# Patient Record
Sex: Female | Born: 1975
Health system: Southern US, Community
[De-identification: ages and names within clinical notes are randomized; demographics above are authoritative.]

## PROBLEM LIST (undated history)

## (undated) DIAGNOSIS — I10 Essential (primary) hypertension: Secondary | ICD-10-CM

## (undated) DIAGNOSIS — G43909 Migraine, unspecified, not intractable, without status migrainosus: Secondary | ICD-10-CM

---

## 1997-10-28 ENCOUNTER — Emergency Department (HOSPITAL_COMMUNITY): Admission: EM | Admit: 1997-10-28 | Discharge: 1997-10-28 | Payer: Self-pay | Admitting: Emergency Medicine

## 2000-12-25 ENCOUNTER — Inpatient Hospital Stay (HOSPITAL_COMMUNITY): Admission: AD | Admit: 2000-12-25 | Discharge: 2000-12-25 | Payer: Self-pay | Admitting: Obstetrics

## 2001-01-17 ENCOUNTER — Inpatient Hospital Stay (HOSPITAL_COMMUNITY): Admission: AD | Admit: 2001-01-17 | Discharge: 2001-01-17 | Payer: Self-pay | Admitting: Obstetrics

## 2001-01-18 ENCOUNTER — Encounter: Payer: Self-pay | Admitting: Obstetrics

## 2001-03-02 ENCOUNTER — Ambulatory Visit (HOSPITAL_COMMUNITY): Admission: RE | Admit: 2001-03-02 | Discharge: 2001-03-02 | Payer: Self-pay | Admitting: *Deleted

## 2001-04-01 ENCOUNTER — Inpatient Hospital Stay (HOSPITAL_COMMUNITY): Admission: AD | Admit: 2001-04-01 | Discharge: 2001-04-01 | Payer: Self-pay | Admitting: *Deleted

## 2001-07-19 ENCOUNTER — Inpatient Hospital Stay (HOSPITAL_COMMUNITY): Admission: AD | Admit: 2001-07-19 | Discharge: 2001-07-19 | Payer: Self-pay | Admitting: Obstetrics

## 2001-07-29 ENCOUNTER — Encounter (HOSPITAL_COMMUNITY): Admission: RE | Admit: 2001-07-29 | Discharge: 2001-08-08 | Payer: Self-pay | Admitting: *Deleted

## 2001-08-05 ENCOUNTER — Inpatient Hospital Stay (HOSPITAL_COMMUNITY): Admission: AD | Admit: 2001-08-05 | Discharge: 2001-08-05 | Payer: Self-pay | Admitting: *Deleted

## 2001-08-06 ENCOUNTER — Inpatient Hospital Stay (HOSPITAL_COMMUNITY): Admission: AD | Admit: 2001-08-06 | Discharge: 2001-08-08 | Payer: Self-pay | Admitting: *Deleted

## 2001-10-28 ENCOUNTER — Emergency Department (HOSPITAL_COMMUNITY): Admission: EM | Admit: 2001-10-28 | Discharge: 2001-10-28 | Payer: Self-pay | Admitting: Emergency Medicine

## 2004-01-10 ENCOUNTER — Inpatient Hospital Stay (HOSPITAL_COMMUNITY): Admission: AD | Admit: 2004-01-10 | Discharge: 2004-01-12 | Payer: Self-pay | Admitting: Obstetrics

## 2009-05-16 ENCOUNTER — Ambulatory Visit: Payer: Self-pay | Admitting: Obstetrics and Gynecology

## 2009-05-21 ENCOUNTER — Ambulatory Visit (HOSPITAL_COMMUNITY): Admission: RE | Admit: 2009-05-21 | Discharge: 2009-05-21 | Payer: Self-pay | Admitting: Obstetrics and Gynecology

## 2009-06-19 ENCOUNTER — Ambulatory Visit (HOSPITAL_COMMUNITY): Admission: RE | Admit: 2009-06-19 | Discharge: 2009-06-19 | Payer: Self-pay | Admitting: Obstetrics and Gynecology

## 2009-06-19 ENCOUNTER — Ambulatory Visit: Payer: Self-pay | Admitting: Obstetrics and Gynecology

## 2010-10-21 LAB — CBC
HCT: 40.6 % (ref 36.0–46.0)
Hemoglobin: 13.8 g/dL (ref 12.0–15.0)
MCHC: 33.9 g/dL (ref 30.0–36.0)
MCV: 88.8 fL (ref 78.0–100.0)
Platelets: 312 10*3/uL (ref 150–400)
RBC: 4.57 MIL/uL (ref 3.87–5.11)
RDW: 13.8 % (ref 11.5–15.5)
WBC: 6.5 10*3/uL (ref 4.0–10.5)

## 2010-10-21 LAB — PREGNANCY, URINE: Preg Test, Ur: NEGATIVE

## 2011-05-02 ENCOUNTER — Emergency Department (HOSPITAL_COMMUNITY)
Admission: EM | Admit: 2011-05-02 | Discharge: 2011-05-02 | Disposition: A | Discharge status: Home / Self Care | Payer: Self-pay | Attending: Emergency Medicine | Admitting: Emergency Medicine

## 2011-05-02 ENCOUNTER — Emergency Department (HOSPITAL_COMMUNITY): Payer: Self-pay

## 2011-05-02 DIAGNOSIS — G43909 Migraine, unspecified, not intractable, without status migrainosus: Secondary | ICD-10-CM | POA: Insufficient documentation

## 2012-04-08 ENCOUNTER — Ambulatory Visit: Payer: Self-pay | Admitting: Family Medicine

## 2012-04-08 VITALS — BP 118/74 | HR 83 | Temp 98.3°F | Resp 16 | Ht 62.0 in | Wt 177.0 lb

## 2012-04-08 DIAGNOSIS — J0110 Acute frontal sinusitis, unspecified: Secondary | ICD-10-CM

## 2012-04-08 DIAGNOSIS — R05 Cough: Secondary | ICD-10-CM

## 2012-04-08 DIAGNOSIS — Z23 Encounter for immunization: Secondary | ICD-10-CM

## 2012-04-08 MED ORDER — LORATADINE-PSEUDOEPHEDRINE ER 5-120 MG PO TB12: ORAL_TABLET | ORAL | Status: DC

## 2012-04-08 MED ORDER — BENZONATATE 100 MG PO CAPS: ORAL_CAPSULE | ORAL | Status: DC

## 2012-04-08 MED ORDER — AMOXICILLIN 500 MG PO CAPS: ORAL_CAPSULE | ORAL | Status: DC

## 2012-04-08 NOTE — Patient Instructions (Addendum)
1. Sinusitis, acute frontal  amoxicillin (AMOXIL) 500 MG capsule, loratadine-pseudoephedrine (CLARITIN-D 12 HOUR) 5-120 MG per tablet  2. Cough  benzonatate (TESSALON PERLES) 100 MG capsule  3. Need for prophylactic vaccination and inoculation against influenza  Flu vaccine greater than or equal to 36yo preservative free IM   Sinusitis Sinuses are air pockets within the bones of your face. The growth of bacteria within a sinus leads to infection. The infection prevents the sinuses from draining. This infection is called sinusitis. SYMPTOMS  There will be different areas of pain depending on which sinuses have become infected.  The maxillary sinuses often produce pain beneath the eyes.   Frontal sinusitis may cause pain in the middle of the forehead and above the eyes.  Other problems (symptoms) include:  Toothaches.   Colored, pus-like (purulent) drainage from the nose.   Swelling, warmth, and tenderness over the sinus areas may be signs of infection.  TREATMENT  Sinusitis is most often determined by an exam.X-rays may be taken. If x-rays have been taken, make sure you obtain your results or find out how you are to obtain them. Your caregiver may give you medications (antibiotics). These are medications that will help kill the bacteria causing the infection. You may also be given a medication (decongestant) that helps to reduce sinus swelling.  HOME CARE INSTRUCTIONS   Only take over-the-counter or prescription medicines for pain, discomfort, or fever as directed by your caregiver.   Drink extra fluids. Fluids help thin the mucus so your sinuses can drain more easily.   Applying either moist heat or ice packs to the sinus areas may help relieve discomfort.   Use saline nasal sprays to help moisten your sinuses. The sprays can be found at your local drugstore.  SEEK IMMEDIATE MEDICAL CARE IF:  You have a fever.   You have increasing pain, severe headaches, or toothache.   You have  nausea, vomiting, or drowsiness.   You develop unusual swelling around the face or trouble seeing.  MAKE SURE YOU:   Understand these instructions.   Will watch your condition.   Will get help right away if you are not doing well or get worse.  Document Released: 07/06/2005 Document Revised: 06/25/2011 Document Reviewed: 02/02/2007 [facility] Patient Information 2012 [place], [place].

## 2012-04-08 NOTE — Progress Notes (Signed)
   [address]   [place], [place]  [address]   [phone]  Subjective:    Patient ID: Sharon Johnson, female    DOB: June 22, 1976, 36 y.o.   MRN: [id number]  HPI This 36 y.o. female presents for evaluation of cough, rhinorrhea.  Onset ten days ago.  No fever +chills, +sweats.  +HA; L ear pain; +ST; +rhinorrhea; +nasal congestion; yellow drainage; +PND; +coughing; +nausea; no SOB; no vomiting but +dry heaves.  No diarrhea.  No rash.  +sneezing some.  Sudafed, Contact Cold and Flu and Mucinex Cold.   Diffuse sore throat; no pain with talking; mild pain with swallowing.    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    Review of Systems  Constitutional: Negative for fever, chills, diaphoresis and fatigue.  HENT: Positive for ear pain, congestion, sore throat, rhinorrhea, sneezing, voice change and postnasal drip. Negative for hearing loss and trouble swallowing.   Eyes: Negative for pain, discharge and itching.  Respiratory: Positive for cough. Negative for shortness of breath and wheezing.   Gastrointestinal: Positive for nausea. Negative for vomiting and diarrhea.  Neurological: Positive for dizziness and headaches. Negative for syncope, weakness, light-headedness and numbness.       Objective:   Physical Exam  Constitutional: She is oriented to person, place, and time. She appears well-developed and well-nourished.  HENT:  Head: Normocephalic and atraumatic.  Right Ear: External ear normal.  Left Ear: External ear normal.  Nose: Nose normal.  Mouth/Throat: Oropharynx is clear and moist. No oropharyngeal exudate.       B HYPERTROPHIED TONSILS WITHOUT DEVIATION OR EXUDATE.  Eyes: Conjunctivae normal and EOM are normal. Pupils are equal, round, and reactive to light.  Neck: Normal range of motion. Neck supple. No thyromegaly present.  Cardiovascular: Normal rate, regular rhythm,  normal heart sounds and intact distal pulses.   Pulmonary/Chest: Effort normal and breath sounds normal. No respiratory distress. She has no wheezes. She has no rales.  Lymphadenopathy:    She has cervical adenopathy.  Neurological: She is alert and oriented to person, place, and time. No cranial nerve deficit. She exhibits normal muscle tone.  Skin: Skin is warm and dry. No rash noted.  Psychiatric: She has a normal mood and affect. Her behavior is normal. Judgment and thought content normal.       Assessment & Plan:   1. Sinusitis, acute frontal  amoxicillin (AMOXIL) 500 MG capsule, loratadine-pseudoephedrine (CLARITIN-D 12 HOUR) 5-120 MG per tablet  2. Cough  benzonatate (TESSALON PERLES) 100 MG capsule  3. Need for prophylactic vaccination and inoculation against influenza  Flu vaccine greater than or equal to 3yo preservative free IM     1.  Acute frontal Sinusitis:  New.  Rx for Amoxicillin, Claritin D provided.  Recommend supportive care with rest, fluids, Tylenol or Motrin.   2.  Cough:  New.  Rx for [employer] provided.   3.  Flu vaccine administered.

## 2012-04-11 NOTE — Progress Notes (Signed)
Reviewed and agree.

## 2014-12-25 ENCOUNTER — Ambulatory Visit: Payer: Self-pay

## 2015-01-01 ENCOUNTER — Ambulatory Visit: Payer: Self-pay | Attending: Internal Medicine

## 2015-06-29 ENCOUNTER — Ambulatory Visit (INDEPENDENT_AMBULATORY_CARE_PROVIDER_SITE_OTHER): Payer: Self-pay | Admitting: Internal Medicine

## 2015-06-29 VITALS — BP 110/80 | HR 58 | Temp 98.8°F | Resp 16 | Ht 62.0 in | Wt 167.1 lb

## 2015-06-29 DIAGNOSIS — S46811A Strain of other muscles, fascia and tendons at shoulder and upper arm level, right arm, initial encounter: Secondary | ICD-10-CM

## 2015-06-29 DIAGNOSIS — J0100 Acute maxillary sinusitis, unspecified: Secondary | ICD-10-CM

## 2015-06-29 MED ORDER — AMOXICILLIN 875 MG PO TABS: 875.0000 mg | ORAL_TABLET | Freq: Two times a day (BID) | ORAL | Status: DC

## 2015-06-29 NOTE — Patient Instructions (Signed)
Ask pharmacy for 12hour sudafed to take twice a day for 5-7 daysParlisis del trapecio con rehabilitacin (Trapezius Palsy With Rehab) El trapecio es un msculo grande que se encuentra en la zona superior de la espalda y que ayuda a Chief Operating Officer el omplato y a Building services engineer columna vertebral. La parlisis del trapecio es una afeccin del sistema nervioso en el msculo trapecio. Esta afeccin determina dolor y debilidad en la parte posterior del hombro y la regin superior de la espalda. Tambin est comprometida la funcin del hombro, debido a que la escpula alberga la articulacin del hombro La parlisis del trapecio es causada por una lesin del nervio espinal accesorio, el cual se conecta con el msculo trapecio. SNTOMAS  Dolor continuo en el hombro o en la zona superior de la espalda.  Disminucin de la funcin del codo y de la fuerza.  El omplato protrude (omplato en forma de ala).  Dolor en la espalda al sentarse en una silla con respaldo duro, debido a la protrusin del omplato que presiona en la silla.  Atrofia del trapecio, en la que puede verse o no asimetra del cuello.  El hombro est cado. CAUSAS La causa es una lesin en el nervio accesorio, que se conecta con el msculo trapecio. Generalmente esta afeccin se asocia a Customer service manager (los huesos adyacentes salen de su alineacin, pero las superficies de las articulaciones estn en contacto). Los mecanismos ms frecuentes de una lesin son:  Engineer, technical sales (como al golpearse contra un objeto o caer sobre el hombro).  Complicaciones de [place] previa que causa una lesin en el nervio. LOS RIESGOS AUMENTAN CON:  En los deportes de contacto (ftbol americano, rugby).  Ciruga cerca del cuello.  Poca fuerza y flexibilidad. PREVENCIN  Precalentamiento adecuado y elongacin antes de la [place].  Mantener la forma fsica:  E[place], flexibilidad y resistencia  muscular.  Capacidad cardiovascular. PRONSTICO Esta afeccin generalmente se resuelve espontneamente dentro de los 3 a 6 meses. El tratamiento no quirrgico (conservador) puede ayudar a Conservator, museum/gallery intensidad de los sntomas.  COMPLICACIONES RELACIONADAS  Dao permanente que incluye dolor, adormecimiento, hormigueo o debilidad.  Imposibilidad de participar en competencias de alto nivel.  La recurrencia frecuente de los sntomas puede dar como resultado un problema crnico.  Rigidez del hombro. TRATAMIENTO El tratamiento inicial consiste en descansar de actividades que lo agravan y el uso de medicamentos y la aplicacin de hielo para Engineer, materials y reducir la hinchazn. Los ejercicios de elongacin y fortalecimiento pueden ayudar a reducir Chief Technology Officer que aparece con la [place]. Los ejercicios pueden Management consultant o con un terapeuta. Un terapeuta podr indicarle un tratamiento ms intensivo:  El uso de corriente elctrica para Risk manager los nervios (estimulacin nerviosa elctrica transcutnea - TENS)  Ultrasonido Si los sntomas son graves, el mdico podr indicarle que utilice un soporte ortopdico para Paramedic las [place]. Si los sntomas continan durante ms de 6 meses a pesar del tratamiento no quirrgico, se Public relations account executive. MEDICAMENTOS  Si necesita analgsicos, se recomiendan los antiinflamatorios no esteroides, como aspirina e ibuprofeno y otros calmantes menores, como acetaminofeno  No tome medicamentos para Chief Technology Officer dentro de los [address] previos a la [place].  Los analgsicos prescriptos se indicarn si el mdico lo considera necesario. Utilcelos como se le indique y slo cuando lo necesite. CALOR Y FRO   El tratamiento con fro [employer] y reduce la inflamacin. El fro debe aplicarse durante 10 a  15 minutos cada 2  3 horas para reducir la inflamacin y Chief Technology Officer e inmediatamente despus de cualquier actividad que agrava los sntomas. Utilice  bolsas de hielo o masajee la zona con un trozo de hielo (masaje de hielo).  El calor puede usarse antes de Therapist, music y de las actividades de fortalecimiento indicadas por el profesional, le fisioterapeuta o Orthoptist. Utilice una bolsa trmica o sumerja la lesin en agua caliente. SOLICITE ATENCIN MDICA SI:  El tratamiento parece no ofrecer benficios, o el trastorno [employer].  Algn medicamento le produce [employer]. EJERCICIOS EJERCICIOS DE AMPLITUD DE MOVIMIENTOS Y ELONGACIN - Parlisis del trapecio (parlisis del nervio espinal accesorio). Estos ejercicios lo ayudarn al comienzo de la rehabilitacin. Los sntomas podrn aliviarse con o sin asistencia adicional de su mdico, fisioterapeuta o Herbalist. Al completar estos ejercicios, recuerde:   Restaurar la flexibilidad del tejido ayuda a que las articulaciones recuperen el movimiento normal. Esto permite que el movimiento y la actividad sea ms saludables y menos dolorosos.  Para que sea efectiva, cada elongacin debe realizarse durante al menos 30 segundos.  La elongacin nunca debe ser dolorosa. Deber sentir slo un alargamiento o distensin suave del tejido que estira. ELONGACIN - Flexin de pie  Pngase de pie con una buena postura. Tome un palo de escoba o caa con una palma derecha / izquierdo hacia abajo y la otra palma hacia arriba de modo que sus manos tengan una separacin de un poco ms que el ancho de sus hombros.  Mantenga su codo derecha / izquierdo recto y los msculos de los hombros [place], y [place] palo con su manos opuesta para elevar su brazo derecha / izquierdo frente a su cuerpo y [employer] cabeza. Eleve el brazo [employer] sentir un estiramiento en su hombro derecha / izquierdo, pero detngase antes de sentir que Teacher, music.  Evite encoger su hombro derecha / izquierdo cuando eleve el brazo manteniendo el omplato hacia abajo y hacia la columna vertebral en la zona media de la espalda.  Mantenga esta posicin durante __________ segundos.  Vuelva lentamente a la posicin inicial. Reptalo __________ veces. Realice este ejercicio __________ veces por da.  ELONGACIN - Abduccin posicin supina  Pngase de pie con una buena postura. Tome un palo de escoba o caa con una palma derecha / izquierdo hacia abajo y la otra palma hacia arriba de modo que sus manos tengan una separacin de un poco ms que el ancho de sus hombros.  Mantenga su codo derecha / izquierdo recto y los msculos de los hombros [place], y [place] con su mano opuesta para elevar su brazo derecha / izquierdo hacia un lado de su cuerpo y [employer] la cabeza. Eleve el brazo [employer] sentir un estiramiento en su hombro derecha / izquierdo, pero detngase antes de sentir que Teacher, music.  Evite encoger su hombro derecha / izquierdo cuando eleve el brazo manteniendo el omplato hacia abajo y hacia la columna vertebral en la zona media de la espalda. Mantenga esta posicin durante __________ segundos.  Vuelva lentamente a la posicin inicial. Reptalo __________ veces. Realice este ejercicio __________ veces por da.  AMPLITUD DE MOVIMIENTOS - Flexin activa - asistida  Acustese sobre la espada. Puede doblar las rodillas para estar ms cmodo.  Tome un palo de escoba o un bastn de modo que sus manos queden a la misma distancia que sus hombros. Su mano derecha / izquierdo debe asir un extremo del palo de modo que quede colocada  con el pulgar hacia arriba, como si fuera a [employer].  Usando su brazo sano como gua, eleve su brazo Sales executive / izquierdo sobre la cabeza Teacher, adult education sentir un suave estiramiento en el hombro. Mantenga esta posicin durante __________ segundos.  Use el palo para ayudar a su brazo derecha / izquierdo a volver a Architect. Reptalo __________ veces. Realice este ejercicio __________ veces por da.  ELONGACIN - Rotacin externa y abduccin  Afrmese en el marco de  [place]. No importa cul pie coloca adelante.  Elija una de las siguientes posiciones segn le haya indicado el mdico, el fisioterapeuta o Orthoptist. coloque la manos:  Y antebrazos por arriba de la cabeza y [place] marco de la puerta.  Y antebrazos a la altura de la cabeza y [place] marco de la puerta.  A la altura del codo y [place] marco de la puerta.  Manteniendo la cabeza y el pecho erguidos y los msculos del estmago tensos para Automotive engineer la sobreextensin de la cintura, lleve lentamente el peso al pie que est por delante hasta sentir un estiramiento en el trax y en la zona anterior de los hombros.  Mantenga esta posicin durante __________ segundos. Lleve el peso hacia el pie que est por detrs para Higher education careers adviser. Reptalo __________ veces. Realice este estiramiento __________ [place] por da.  EJERCICIOS DE FORTALECIMIENTO Parlisis del trapecio (parlisis del nervio espinal accesorio) Estos ejercicios lo ayudarn al comienzo de la rehabilitacin. Los sntomas podrn aliviarse con o sin asistencia adicional de su mdico, fisioterapeuta o Herbalist. Al completar estos ejercicios, recuerde:   Los msculos pueden ganar tanto la resistencia como la fuerza que necesita para sus actividades diarias a travs de ejercicios controlados.  Realice los ejercicios como se lo indic el mdico, el fisioterapeuta o Orthoptist. [place] slo con los ejercicios de resistencia y haga las repeticiones que su mdico le indique.  Podr experimentar dolor o cansancio muscular, pero el dolor o la molestia que trata de mejorar a travs de los ejercicios nunca debe empeorar. Si el dolor empeora, detngase y asegrese de que est siguiendo las directivas correctamente. Si an siente dolor luego de Education officer, environmental lo ajustes necesarios, deber discontinuar el ejercicio hasta que pueda conversar con el profesional sobre el problema. FUERZA - Depresin y aduccin escapular  Sintese con una buena  postura en una silla firme. Apoye los brazos delante de usted, con [place], reposabrazos, o sobre una mesa. Mantenga los codos en lnea con los lados de su cuerpo.  Lleve suavemente los omplatos hacia abajo y [place] su zona media de la columna vertebral. Aumente gradualmente la tensin, sin tensar los msculos de la parte superior de los hombros y la parte posterior de su cuello.  Mantenga esta posicin durante __________ segundos. Libere lentamente la tensin y relaje los msculos antes de iniciar la siguiente repeticin.  Despus de haber practicado este ejercicio, quite el soporte del brazo y complete el ejercicio de pie, y tambin en la posicin de sentado. Reptalo __________ veces. Realice este ejercicio __________ veces por da.  FUERZA - Abductores del hombro - isomtrica  Prese o sintese en buena postura a 4 a 6 pulgadas de la pared con su lado misc216enfrentando a la misma.  Doble el codo derecha / izquierdo. Presione suavemente su codo derecha / izquierdo contra la pared. Aumente gradualmente la presin [employer] pueda sin llegar a encojer el hombro ni Tax inspector.  Mantenga esta posicin durante __________ segundos.  Disminuya  la tensin lentamente. Relaje los msculos de los hombros completamente antes de repetir. Reptalo __________ veces. Realice este ejercicio __________ veces por da.  FUERZA - Flexin del hombro - isomtrica  Manteniendo una buena postura y enfrentando una pared, prese o sintese a 4 a 6 pulgadas de la misma.  Manteniendo su codo derecha / izquierdo derecho, presione suavemente la parte superior del puo contra la pared. Aumente gradualmente la presin [employer] pueda sin llegar a encojer el hombro ni Tax inspector.  Mantenga esta posicin durante __________ segundos.  Disminuya la tensin lentamente. Relaje los msculos de los hombros completamente antes de repetir. Reptalo __________ veces. Realice este ejercicio __________ veces por  da.  FUERZA - Rotadores internos  Asegure una banda o tubo de goma a un objeto fijo de modo que quede a la misma altura que su codo Sales executive / izquierdo Engineer, manufacturing systems se encuentre de pie o sentado sobre una superficie firme.  Prese o sintese de modo que la banda de goma se encuentre a su lado derecha / izquierdo.  Doble el codo a 90 grados. Coloque una toalla doblada o una pequea almohada debajo de su brazo derecha / izquierdo de modo que su codo quede algunas pulgadas separado de su cuerpo.  Manteniendo la tensin en la banda, tire de la misma cruzando sobre su cuerpo, [place]. Asegrese de [employer] su cuerpo derecho, de modo que el movimiento slo provenga de la rotacin del hombro.  Mantenga esta posicin durante __________ segundos. Libere la tensin de modo controlado mientras vuelve a la posicin inicial. Reptalo __________ veces. Realice este ejercicio __________ veces por da.  FUERZA - Rotadores externos  Asegure una banda o tubo de goma a un objeto fijo de modo que quede a la misma altura que su codo Sales executive / izquierdo Engineer, manufacturing systems se encuentre de pie o sentado sobre una superficie firme.  Prese o sintese de [employer]	[place] banda de goma se encuentre de su lado no lesionado.  Doble el codo a 90 grados. Coloque una toalla doblada o una pequea almohada debajo de su brazo derecha / izquierdo de modo que su codo quede algunas pulgadas separado de su cuerpo.  Manteniendo la tensin en la banda de goma, tire de la misma hacia afuera de su cuerpo, como pivoteando en el codo. Asegrese de [employer] su cuerpo derecho, de modo que el movimiento slo provenga de la rotacin del hombro.  Mantenga esta posicin durante __________ segundos. Libere la tensin de modo controlado mientras vuelve a la posicin inicial. Reptalo __________ veces. Realice este ejercicio __________ veces por da.  FUERZA - Extensores del hombro  Asegure una banda o tubo de goma a un objeto fijo (mesa, columna) de  modo que quede a la misma altura que sus hombros mientras se encuentre de pie o sentado en una silla firme sin apoyabrazos.  Con los pulgares [place], tome un extremo de la banda con cada mano. Enderece los codos y levante las manos directamente frente usted, a la altura de los hombros. Camine hacia atrs, alejndose del extremo fijo de la banda, [place] que se tense.  Presionando los hombros para juntarlos, [employer] a los lados de los muslos. No deje que las manos vayan ms all.  Mantenga esta posicin durante __________ segundos. Lentamente libere la tensin de la banda, volviendo a la posicin inicial. Reptalo __________ veces. Realice este ejercicio __________ veces por da.  FUERZA - Extensores del hombro posicin prona  Recustese en el estmago sobre [place]  superficie firme de modo que el brazo derecha / izquierdo cuelgue por el borde. Apoye la frente sobre el brazo opuesto. Con el pulgar apuntando hacia afuera del cuerpo y el hombro extendido, sostenga un peso de __________ [employer]  Presione el omplato derecha / izquierdo Restaurant manager, fast food de la espalda y luego eleve lentamente el brazo por detrs hasta la altura de la cama.  Mantenga esta posicin durante __________ segundos. Invierta lentamente las direcciones y vuelva a la posicin inicial, controlando el peso mientras baja el brazo. Reptalo __________ veces. Realice este ejercicio __________ veces por da.  FUERZA - Aductores horizontales Elija una de las [place] posiciones para Primary school teacher ejercicio. Boca abajo: acostado sobre el estmago:  Recustese en el estmago sobre una superficie firme de modo que el brazo derecha / izquierdo cuelgue por el borde. Apoye la frente sobre el brazo opuesto. Con la palma apuntando hacia el suelo y el codo extendido, sostenga un peso de __________ [employer]  Presione el omplato derecha / izquierdo Restaurant manager, fast food de la espalda y luego eleve lentamente el brazo hasta la  altura de la cama.  Mantenga esta posicin durante __________ segundos. Invierta lentamente las direcciones y vuelva a la posicin inicial, controlando el peso mientras baja el brazo. Reptalo __________ veces. Realice este ejercicio __________ veces por da. De pie:   Asegure una banda o tubo de goma a un objeto fijo (mesa, columna) de modo que quede a la misma altura que sus hombros mientras se encuentre de pie o sentado en una silla firme sin apoyabrazos.  Sostenga un extremo de Scientist, product/process development en cada mano y haga que las palmas se enfrenten entre s. Enderece los codos y levante las manos directamente frente usted, a la altura de los hombros. Camine hacia atrs, alejndose del extremo fijo de la banda, [place] que se tense.  Apriete ambos omplatos como para que se junten. Mantenga los codos trabados y las manos a la altura del Chief Strategy Officer, y [employer].  Mantenga esta posicin durante __________ segundos. Lentamente libere la tensin de la banda, volviendo a la posicin inicial. Reptalo __________ veces. Realice este ejercicio __________ veces por da. FUERZA - Retractores y elevadores escapulares  Asegure una banda o tubo de goma a un objeto fijo (mesa, columna) de modo que quede a la misma altura que sus hombros mientras se encuentre de pie o sentado en una silla firme sin apoyabrazos.  Con los pulgares [place], tome un extremo de la banda con cada mano. Camine hacia atrs, alejndose del extremo fijo de la banda, [place] que se tense.  Mantenga los omplatos juntos, estire los codos y [employer] manos por sobre la cabeza.  Mantenga esta posicin durante __________ segundos. Lentamente libere la tensin de la banda, volviendo a la posicin inicial. Reptalo __________ veces. Realice este ejercicio __________ veces por da.    Esta informacin no tiene Theme park manager el consejo del mdico. Asegrese de hacerle al mdico cualquier pregunta que tenga.   Document  Released: 12/23/2007 Document Revised: 11/20/2014 Elsevier Interactive Patient Education 2016 Elsevier Inc. Trapezius Palsy With Rehab The trapezius is a large muscle of the upper back that helps to control the shoulder blade (scapula) and stabilize the spine. Trapezius palsy is a condition affecting the nervous system in the trapezius muscle. The condition results in pain and weakness in the back of the shoulder and upper back. Shoulder function is also decreased, because the scapula contains  the socket for "ball and-socket" joint of the shoulder. Trapezius palsy is caused by injury to the spinal accessory nerve, which connects to the trapezius muscle. SYMPTOMS   Pain that is achy and in the shoulder and/or upper back.  Decreased shoulder function and/or strength.  Scapula protrudes (winging of the scapula).  Back pain when sitting in a chair with a hard back due to the scapula winging and placing pressure on the back of the chair.  Shrinkage (atrophy) of the trapezius muscle, this may or may not make the neckline look asymmetric.  Shoulder drooping. CAUSES  Trapezius palsy is caused by damage to the spinal accessory nerve, which is connected to the trapezius muscle. This condition is often associated with acromioclavicular [facility]) or sternoclavicular subluxation (adjacent bones becoming out of proper alignment, but the joint surfaces are still touching). Common mechanisms of injury include:  Direct trauma to the shoulder (like being hit with an object or falling on the shoulder).  Complication of previous surgery that causes nerve damage. RISK INCREASES WITH:  Contact sports (i.e., football, rugby, or lacrosse).  Surgery around the neck.  Poor strength and flexibility. PREVENTION   Warm up and stretch properly before activity.  Maintain physical fitness:  Strength, flexibility, and endurance.  Cardiovascular fitness. PROGNOSIS  Trapezius palsy usually resolves spontaneously  within 3 to 6 months. Nonsurgical (conservative) treatments may help decrease the severity of symptoms.  RELATED COMPLICATIONS   Permanent nerve damage, including pain, numbness, tingling, or weakness.  Inability to compete at a high level.  Recurrent symptoms that result in a chronic problem.  Shoulder stiffness. TREATMENT Treatment initially involves resting from any activities that aggravate the symptoms, and the use of ice and medications to help reduce pain and inflammation. The use of strengthening and stretching exercises may help reduce pain with activity. These exercises may be performed at home or with referral to a therapist. A therapist may recommend further treatment, such as:  The use of electric current to simulate the nerves (transcutaneous electronic nerve stimulation [TENS]).  Ultrasound. If symptoms are severe, your caregiver may recommend you wear a brace to decrease discomfort. If symptoms persist for more than 6 months despite nonsurgical treatment, surgery may be recommended (uncommon). MEDICATION   If pain medication is necessary, then nonsteroidal anti-inflammatory medications, such as aspirin and ibuprofen, or other minor pain relievers, such as acetaminophen, are often recommended.  Do not take pain medication for 7 days before surgery.  Prescription pain relievers may be given if deemed necessary by your caregiver. Use only as directed and only as much as you need. HEAT AND COLD  Cold treatment (icing) relieves pain and reduces inflammation. Cold treatment should be applied for 10 to 15 minutes every 2 to 3 hours for inflammation and pain and immediately after any activity that aggravates your symptoms. Use ice packs or massage the area with a piece of ice (ice massage).  Heat treatment may be used prior to performing the stretching and strengthening activities prescribed by your caregiver, physical therapist, or athletic trainer. Use a heat pack or soak your  injury in warm water. SEEK MEDICAL CARE IF:  Treatment seems to offer no benefit, or the condition worsens.  Any medications produce adverse side effects. EXERCISES RANGE OF MOTION (ROM) AND STRETCHING EXERCISES - Trapezius Palsy (Spinal Accessory Nerve Palsy) These exercises may help you when beginning to rehabilitate your injury. Your symptoms may resolve with or without further involvement from your physician, physical therapist or athletic trainer. While  completing these exercises, remember:   Restoring tissue flexibility helps normal motion to return to the joints. This allows healthier, less painful movement and activity.  An effective stretch should be held for at least 30 seconds.  A stretch should never be painful. You should only feel a gentle lengthening or release in the stretched tissue. STRETCH - Flexion, Standing  Stand with good posture. With an underhand grip on your right / left and an overhand grip on the opposite hand, grasp a broomstick or cane so that your hands are a little more than shoulder-width apart.  Keeping your right / left elbow straight and shoulder muscles relaxed, push the stick with your opposite hand to raise your right / left arm in front of your body and then overhead. Raise your arm until you feel a stretch in your right / left shoulder, but before you have increased shoulder pain.  Try to avoid shrugging your right / left shoulder as your arm rises by keeping your shoulder blade tucked down and toward your mid-back spine. Hold __________ seconds.  Slowly return to the starting position. Repeat __________ times. Complete this exercise __________ times per day.  STRETCH - Abduction, Supine  Stand with good posture. With an underhand grip on your right / left and an overhand grip on the opposite hand, grasp a broomstick or cane so that your hands are a little more than shoulder-width apart.  Keeping your right / left elbow straight and shoulder  muscles relaxed, push the stick with your opposite hand to raise your right / left arm out to the side of your body and then overhead. Raise your arm until you feel a stretch in your right / left shoulder, but before you have increased shoulder pain.  Try to avoid shrugging your right / left shoulder as your arm rises by keeping your shoulder blade tucked down and toward your mid-back spine. Hold __________ seconds.  Slowly return to the starting position. Repeat __________ times. Complete this exercise __________ times per day.  ROM - Flexion, Active-Assisted  Lie on your back. You may bend your knees for comfort.  Grasp a broomstick or cane so your hands are about shoulder-width apart. Your right / left hand should grip the end of the stick/cane so that your hand is positioned "thumbs-up," as if you were about to shake hands.  Using your healthy arm to lead, raise your right / left arm overhead until you feel a gentle stretch in your shoulder. Hold __________ seconds.  Use the stick/cane to assist in returning your right / left arm to its starting position. Repeat __________ times. Complete this exercise __________ times per day.  STRETCH - External Rotation and Abduction  Stagger your stance through a doorframe. It does not matter which foot is forward.  Choose one of the following positions as instructed by your physician, physical therapist or athletic trainer: place your hands:  and forearms above your head and on the door frame.  and forearms at head-height and on the door frame.  at elbow-height and on the door frame.  Keeping your head and chest upright and your stomach muscles tight to prevent over-extending your low-back, slowly shift your weight onto your front foot until you feel a stretch across your chest and/or in the front of your shoulders.  Hold __________ seconds. Shift your weight to your back foot to release the stretch. Repeat __________ times. Complete this  stretch __________ times per day.  STRENGTHENING EXERCISES - Trapezius Palsy (  Spinal Accessory Nerve Palsy) These exercises may help you when beginning to rehabilitate your injury. They may resolve your symptoms with or without further involvement from your physician, physical therapist or athletic trainer. While completing these exercises, remember:   Muscles can gain both the endurance and the strength needed for everyday activities through controlled exercises.  Complete these exercises as instructed by your physician, physical therapist or athletic trainer. Progress with the resistance and repetition exercises only as your caregiver advises.  You may experience muscle soreness or fatigue, but the pain or discomfort you are trying to eliminate should never worsen during these exercises. If this pain does worsen, stop and make certain you are following the directions exactly. If the pain is still present after adjustments, discontinue the exercise until you can discuss the trouble with your clinician. STRENGTH - Scapular Depression and Adduction  With good posture, sit on a firm chair. Support your arms in front of you with pillows, arm rests or a table top. Have your elbows in line with the sides of your body.  Gently draw your shoulder blades down and toward your mid-back spine. Gradually increase the tension without tensing the muscles along the top of your shoulders and the back of your neck.  Hold for __________ seconds. Slowly release the tension and relax your muscles completely before completing the next repetition.  After you have practiced this exercise, remove the arm support and complete it while standing as well as sitting. Repeat __________ times. Complete this exercise __________ times per day.  STRENGTH - Shoulder Abductors, Isometric   With good posture, stand or sit about 4-6 inches from a wall with your right / left side facing the wall.  Bend your right / left elbow.  Gently press your right / left elbow into the wall. Increase the pressure gradually until you are pressing as hard as you can without shrugging your shoulder or increasing any shoulder discomfort.  Hold __________ seconds.  Release the tension slowly. Relax your shoulder muscles completely before you do the next repetition. Repeat __________ times. Complete this exercise __________ times per day.  STRENGTH - Shoulder Flexion, Isometric  With good posture and facing a wall, stand or sit about 4-6 inches away.  Keeping your right / left elbow straight, gently press the top of your fist into the wall. Increase the pressure gradually until you are pressing as hard as you can without shrugging your shoulder or increasing any shoulder discomfort.  Hold __________ seconds.  Release the tension slowly. Relax your shoulder muscles completely before you do the next repetition. Repeat __________ times. Complete this exercise __________ times per day.  STRENGTH - Internal Rotators  Secure a rubber exercise band/tubing to a fixed object so that it is at the same height as your right / left elbow when you are standing or sitting on a firm surface.  Stand or sit so that the secured exercise band/tubing is at your right / left side.  Bend your elbow 90 degrees. Place a folded towel or small pillow under your right / left arm so that your elbow is a few inches away from your side.  Keeping the tension on the exercise band/tubing, pull it across your body toward your abdomen. Be sure to keep your body steady so that the movement is only coming from your shoulder rotating.  Hold __________ seconds. Release the tension in a controlled manner as you return to the starting position. Repeat __________ times. Complete this exercise __________ times  per day.  STRENGTH - External Rotators  Secure a rubber exercise band/tubing to a fixed object so that it is at the same height as your right / left elbow when you  are standing or sitting on a firm surface.  Stand or sit so that the secured exercise band/tubing is at your side that is not injured.  Bend your elbow 90 degrees. Place a folded towel or small pillow under your right / left arm so that your elbow is a few inches away from your side.  Keeping the tension on the exercise band/tubing, pull it away from your body, as if pivoting on your elbow. Be sure to keep your body steady so that the movement is only coming from your shoulder rotating.  Hold __________ seconds. Release the tension in a controlled manner as you return to the starting position. Repeat __________ times. Complete this exercise __________ times per day.  STRENGTH - Shoulder Extensors  Secure a rubber exercise band/tubing so that it is at the height of your shoulders when you are either standing or sitting on a firm arm-less chair.  With a thumbs-up grip, grasp an end of the band/tubing in each hand. Straighten your elbows and lift your hands straight in front of you at shoulder height. Step back away from the secured end of band/tubing until it becomes tense.  Squeezing your shoulder blades together, pull your hands down to the sides of your thighs. Do not allow your hands to go behind you.  Hold for __________ seconds. Slowly ease the tension on the band/tubing as you reverse the directions and return to the starting position. Repeat __________ times. Complete this exercise __________ times per day.  STRENGTH - Shoulder Extensors, Prone  Lie on your stomach on a firm surface so that your right / left arm overhangs the edge. Rest your forehead on your opposite forearm. With your thumb facing away from your body and your elbow straight, hold a __________ weight in your hand.  Squeeze your right / left shoulder blade to your mid-back spine and then slowly raise your arm behind you to the height of the bed.  Hold for __________ seconds. Slowly reverse the directions and return to  the starting position, controlling the weight as you lower your arm. Repeat __________ times. Complete this exercise __________ times per day.  STRENGTH - Horizontal Abductors Choose one of the two oppositions to complete this exercise. Prone (lying on stomach):  Lie on your stomach on a firm surface so that your right / left arm overhangs the edge. Rest your forehead on your opposite forearm. With your palm facing the floor and your elbow straight, hold a __________ weight in your hand.  Squeeze your right / left shoulder blade to your mid-back spine and then slowly raise your arm to the height of the bed.  Hold for __________ seconds. Slowly reverse the directions and return to the starting position, controlling the weight as you lower your arm. Repeat __________ times. Complete this exercise __________ times per day. Standing:   Secure a rubber exercise band/tubing so that it is at the height of your shoulders when you are either standing or sitting on a firm arm-less chair.  Grasp an end of the band/tubing in each hand and have your palms face each other. Straighten your elbows and lift your hands straight in front of you at shoulder height. Step back away from the secured end of band/tubing until it becomes tense.  Squeeze your shoulder blades  together. Keeping your elbows locked and your hands at shoulder-height, bring your hands out to your side.  Hold __________ seconds. Slowly ease the tension on the band/tubing as you reverse the directions and return to the starting position. Repeat __________ times. Complete this exercise __________ times per day. STRENGTH - Scapular Retractors and Elevators  Secure a rubber exercise band/tubing so that it is at the height of your shoulders when you are either standing or sitting on a firm arm-less chair.  With a thumbs-up grip, grasp an end of the band/tubing in each hand. Step back away from the secured end of band/tubing until it becomes  tense.  Squeezing your shoulder blades together, straighten your elbows and lift your hands straight over your head.  Hold for __________ seconds. Slowly ease the tension on the band/tubing as you reverse the directions and return to the starting position. Repeat __________ times. Complete this exercise __________ times per day.    This information is not intended to replace advice given to you by your health care provider. Make sure you discuss any questions you have with your health care provider.   Document Released: 07/06/2005 Document Revised: 11/20/2014 Document Reviewed: 10/18/2008 Elsevier Interactive Patient Education [employer] Elsevier Inc.

## 2015-06-29 NOTE — Progress Notes (Signed)
Subjective:  This chart was scribed for [name], MD by Stann Oresung-Kai Tsai, Medical Scribe. This patient was seen in Room 10 and the patient's care was started at 12:09 PM.    Patient ID: Sharon Johnson, female    DOB: November 08, 1975, 39 y.o.   MRN: [id number] Chief Complaint  Patient presents with  . Sore Throat  . Ear Pain  . Neck Pain    HPI Sharon Johnson is a 39 y.o. female [facility] complaining of gradual onset sore throat with ear pain and neck pain that started last week. She states that it hurts to swallow with what feels like bumps in her tonsils. She's been having a dry coughs for about a month now. She's also noticed blisters on her lips 4 days ago. She denies any known allergies to drugs.   She also informs having some neck pain that started before the illness.  She does house cleaning. Her husband notes that she carries vacuum cleaner on her back.   There are no active problems to display for this patient.   Current outpatient prescriptions:  .  levonorgestrel (MIRENA) 20 MCG/24HR IUD, 1 each by Intrauterine route once., Disp: , Rfl:  .  Pseudoeph-Doxylamine-DM-APAP (NYQUIL PO), Take by mouth., Disp: , Rfl:   Review of Systems  Constitutional: Negative for fever, chills and fatigue.  HENT: Positive for ear pain, sore throat and trouble swallowing. Negative for voice change.   Respiratory: Positive for cough.   Gastrointestinal: Negative for nausea, vomiting, diarrhea and constipation.  Musculoskeletal: Positive for neck pain and neck stiffness.  Skin:       Blisters on lips      Objective:   Physical Exam  Constitutional: She is oriented to person, place, and time. She appears well-developed and well-nourished. No distress.  HENT:  Head: Normocephalic and atraumatic.  Right Ear: Tympanic membrane normal.  Left Ear: Tympanic membrane normal.  Nares purulent discharge, Throat post nasal drip  Eyes: EOM are normal. Pupils are  equal, round, and reactive to light.  Neck: Neck supple.  Cardiovascular: Normal rate.   Pulmonary/Chest: Effort normal and breath sounds normal. No respiratory distress. She has no wheezes.  Musculoskeletal: Normal range of motion.  Tender in her right trapezius   Lymphadenopathy:    She has cervical adenopathy (Tender 1+ cerv nodes).  Neurological: She is alert and oriented to person, place, and time.  Skin: Skin is warm and dry.  Psychiatric: She has a normal mood and affect. Her behavior is normal.  Nursing note and vitals reviewed.   BP 110/80 mmHg  Pulse 58  Temp(Src) 98.8 F (37.1 C) (Oral)  Resp 16  Ht 5\' 2"  (1.575 m)  Wt 167 lb 2 oz (75.807 kg)  BMI 30.56 kg/m2  SpO2 99%     Assessment & Plan:  Acute maxillary sinusitis, recurrence not specified  Trapezius strain, right, initial encounter  Meds ordered this encounter  Medications  . amoxicillin (AMOXIL) 875 MG tablet    Sig: Take 1 tablet (875 mg total) by mouth 2 (two) times daily.    Dispense:  20 tablet    Refill:  0  sudafed exercises   By signing my name below, I, Stann Oresung-Kai Tsai, attest that this documentation has been prepared under the direction and in the presence of Ellamae Siaobert Jeffrie Lofstrom, MD. Electronically Signed: Stann Oresung-Kai Tsai, Scribe. 06/29/2015 , 12:09 PM .  I have completed the patient encounter in its entirety as documented by the scribe, with  editing by me where necessary. Amonte Brookover P. Laney Pastor, M.D.

## 2015-11-16 ENCOUNTER — Emergency Department (HOSPITAL_COMMUNITY)
Admission: EM | Admit: 2015-11-16 | Discharge: 2015-11-17 | Disposition: A | Discharge status: Home / Self Care | Payer: Self-pay | Attending: Emergency Medicine | Admitting: Emergency Medicine

## 2015-11-16 ENCOUNTER — Encounter (HOSPITAL_COMMUNITY): Payer: Self-pay | Admitting: Emergency Medicine

## 2015-11-16 DIAGNOSIS — I10 Essential (primary) hypertension: Secondary | ICD-10-CM | POA: Insufficient documentation

## 2015-11-16 DIAGNOSIS — R51 Headache: Secondary | ICD-10-CM | POA: Insufficient documentation

## 2015-11-16 DIAGNOSIS — M5412 Radiculopathy, cervical region: Secondary | ICD-10-CM | POA: Insufficient documentation

## 2015-11-16 DIAGNOSIS — R110 Nausea: Secondary | ICD-10-CM | POA: Insufficient documentation

## 2015-11-16 HISTORY — DX: Essential (primary) hypertension: I10

## 2015-11-16 MED ORDER — PREDNISONE 20 MG PO TABS: ORAL_TABLET | ORAL | Status: DC

## 2015-11-16 MED ORDER — KETOROLAC TROMETHAMINE 60 MG/2ML IM SOLN
60.0000 mg | Freq: Once | INTRAMUSCULAR | Status: AC
  Administered 2015-11-17: 60 mg via INTRAMUSCULAR
  Filled 2015-11-16: qty 2

## 2015-11-16 MED ORDER — HYDROCODONE-ACETAMINOPHEN 5-325 MG PO TABS: 1.0000 | ORAL_TABLET | ORAL | Status: DC | PRN

## 2015-11-16 MED ORDER — DEXAMETHASONE SODIUM PHOSPHATE 10 MG/ML IJ SOLN
10.0000 mg | Freq: Once | INTRAMUSCULAR | Status: AC
  Administered 2015-11-17: 10 mg via INTRAMUSCULAR
  Filled 2015-11-16: qty 1

## 2015-11-16 MED ORDER — OXYCODONE-ACETAMINOPHEN 5-325 MG PO TABS
2.0000 | ORAL_TABLET | Freq: Once | ORAL | Status: AC
  Administered 2015-11-17: 2 via ORAL
  Filled 2015-11-16: qty 2

## 2015-11-16 MED ORDER — CYCLOBENZAPRINE HCL 10 MG PO TABS: 10.0000 mg | ORAL_TABLET | Freq: Three times a day (TID) | ORAL | Status: DC | PRN

## 2015-11-16 NOTE — ED Provider Notes (Signed)
CSN: [id number]     Arrival date & time 11/16/15  2212 History  By signing my name below, I, Marisue Humble, attest that this documentation has been prepared under the direction and in the presence of Gilda Crease, MD . Electronically Signed: Marisue Humble, Scribe. 11/16/2015. 11:45 PM.    Chief Complaint  Patient presents with  . Neck Pain  . Headache  . Nausea   The history is provided by the patient. A language interpreter was used.   HPI Comments:  Sharon Johnson is a 40 y.o. female with PMHx of HTN who presents to the Emergency Department complaining of chronic, moderate headache radiating down her neck to right shoulder and arm, worsening yesterday. Pain is worsened when she turns her head side-to-side. Pt reports associated nausea, left-side facial pain and left eye pain. She has taken Ibuprofen without relief. Pt reports she saw a doctor for her headache in 2014 and was told she was having symptoms of a stroke.   Past Medical History  Diagnosis Date  . Hypertension    History reviewed. No pertinent past surgical history. No family history on file. Social History  Substance Use Topics  . Smoking status: Never Smoker   . Smokeless tobacco: Never Used  . Alcohol Use: No   OB History    No data available     Review of Systems  Eyes: Positive for pain.  Gastrointestinal: Positive for nausea.  Musculoskeletal: Positive for myalgias, arthralgias and neck pain.  Neurological: Positive for headaches.  All other systems reviewed and are negative.  Allergies  Review of patient's allergies indicates no known allergies.  Home Medications   Prior to Admission medications   Medication Sig Start Date End Date Taking? Authorizing Provider  ibuprofen (ADVIL,MOTRIN) 800 MG tablet Take 800 mg by mouth every 8 (eight) hours as needed (for pain.).   Yes Historical Provider, MD  levonorgestrel (MIRENA) 20 MCG/24HR IUD 1 each by Intrauterine route once.   Yes  Historical Provider, MD  PRESCRIPTION MEDICATION Take 1 tablet by mouth daily. Patient takes an unknown medication for high blood pressure.   Yes Historical Provider, MD  amoxicillin (AMOXIL) 875 MG tablet Take 1 tablet (875 mg total) by mouth 2 (two) times daily. Patient not taking: Reported on 11/16/2015 06/29/15   Tonye Pearson, MD  cyclobenzaprine (FLEXERIL) 10 MG tablet Take 1 tablet (10 mg total) by mouth 3 (three) times daily as needed for muscle spasms. 11/16/15   Gilda Crease, MD  HYDROcodone-acetaminophen (NORCO/VICODIN) 5-325 MG tablet Take 1 tablet by mouth every 4 (four) hours as needed for moderate pain. 11/16/15   Gilda Crease, MD  predniSONE (DELTASONE) 20 MG tablet 3 tabs po daily x 3 days, then 2 tabs x 3 days, then 1.5 tabs x 3 days, then 1 tab x 3 days, then 0.5 tabs x 3 days 11/16/15   Gilda Crease, MD   BP 149/81 mmHg  Pulse 73  Temp(Src) 98.9 F (37.2 C) (Oral)  Resp 20  Ht  (1.575 m)  Wt 169 lb 9.6 oz (76.93 kg)  BMI 31.01 kg/m2  SpO2 97% Physical Exam  Constitutional: She is oriented to person, place, and time. She appears well-developed and well-nourished. No distress.  HENT:  Head: Normocephalic and atraumatic.  Right Ear: Hearing normal.  Left Ear: Hearing normal.  Nose: Nose normal.  Mouth/Throat: Oropharynx is clear and moist and mucous membranes are normal.  Eyes: Conjunctivae and EOM are normal. Pupils are  equal, round, and reactive to light.  Neck: Normal range of motion. Neck supple.  BL paraspinal neck muscle tenderness; pain with ROM  Cardiovascular: Regular rhythm, S1 normal and S2 normal.  Exam reveals no gallop and no friction rub.   No murmur heard. Pulmonary/Chest: Effort normal and breath sounds normal. No respiratory distress. She exhibits no tenderness.  Abdominal: Soft. Normal appearance and bowel sounds are normal. There is no hepatosplenomegaly. There is no tenderness. There is no rebound, no guarding, no  tenderness at McBurney's point and negative Murphy's sign. No hernia.  Musculoskeletal: Normal range of motion.  Neurological: She is alert and oriented to person, place, and time. She has normal strength. No cranial nerve deficit or sensory deficit. Coordination normal. GCS eye subscore is 4. GCS verbal subscore is 5. GCS motor subscore is 6.   normal strength and sensation in upper extremities  Extraocular muscle movement: normal No visual field cut Pupils: equal and reactive both direct and consensual response is normal No nystagmus present    Sensory function is intact to light touch, pinprick Proprioception intact  Grip strength 5/5 symmetric in upper extremities No pronator drift Normal finger to nose bilaterally  Lower extremity strength 5/5 against gravity   Gait: normal   Skin: Skin is warm, dry and intact. No rash noted. No cyanosis.  Psychiatric: She has a normal mood and affect. Her speech is normal and behavior is normal. Thought content normal.  Nursing note and vitals reviewed.  ED Course  Procedures  DIAGNOSTIC STUDIES:  Oxygen Saturation is 97% on RA, normal by my interpretation.    COORDINATION OF CARE:  11:28 PM Will administer medication and discharge with prescription. Discussed treatment plan with pt at bedside and pt agreed to plan.  Labs Review Labs Reviewed - No data to display  Imaging Review No results found. I have personally reviewed and evaluated these images and lab results as part of my medical decision-making.   EKG Interpretation None      MDM   Final diagnoses:  Cervical radiculopathy   Presents to the emergency department for evaluation of head and neck pain. Patient reports that she has had chronic neck pain for approximately 2 years, but recently symptoms have worsened. Pain is now shooting up to the back of her head and down her right shoulder and arm. She has not had any weakness or neurologic symptoms. Examination reveals  normal strength and sensation. As her neurologic examination is normal, she does not require any imaging today. Will treat empirically.  I personally performed the services described in this documentation, which was scribed in my presence. The recorded information has been reviewed and is accurate.    Gilda Creasehristopher J Marleen Moret, MD 11/16/15 2352

## 2015-11-16 NOTE — Discharge Instructions (Signed)
Radiculopatía cervical °(Cervical Radiculopathy) °La radiculopatía cervical se presenta cuando un nervio del cuello (nervio cervical) está comprimido o dañado. Esta afección puede producirse debido a una lesión o como parte del proceso de envejecimiento normal. La compresión de los nervios cervicales puede causar dolor o adormecimiento que se extiende desde el cuello hasta el brazo y los dedos de la mano. Generalmente, esta afección mejora con reposo. Si no mejora, tal vez sea necesario administrar un tratamiento.  °CAUSAS °Esta afección puede ser causada por lo siguiente: °· Lesiones. °· Desplazamiento de un disco (hernia). °· Rigidez en el cuello debido al uso excesivo. °· Artritis. °· Fractura o degeneración de los huesos y las articulaciones de la columna (espondiloartrosis) debido al envejecimiento. °· Espolones óseos que pueden formarse cerca de los nervios cervicales. °SÍNTOMAS °Los síntomas de esta afección incluyen lo siguiente: °· Dolor que se extiende desde el cuello hasta el brazo y la mano. El dolor puede ser intenso o molesto y empeorar al mover el cuello. °· Adormecimiento o debilidad del brazo y la mano afectados. °DIAGNÓSTICO °Esta afección se puede diagnosticar en función de los síntomas, la historia clínica y un examen físico. También pueden hacerle exámenes, que incluyen los siguientes: °· Radiografías. °· Tomografía computarizada. °· Resonancia magnética. °· Electromiograma (EMG). °· Pruebas de conducción nerviosa. °TRATAMIENTO °En muchos casos, no se requiere tratamiento para esta afección. Con reposo, esta suele mejorar con el tiempo. Si es necesario administrar tratamiento, las opciones pueden incluir lo siguiente: °· Usar un collarín blando durante períodos breves. °· Fisioterapia para fortalecer los músculos del cuello. °· Medicamentos, como los antiinflamatorios no esteroides (AINE), los corticoides por vía oral o las inyecciones en la columna vertebral. °· Cirugía. Esta puede ser  necesaria si otros tratamientos no son eficaces. Se pueden realizar varios tipos de cirugía en función de la causa de los problemas. °INSTRUCCIONES PARA EL CUIDADO EN EL HOGAR °Control del dolor °· Tome los medicamentos de venta libre y los recetados solamente como se lo haya indicado el médico. °· Si se lo indican, aplique hielo en la zona afectada. °¨ Ponga el hielo en una bolsa plástica. °¨ Coloque una toalla entre la piel y la bolsa de hielo. °¨ Coloque el hielo durante 20 minutos, 2 a 3 veces por día. °· Si el hielo no le alivia el dolor, intente aplicarse calor. Tome una ducha o un baño tibios, o colóquese una compresa de calor como se lo haya indicado el médico. °· Intente darse un masaje suave en el cuello y el hombro para ayudar a aliviar los síntomas. °Actividad °· Descanse todo lo que sea necesario. Siga las indicaciones del médico respecto de las restricciones en las actividades. °· Realice ejercicios de elongación y fortalecimiento como se lo hayan indicado el médico o el fisioterapeuta. °Instrucciones generales °· Si le indicaron que use un collarín, úselo como se lo haya indicado el médico. °· Use una almohada plana para dormir. °· Concurra a todas las visitas de control como se lo haya indicado el médico. Esto es importante. °SOLICITE ATENCIÓN MÉDICA SI: °· La afección no mejora con tratamiento. °SOLICITE ATENCIÓN MÉDICA DE INMEDIATO SI: °· El dolor se intensifica y no se alivia con los medicamentos. °· Siente debilidad o adormecimiento en la mano, el brazo, el rostro o la pierna. °· Tiene fiebre alta. °· Tiene rigidez de cuello. °· Pierde el control de la vejiga o los intestinos (tiene incontinencia). °· Tiene dificultad para caminar, mantener el equilibrio o hablar. °  °Esta información no tiene como fin   reemplazar el consejo del médico. Asegúrese de hacerle al médico cualquier pregunta que tenga. °  °Document Released: 04/15/2005 Document Revised: 03/27/2015 °Elsevier Interactive Patient Education  ©2016 Else[employer] Inc. ° °

## 2015-11-16 NOTE — ED Notes (Addendum)
Patient c/o neck pain, headache, and nausea. Patient states the pain in the back of her head has always been there and she takes 800mg  ibuprofen and it goes away, last dose was last night. Patient also c/o nausea. Patient does not speak english.

## 2015-11-17 NOTE — ED Notes (Signed)
Pt reports understanding of discharge information. No questions at time of discharge after conversing using translator.

## 2016-10-31 ENCOUNTER — Ambulatory Visit (INDEPENDENT_AMBULATORY_CARE_PROVIDER_SITE_OTHER): Payer: Self-pay | Admitting: Family Medicine

## 2016-10-31 VITALS — BP 129/86 | HR 77 | Temp 98.9°F | Resp 18 | Ht 62.0 in | Wt 173.4 lb

## 2016-10-31 DIAGNOSIS — K602 Anal fissure, unspecified: Secondary | ICD-10-CM

## 2016-10-31 DIAGNOSIS — K5909 Other constipation: Secondary | ICD-10-CM

## 2016-10-31 MED ORDER — PSYLLIUM 58.6 % PO POWD: 1.0000 | Freq: Three times a day (TID) | ORAL | 12 refills | Status: DC

## 2016-10-31 MED ORDER — SENNOSIDES-DOCUSATE SODIUM 8.6-50 MG PO TABS: 1.0000 | ORAL_TABLET | Freq: Every evening | ORAL | 0 refills | Status: DC | PRN

## 2016-10-31 NOTE — Addendum Note (Signed)
Addended by: Baldwin Crown D on: 10/31/2016 12:20 PM   Modules accepted: Orders

## 2016-10-31 NOTE — Patient Instructions (Addendum)
IF you received an x-ray today, you will receive an invoice from [facility]. Please contact [facility] at [phone] with questions or concerns regarding your invoice.   IF you received labwork today, you will receive an invoice from [facility]. Please contact [facility] at [phone] with questions or concerns regarding your invoice.   Our billing staff will not be able to assist you with questions regarding bills from these companies.  You will be contacted with the lab results as soon as they are available. The fastest way to get your results is to activate your My Chart account. Instructions are located on the last page of this paperwork. If you have not heard from [place] regarding the results in 2 weeks, please contact this office.      Fisura anal en los adultos (Anal Fissure, Adult) Una fisura anal es un pequeo desgarro o un corte en la piel que rodea el ano. El sangrado proveniente de una fisura suele detenerse solo despus de algunos minutos. Pero generalmente, se repetir cada vez que defeque hasta que el corte cicatrice. CAUSAS Esta afeccin puede ser causada por lo siguiente:  Defecacin de material fecal (heces) dura y voluminosa.  Diarrea frecuente.  Estreimiento.  Enfermedad intestinal inflamatoria (enfermedad de Crohn o colitis ulcerosa).  Infecciones.  Sexo anal. SNTOMAS Los sntomas de esta afeccin incluyen lo siguiente:  Sangrado que proviene del recto.  Pequeas cantidades de sangre que se observan en las heces, en el papel higinico o en el inodoro despus de defecar.  Dolor al defecar.  Picazn o irritacin alrededor del ano. DIAGNSTICO  El mdico puede diagnosticar esta afeccin con un examen exhaustivo de la zona anal. Baxter Flattery revisin cuidadosa permite observar la fisura anal. En algunos casos, se puede Artist, o bien utilizar un tubo corto (anoscopio) para examinar el conducto anal. TRATAMIENTO   El tratamiento de esta afeccin puede incluir lo siguiente:  Tomar medidas para evitar el estreimiento, que pueden incluir cambios en la dieta, por ejemplo, aumentar la ingesta de fibras o de lquidos.  Tomar suplementos de [place], los cuales pueden ablandar las heces y ayudar a Scientist, research (physical sciences). El mdico tambin puede recetarle un ablandador de heces si estas suelen ser duras.  Tomar baos de asiento, que pueden ayudar a que el Higher education careers adviser.  Usar cremas o ungentos medicinales, que se pueden recetar para [employer]. INSTRUCCIONES PARA EL CUIDADO EN EL HOGAR Comida y bebida  No consuma los alimentos que pueden causar estreimiento, como las bananas y los productos lcteos.  Beba suficiente lquido para Consulting civil engineer orina clara o de color amarillo plido.  Siga una dieta que incluya gran cantidad de frutas, cereales integrales y verduras. Instrucciones generales  Mantenga la zona anal tan limpia y seca como sea posible.  Tome baos de asiento como se lo haya indicado el mdico. No use jabn en los baos de [place].  Tome los medicamentos de venta libre y los recetados solamente como se lo haya indicado el mdico.  Aplquese cremas o ungentos como se lo haya indicado el mdico.  Concurra a todas las visitas de control como se lo haya indicado el mdico. Esto es importante. SOLICITE ATENCIN MDICA SI:  Aumenta el sangrado.  Tiene fiebre.  Tiene diarrea mezclada con [place].  Sigue teniendo [employer].  El problema empeora en vez de mejorar. Esta informacin no tiene Marine scientist el consejo del mdico. Asegrese de hacerle al mdico cualquier pregunta que tenga. Document Released:  07/06/2005 Document Revised: 03/27/2015 Document Reviewed: 10/01/2014 Elsevier Interactive Patient Education  2017 [place] rica en fibra (High-Fiber Diet) Joya Martyr, tambin llamada fibra dietaria, es un tipo de carbohidrato que se encuentra en las frutas,  las verduras, los cereales integrales y los frijoles. Una dieta rica en fibra puede tener muchos beneficios para la salud. El mdico puede recomendar una dieta rica en fibra para ayudar a:  Contractor. La fibra puede hacer que defeque con ms frecuencia.  Disminuir el nivel de colesterol.  [place] hemorroides, la diverticulosis no complicada o el sndrome del intestino irritable.  Evitar comer en exceso como parte de un plan para bajar de peso.  Evitar cardiopatas, la diabetes tipo 2 y ciertos cnceres. EN QU CONSISTE EL PLAN? El consumo diario recomendado de fibra incluye lo siguiente:  38gramos para hombres menores de 20 aos.  30gramos para hombres mayores de 50 aos.  25gramos para mujeres menores de 50 aos.  21gramos para mujeres mayores de 50 aos. Puede lograr el consumo diario recomendado de fibra si come una variedad de frutas, verduras, cereales y frijoles. El mdico tambin puede recomendar un suplemento de fibra si no es posible obtener suficiente fibra a travs de la dieta. QU DEBO SABER ACERCA DE [place]?  La eficacia de los suplementos de [place] no ha sido estudiada [place], de modo que es mejor obtener fibra a travs de los alimentos.  Verifique siempre el contenido de fibra en la etiqueta de informacin nutricional de los alimentos preenvasados. Busque alimentos que contengan al menos 5gramos de fibra por porcin.  Consulte al nutricionista si tiene preguntas sobre algunos alimentos especficos relacionados con su enfermedad, especialmente si estos alimentos no se mencionan a continuacin.  Aumente el consumo diario de fibra en forma gradual. Aumentar demasiado rpido el consumo de fibra dietaria puede provocar meteorismo, clicos o gases.  Beber abundante agua. El [place] a Economist. QU ALIMENTOS PUEDO COMER? Cereales Panes integrales. Multicereales. Avena. Arroz integral. Dwyane Luo. Trigo burgol. Mijo.  Muffins de salvado. Palomitas de maz. Galletas de centeno. Verduras Batatas. [place]. Col rizada. [place]as. Repollo. Brcoli. Guisantes. Zanahorias. [place]. Frutas Frutos rojos. Peras. Manzanas. Naranjas Agu[place]ates. Ciruelas y pasas. Higos secos. Carnes y otras fuentes de protenas Frijoles blancos, colorados, pintos y porotos de soja. Guisantes secos. Lentejas. Frutos secos y semillas. Lcteos Yogur fortificado con Pharmacist, hospital. Bebidas Leche de soja fortificada con Fredderick [place]. Jugo de naranja fortificado con Fredderick [place]ix. Otros Barras de [place]. Los artculos mencionados arriba pueden no ser [employer] de las bebidas o los alimentos recomendados. Comunquese con el nutricionista para conocer ms opciones. QU ALIMENTOS NO SE RECOMIENDAN? Cereales Pan blanco. Pastas hechas con Letitia Neri. Arroz blanco. Verduras Papas fritas. Verduras enlatadas. Verduras bien cocidas. Frutas Jugo de frutas. Frutas cocidas coladas. Carnes y otras fuentes de protenas Cortes de carne con Lobbyist. Aves o pescados fritos. Lcteos Leche. Yogur. Queso crema. R[employer] Aid. Bebidas Gaseosas. Otros Tortas y pasteles. [place] y aceites. Los artculos mencionados arriba pueden no ser [employer] de las bebidas y los alimentos que se Higher education careers adviser. Comunquese con el nutricionista para obtener ms informacin. ALGUNOS CONSEJOS PARA INCLUIR ALIMENTOS RICOS EN FIBRA EN LA DIETA  Consuma una gran variedad de alimentos ricos en fibra.  Asegrese de que la mitad de todos los cereales consumidos cada da sean cereales integrales.  Reemplace los panes y cereales hechos de harina refinada o harina blanca por panes y cereales integrales.  Reemplace el Lorre Nick  blanco por arroz integral, trigo burgol o mijo.  Comience Games developer con un desayuno rico en [place], como un cereal que contenga al menos 5gramos de fibra por porcin.  Use guisantes en lugar de carne en las sopas, ensaladas o pastas.  Coma bocadillos  ricos en fibra, como frutos rojos, verduras crudas, frutos secos o palomitas de maz. Esta informacin no tiene Marine scientist el consejo del mdico. Asegrese de hacerle al mdico cualquier pregunta que tenga. Document Released: 07/06/2005 Document Revised: 10/28/2015 Document Reviewed: 12/19/2013 Elsevier Interactive Patient Education  2017 [place] de un desgarro perineal (Care of a Perineal Tear) Un desgarro perineal es un corte (laceracin) del tejido que se encuentra entre la abertura de la vagina y el ano (perineo). En algunas mujeres, el desgarro perineal se produce naturalmente durante un parto vaginal. Esto puede ocurrir cuando el beb sale por el canal de [place], y el perineo se estira. Los desgarros perineales se clasifican en funcin de su profundidad y de la longitud de Passenger transport manager. La clasificacin de los desgarros perineales es la siguiente:  Neurosurgeon. Un desgarro superficial en el borde de la abertura de la vagina que se extiende levemente a la piel del perineo.  [employer]rex Corporation. Un desgarro perineal descrito como de primer grado y, adems, uno ms profundo de la abertura vaginal y los tejidos del perineo. Tambin puede Public relations account executive de un msculo justo por debajo de la piel del perineo.  Tercer Alan Ripper. Un desgarro perineal descrito como de primero y Pepsi[employer] que se extiende hasta el msculo del ano (esfnter anal).  [employer]. [employer] de desgarro perineal descritos como de primero, segundo y tercer grado que se extiende Social worker. Los desgarros perineales de primer grado pueden suturarse o no, dependiendo de su ubicacin y aspecto. Los de [place], tercero y cuarto grado se suturan inmediatamente despus del parto. RIESGOS Y COMPLICACIONES En funcin del tipo de desgarro perineal que tenga, puede correr riesgo de tener lo siguiente:  Hemorragia.  Una acumulacin de sangre en la zona del desgarro perineal (hematoma).  Dolor.  Puede sentir dolor al [place] o al defecar.  Infeccin en el lugar del desgarro.  Cristy Hilts.  Dificultad para controlar los intestinos (incontinencia fecal).  Relaciones sexuales dolorosas. CMO CUIDAR UN [facility] PERINEAL  Engineer, manufacturing systems, ponga hielo en la zona del desgarro.  Ponga el hielo en una bolsa plstica.  Coloque una toalla entre la piel y la bolsa de hielo.  Coloque el hielo durante 20 minutos, 2 a 3 veces por da.  Tome baos de asiento con agua tibia segn las indicaciones del mdico. Esto puede acelerar la curacin. Puede tomar los baos de asiento en la baera o usar un kit para baos de asiento que se coloca sobre el inodoro.  Llene la baera con 3 o 4pulgadas (7,6 a 10cm) de agua tibia o llene el recipiente para baos de asiento sobre el inodoro con agua tibia. Para comprobar que el agua no est muy caliente, pngase una gota en la [place].  Sintese en el agua tibia durante 20 a 70mnutos.  Despus del bao, use una toalla limpia para secar el perineo con golpecitos suaves. No se frote el perineo ya que esto podra provocarle dolor, irritacin o abrir las suturas.  Para mantener el recipiente para los baos de asiento limpio enjuguelo bien despus de cada uso. Para mantener limpia la baera pida ayuda y lvela con lavandina diluida y agua (2cucharadas [346m de lavandina  cada medio galn [1,9l] de agua).  Repita el bao de asiento con la frecuencia que desee para Best boy, la [place] molestias perineales.  Aplique un anestsico en aerosol en TEFL teacher del desgarro perineal de acuerdo con las indicaciones del mdico. Esto ayuda a Actor las [place].  Lvese las manos antes y despus de aplicar medicamentos en la zona.  Sobre el apsito sanitario, coloque unas 3 compresas para tratamiento de las hemorroides que contengan agua de hamamelis. El agua de hamamelis ayuda a Runner, broadcasting/film/video y la hinchazn.  Consiga una botella de plstico flexible  para enjuagar el perineo con agua tibia cuando orine y roce la zona de Systems developer atrs. Seque la zona dando golpecitos.  Sentarse sobre un aro o una almohada inflable puede ser cmodo.  Tome los medicamentos solamente como se lo haya indicado el mdico.  No tenga relaciones sexuales ni use tampones hasta que el mdico lo autorice. Generalmente, deber esperar al menos 6semanas.  Concurra a todos los controles del posparto como se lo haya indicado el mdico. SOLICITE ATENCIN MDICA SI:  El dolor no se alivia con los medicamentos.  Siente dolor al [employer].  Tiene fiebre. [place] DE INMEDIATO SI:  Tiene enrojecimiento o hinchazn, o aumenta el dolor en la zona del Tree surgeon.  Tiene secrecin de pus de la zona del Tree surgeon.  Advierte un olor ftido que proviene de la zona del Tree surgeon.  El desgarro se abre.  Advierte mayor hinchazn en la zona del desgarro en comparacin a cuando sali del hospital.  No puede orinar. Esta informacin no tiene Marine scientist el consejo del mdico. Asegrese de hacerle al mdico cualquier pregunta que tenga. Document Released: 11/20/2013 Document Revised: 07/27/2014 Document Reviewed: 04/11/2013 Elsevier Interactive Patient Education  2017 [employer].

## 2016-10-31 NOTE — Addendum Note (Signed)
Addended by: Baldwin Crown D on: 10/31/2016 01:00 PM   Modules accepted: Orders

## 2016-10-31 NOTE — Progress Notes (Signed)
Chief Complaint  Patient presents with  . Constipation    Pain with bowel movement    HPI  Translate: Sharon Johnson (daughter) Patient has a bowel movement yesterday She reports that her BM was normal She has been having pain with her BM She reports that she has pain with straining and pain with passing the stool She reports that her stool caliber and color are normal but mostly thin She sees blood in the toilet in the first few weeks but now she only sees it on the toilet paper She feels itchiness as well  In a week she has about 2-3  She drinks plenty of water She does not take a fiber supplement  She does not use laxatives  She reports that when she has a BM she feels pressure in the groin She also gets goose bump in her feet   Past Medical History:  Diagnosis Date  . Hypertension     Current Outpatient Prescriptions  Medication Sig Dispense Refill  . ibuprofen (ADVIL,MOTRIN) 800 MG tablet Take 800 mg by mouth every 8 (eight) hours as needed (for pain.).    Marland Kitchen levonorgestrel (MIRENA) 20 MCG/24HR IUD 1 each by Intrauterine route once.    Marland Kitchen lisinopril-hydrochlorothiazide (PRINZIDE,ZESTORETIC) 10-12.5 MG tablet Take 1 tablet by mouth daily.    . traZODone (DESYREL) 50 MG tablet Take 50 mg by mouth 3 (three) times daily.    Marland Kitchen amoxicillin (AMOXIL) 875 MG tablet Take 1 tablet (875 mg total) by mouth 2 (two) times daily. (Patient not taking: Reported on 11/16/2015) 20 tablet 0  . cyclobenzaprine (FLEXERIL) 10 MG tablet Take 1 tablet (10 mg total) by mouth 3 (three) times daily as needed for muscle spasms. (Patient not taking: Reported on 10/31/2016) 20 tablet 0  . HYDROcodone-acetaminophen (NORCO/VICODIN) 5-325 MG tablet Take 1 tablet by mouth every 4 (four) hours as needed for moderate pain. (Patient not taking: Reported on 10/31/2016) 10 tablet 0  . predniSONE (DELTASONE) 20 MG tablet 3 tabs po daily x 3 days, then 2 tabs x 3 days, then 1.5 tabs x 3 days, then 1 tab x 3 days, then 0.5  tabs x 3 days (Patient not taking: Reported on 10/31/2016) 27 tablet 0  . psyllium (METAMUCIL SMOOTH TEXTURE) 58.6 % powder Take 1 packet by mouth 3 (three) times daily. 283 g 12  . senna-docusate (SENOKOT-S) 8.6-50 MG tablet Take 1 tablet by mouth at bedtime as needed for moderate constipation. For one week then as needed. This is not for regular daily use 30 tablet 0   No current facility-administered medications for this visit.     Allergies: No Known Allergies  No past surgical history on file.  Social History   Social History  . Marital status: Married    Spouse name: N/A  . Number of children: N/A  . Years of education: N/A   Social History Main Topics  . Smoking status: Never Smoker  . Smokeless tobacco: Never Used  . Alcohol use No  . Drug use: No  . Sexual activity: Not Asked   Other Topics Concern  . None   Social History Narrative  . None    Review of Systems  Constitutional: Negative for chills and fever.  Gastrointestinal: Positive for abdominal pain, blood in stool and constipation. Negative for melena, nausea and vomiting.  Skin: Negative for itching and rash.  Neurological: Negative for dizziness, tingling and headaches.   See hpi  Objective: Vitals:   10/31/16 1038  BP: 129/86  Pulse: 77  Resp: 18  Temp: 98.9 F (37.2 C)  TempSrc: Oral  SpO2: 97%  Weight: 173 lb 6 oz (78.6 kg)  Height:  (1.575 m)    Physical Exam  Constitutional: She is oriented to person, place, and time. She appears well-developed and well-nourished.  HENT:  Head: Normocephalic and atraumatic.  Eyes: Conjunctivae and EOM are normal.  Pulmonary/Chest: Effort normal.  Neurological: She is alert and oriented to person, place, and time.     Rectal Exam Normal sphincter tone No visible fissure No bloody discharge External hemorrhoid  Stool in the vault   Assessment and Plan Sharon Johnson was seen today for constipation.  Diagnoses and all orders for this  visit:  Chronic constipation -     IFOBT POC (occult bld, rslt in office); Future -     senna-docusate (SENOKOT-S) 8.6-50 MG tablet; Take 1 tablet by mouth at bedtime as needed for moderate constipation. For one week then as needed. This is not for regular daily use -     psyllium (METAMUCIL SMOOTH TEXTURE) 58.6 % powder; Take 1 packet by mouth 3 (three) times daily.  Fissure, anal Discussed that she likely has an anal fissure Recommended sitz baths Discussed ways to prevent recurrence     Sharon Johnson A Creta Levin

## 2016-12-25 ENCOUNTER — Ambulatory Visit (INDEPENDENT_AMBULATORY_CARE_PROVIDER_SITE_OTHER): Payer: Self-pay | Admitting: Physician Assistant

## 2016-12-25 ENCOUNTER — Encounter: Payer: Self-pay | Admitting: Physician Assistant

## 2016-12-25 VITALS — BP 115/76 | HR 88 | Temp 99.2°F | Resp 17 | Ht 62.5 in | Wt 178.0 lb

## 2016-12-25 DIAGNOSIS — Z1231 Encounter for screening mammogram for malignant neoplasm of breast: Secondary | ICD-10-CM

## 2016-12-25 DIAGNOSIS — N644 Mastodynia: Secondary | ICD-10-CM

## 2016-12-25 DIAGNOSIS — N63 Unspecified lump in unspecified breast: Secondary | ICD-10-CM

## 2016-12-25 DIAGNOSIS — S46811A Strain of other muscles, fascia and tendons at shoulder and upper arm level, right arm, initial encounter: Secondary | ICD-10-CM

## 2016-12-25 DIAGNOSIS — M62838 Other muscle spasm: Secondary | ICD-10-CM

## 2016-12-25 MED ORDER — IBUPROFEN 800 MG PO TABS: 800.0000 mg | ORAL_TABLET | Freq: Three times a day (TID) | ORAL | 0 refills | Status: DC | PRN

## 2016-12-25 MED ORDER — CYCLOBENZAPRINE HCL 5 MG PO TABS: 5.0000 mg | ORAL_TABLET | Freq: Three times a day (TID) | ORAL | 0 refills | Status: DC | PRN

## 2016-12-25 NOTE — Progress Notes (Signed)
Sharon MomentHilda V Johnson  MRN: [id number] DOB: Jun 04, 1976  PCP: Patient, No Pcp Per  Chief Complaint  Patient presents with  . Arm Pain    right     Subjective:  Pt presents to clinic for right arm pain and breast pain and she is out of her medications because the clinic that she was going to closed.   Right arm pain for the last 2 weeks.  Pain starts in the neck and shoulder area and then when she moves the arm the hand feels numb.  She feels some weakness but thinks that it is from the pain - the pain is in her neck and muscles but not in her shoulder joint.  She has no known injury to the area. She has been on motrin 800mg  and flexeril from another clinic that she would like a refill of.  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  Right breast pain for a week - daughter is more concerned about this - she has never had a mammogram and the patient has 2 older sisters both with breast cancer in [place] (there ages of diagnosis is not known) LMP - 5/20 - she does get breast pain with her menses - this feels similar but more intense - movement of the arm makes the pain worse palpation of the breast causes more pain - no discharge from nipple - no change in how the breast feels or looks - mirena placed 3 years ago - 2 older sisters both with breast cancer   Review of Systems  Musculoskeletal: Negative for neck pain.    There are no active problems to display for this patient.   Current Outpatient Prescriptions on File Prior to Visit  Medication Sig Dispense Refill  . levonorgestrel (MIRENA) 20 MCG/24HR IUD 1 each by Intrauterine route once.     No current facility-administered medications on file prior to visit.     No Known Allergies  Pt patients past, family and social history were reviewed and updated.   Objective:  BP 115/76   Pulse 88   Temp 99.2 F (37.3 C) (Oral)   Resp 17   Ht 5' 2.5" (1.588 m)   Wt 178 lb (80.7 kg)   SpO2 98%   BMI  32.04 kg/m   Physical Exam  Constitutional: She is oriented to person, place, and time and well-developed, well-nourished, and in no distress.  HENT:  Head: Normocephalic and atraumatic.  Right Ear: Hearing and external ear normal.  Left Ear: Hearing and external ear normal.  Eyes: Conjunctivae are normal.  Neck: Normal range of motion.  Pulmonary/Chest: Effort normal. Right breast exhibits no inverted nipple, no mass, no nipple discharge, no skin change and no tenderness. Left breast exhibits tenderness (fibrous breast with tenderness over most of bresat tissue worse at the 3 and 6 oclock position with palpable denser tissue in that area). Left breast exhibits no inverted nipple, no mass, no nipple discharge and no skin change.  Musculoskeletal:       Cervical back: She exhibits spasm (right trap). She exhibits normal range of motion and no tenderness.       Back:  Lymphadenopathy:    She has no axillary adenopathy.  Neurological: She is alert and oriented to person, place, and time. Gait normal.  Skin: Skin is warm and dry.  Psychiatric: Mood, memory, affect and judgment normal.  Vitals reviewed.   Assessment and Plan :  Muscle spasm -  Plan: cyclobenzaprine (FLEXERIL) 5 MG tablet, Care order/instruction:  Strain of right trapezius muscle, initial encounter - Plan: ibuprofen (ADVIL,MOTRIN) 800 MG tablet, cyclobenzaprine (FLEXERIL) 5 MG tablet - refilled medications - changed flexeril to more frequent dosing at lower dose for tolerability - heat - likely 2nd to her dominant handedness and job duties - PT might be very helpful to patient  Breast tenderness in female - likely hormonal but with history of breast cancer and palpable fibrous tissue mammogram ordered - screening on right breat and diagnostic on the left side  Breast mass - Plan: MM Digital Diagnostic Unilat L, US BREAST LTD UNI LEFT INC AXILLA  Visit for screening mammogram - Plan: MM Digital Screening Unilat R  Benny Lennert PA-C  [facility] at [facility] 12/28/2016 8:37 AM

## 2016-12-25 NOTE — Patient Instructions (Addendum)
Heat to the muscles that are sore - medications as directed   You will get a call about the mammogram    IF you received an x-ray today, you will receive an invoice [facility]. Please contact [facility] at [phone] with questions or concerns regarding your invoice.   IF you received labwork today, you will receive an invoice from [facility]. Please contact [facility] at [phone] with questions or concerns regarding your invoice.   Our billing staff will not be able to assist you with questions regarding bills from these companies.  You will be contacted with the lab results as soon as they are available. The fastest way to get your results is to activate your My Chart account. Instructions are located on the last page of this paperwork. If you have not heard from us regarding the results in 2 weeks, please contact this office.

## 2017-10-27 ENCOUNTER — Other Ambulatory Visit: Payer: Self-pay

## 2017-10-27 ENCOUNTER — Observation Stay (HOSPITAL_COMMUNITY)
Admission: EM | Admit: 2017-10-27 | Discharge: 2017-10-28 | Disposition: A | Discharge status: Home / Self Care | Payer: Medicaid Other | Attending: Family Medicine | Admitting: Family Medicine

## 2017-10-27 ENCOUNTER — Encounter (HOSPITAL_COMMUNITY): Payer: Self-pay

## 2017-10-27 DIAGNOSIS — R740 Nonspecific elevation of levels of transaminase and lactic acid dehydrogenase [LDH]: Secondary | ICD-10-CM | POA: Insufficient documentation

## 2017-10-27 DIAGNOSIS — R17 Unspecified jaundice: Secondary | ICD-10-CM

## 2017-10-27 DIAGNOSIS — R112 Nausea with vomiting, unspecified: Secondary | ICD-10-CM | POA: Diagnosis not present

## 2017-10-27 DIAGNOSIS — D72829 Elevated white blood cell count, unspecified: Secondary | ICD-10-CM | POA: Diagnosis not present

## 2017-10-27 DIAGNOSIS — I10 Essential (primary) hypertension: Secondary | ICD-10-CM | POA: Insufficient documentation

## 2017-10-27 DIAGNOSIS — R1030 Lower abdominal pain, unspecified: Secondary | ICD-10-CM | POA: Diagnosis not present

## 2017-10-27 DIAGNOSIS — K8020 Calculus of gallbladder without cholecystitis without obstruction: Secondary | ICD-10-CM

## 2017-10-27 DIAGNOSIS — R109 Unspecified abdominal pain: Secondary | ICD-10-CM | POA: Diagnosis present

## 2017-10-27 DIAGNOSIS — R7401 Elevation of levels of liver transaminase levels: Secondary | ICD-10-CM | POA: Diagnosis present

## 2017-10-27 HISTORY — DX: Migraine, unspecified, not intractable, without status migrainosus: G43.909

## 2017-10-27 LAB — CBC
HCT: 37.5 % (ref 36.0–46.0)
Hemoglobin: 12.6 g/dL (ref 12.0–15.0)
MCH: 29.9 pg (ref 26.0–34.0)
MCHC: 33.6 g/dL (ref 30.0–36.0)
MCV: 89.1 fL (ref 78.0–100.0)
PLATELETS: 320 10*3/uL (ref 150–400)
RBC: 4.21 MIL/uL (ref 3.87–5.11)
RDW: 13.9 % (ref 11.5–15.5)
WBC: 12.9 10*3/uL — ABNORMAL HIGH (ref 4.0–10.5)

## 2017-10-27 LAB — BASIC METABOLIC PANEL
ANION GAP: 10 (ref 5–15)
BUN: 13 mg/dL (ref 6–20)
CALCIUM: 9.4 mg/dL (ref 8.9–10.3)
CO2: 25 mmol/L (ref 22–32)
CREATININE: 0.8 mg/dL (ref 0.44–1.00)
Chloride: 102 mmol/L (ref 101–111)
GFR calc Af Amer: >60 mL/min (ref >60)
Glucose, Bld: 128 mg/dL — ABNORMAL HIGH (ref 65–99)
Potassium: 3.7 mmol/L (ref 3.5–5.1)
SODIUM: 137 mmol/L (ref 135–145)

## 2017-10-27 LAB — URINALYSIS, ROUTINE W REFLEX MICROSCOPIC
BILIRUBIN URINE: NEGATIVE
GLUCOSE, UA: NEGATIVE mg/dL
Ketones, ur: NEGATIVE mg/dL
Leukocytes, UA: NEGATIVE
NITRITE: NEGATIVE
Protein, ur: NEGATIVE mg/dL
SPECIFIC GRAVITY, URINE: 1.005 (ref 1.005–1.030)
pH: 6 (ref 5.0–8.0)

## 2017-10-27 LAB — LIPASE, BLOOD: Lipase: 82 U/L — ABNORMAL HIGH (ref 11–51)

## 2017-10-27 LAB — I-STAT BETA HCG BLOOD, ED (MC, WL, AP ONLY)

## 2017-10-27 NOTE — ED Notes (Signed)
Orders and triage entered in error

## 2017-10-27 NOTE — ED Triage Notes (Signed)
Pt reports that [facility] infusion for pain and since has had vomiting and abd pain, dizziness

## 2017-10-28 ENCOUNTER — Emergency Department (HOSPITAL_COMMUNITY): Payer: Medicaid Other

## 2017-10-28 ENCOUNTER — Encounter (HOSPITAL_COMMUNITY): Payer: Self-pay | Admitting: Internal Medicine

## 2017-10-28 ENCOUNTER — Other Ambulatory Visit: Payer: Self-pay

## 2017-10-28 DIAGNOSIS — K8020 Calculus of gallbladder without cholecystitis without obstruction: Secondary | ICD-10-CM

## 2017-10-28 DIAGNOSIS — R1030 Lower abdominal pain, unspecified: Secondary | ICD-10-CM | POA: Diagnosis not present

## 2017-10-28 DIAGNOSIS — R17 Unspecified jaundice: Secondary | ICD-10-CM | POA: Diagnosis not present

## 2017-10-28 DIAGNOSIS — D72829 Elevated white blood cell count, unspecified: Secondary | ICD-10-CM | POA: Diagnosis not present

## 2017-10-28 DIAGNOSIS — R740 Nonspecific elevation of levels of transaminase and lactic acid dehydrogenase [LDH]: Secondary | ICD-10-CM | POA: Diagnosis not present

## 2017-10-28 DIAGNOSIS — R112 Nausea with vomiting, unspecified: Secondary | ICD-10-CM | POA: Diagnosis not present

## 2017-10-28 DIAGNOSIS — R7401 Elevation of levels of liver transaminase levels: Secondary | ICD-10-CM | POA: Diagnosis present

## 2017-10-28 LAB — CBC WITH DIFFERENTIAL/PLATELET
BASOS ABS: 0 10*3/uL (ref 0.0–0.1)
BASOS PCT: 0 %
Eosinophils Absolute: 0.1 10*3/uL (ref 0.0–0.7)
Eosinophils Relative: 1 %
HEMATOCRIT: 37.3 % (ref 36.0–46.0)
Hemoglobin: 12.4 g/dL (ref 12.0–15.0)
LYMPHS PCT: 27 %
Lymphs Abs: 2.3 10*3/uL (ref 0.7–4.0)
MCH: 29.6 pg (ref 26.0–34.0)
MCHC: 33.2 g/dL (ref 30.0–36.0)
MCV: 89 fL (ref 78.0–100.0)
MONO ABS: 0.7 10*3/uL (ref 0.1–1.0)
Monocytes Relative: 8 %
NEUTROS ABS: 5.5 10*3/uL (ref 1.7–7.7)
Neutrophils Relative %: 64 %
PLATELETS: 296 10*3/uL (ref 150–400)
RBC: 4.19 MIL/uL (ref 3.87–5.11)
RDW: 14 % (ref 11.5–15.5)
WBC: 8.6 10*3/uL (ref 4.0–10.5)

## 2017-10-28 LAB — COMPREHENSIVE METABOLIC PANEL
ALT: 110 U/L — AB (ref 14–54)
AST: 70 U/L — AB (ref 15–41)
Albumin: 3.6 g/dL (ref 3.5–5.0)
Alkaline Phosphatase: 59 U/L (ref 38–126)
Anion gap: 9 (ref 5–15)
BILIRUBIN TOTAL: 0.9 mg/dL (ref 0.3–1.2)
BUN: 7 mg/dL (ref 6–20)
CO2: 23 mmol/L (ref 22–32)
CREATININE: 0.5 mg/dL (ref 0.44–1.00)
Calcium: 8.6 mg/dL — ABNORMAL LOW (ref 8.9–10.3)
Chloride: 106 mmol/L (ref 101–111)
Glucose, Bld: 119 mg/dL — ABNORMAL HIGH (ref 65–99)
Potassium: 3.9 mmol/L (ref 3.5–5.1)
Sodium: 138 mmol/L (ref 135–145)
TOTAL PROTEIN: 6.1 g/dL — AB (ref 6.5–8.1)

## 2017-10-28 LAB — RAPID URINE DRUG SCREEN, HOSP PERFORMED
Amphetamines: NOT DETECTED
BARBITURATES: NOT DETECTED
BENZODIAZEPINES: NOT DETECTED
COCAINE: NOT DETECTED
Opiates: POSITIVE — AB
Tetrahydrocannabinol: NOT DETECTED

## 2017-10-28 LAB — CK: Total CK: 172 U/L (ref 38–234)

## 2017-10-28 LAB — URINALYSIS, ROUTINE W REFLEX MICROSCOPIC
Bilirubin Urine: NEGATIVE
GLUCOSE, UA: NEGATIVE mg/dL
Hgb urine dipstick: NEGATIVE
Ketones, ur: NEGATIVE mg/dL
LEUKOCYTES UA: NEGATIVE
NITRITE: NEGATIVE
PH: 7 (ref 5.0–8.0)
Protein, ur: NEGATIVE mg/dL
SPECIFIC GRAVITY, URINE: 1.017 (ref 1.005–1.030)

## 2017-10-28 LAB — HEPATIC FUNCTION PANEL
ALT: 81 U/L — AB (ref 14–54)
AST: 137 U/L — ABNORMAL HIGH (ref 15–41)
Albumin: 4.3 g/dL (ref 3.5–5.0)
Alkaline Phosphatase: 57 U/L (ref 38–126)
BILIRUBIN DIRECT: 1.3 mg/dL — AB (ref 0.1–0.5)
BILIRUBIN INDIRECT: 2.7 mg/dL — AB (ref 0.3–0.9)
BILIRUBIN TOTAL: 4 mg/dL — AB (ref 0.3–1.2)
Total Protein: 7.1 g/dL (ref 6.5–8.1)

## 2017-10-28 LAB — LIPASE, BLOOD: LIPASE: 38 U/L (ref 11–51)

## 2017-10-28 LAB — HIV ANTIBODY (ROUTINE TESTING W REFLEX): HIV SCREEN 4TH GENERATION: NONREACTIVE

## 2017-10-28 LAB — TROPONIN I

## 2017-10-28 MED ORDER — FENTANYL CITRATE (PF) 100 MCG/2ML IJ SOLN: 25.0000 ug | INTRAMUSCULAR | Status: DC | PRN

## 2017-10-28 MED ORDER — ONDANSETRON HCL 4 MG PO TABS: 4.0000 mg | ORAL_TABLET | Freq: Four times a day (QID) | ORAL | Status: DC | PRN

## 2017-10-28 MED ORDER — IOPAMIDOL (ISOVUE-300) INJECTION 61%
100.0000 mL | Freq: Once | INTRAVENOUS | Status: AC | PRN
  Administered 2017-10-28: 100 mL via INTRAVENOUS

## 2017-10-28 MED ORDER — ACETAMINOPHEN 325 MG PO TABS
650.0000 mg | ORAL_TABLET | Freq: Four times a day (QID) | ORAL | Status: DC | PRN
  Administered 2017-10-28: 650 mg via ORAL
  Filled 2017-10-28: qty 2

## 2017-10-28 MED ORDER — ONDANSETRON HCL 4 MG/2ML IJ SOLN
4.0000 mg | Freq: Once | INTRAMUSCULAR | Status: AC
  Administered 2017-10-28: 4 mg via INTRAVENOUS
  Filled 2017-10-28: qty 2

## 2017-10-28 MED ORDER — SODIUM CHLORIDE 0.9 % IV BOLUS
1000.0000 mL | Freq: Once | INTRAVENOUS | Status: AC
  Administered 2017-10-28: 1000 mL via INTRAVENOUS

## 2017-10-28 MED ORDER — ONDANSETRON HCL 4 MG/2ML IJ SOLN: 4.0000 mg | Freq: Four times a day (QID) | INTRAMUSCULAR | Status: DC | PRN

## 2017-10-28 MED ORDER — ENOXAPARIN SODIUM 40 MG/0.4ML ~~LOC~~ SOLN: 40.0000 mg | SUBCUTANEOUS | Status: DC

## 2017-10-28 MED ORDER — IOPAMIDOL (ISOVUE-300) INJECTION 61%
INTRAVENOUS | Status: AC
  Filled 2017-10-28: qty 100

## 2017-10-28 MED ORDER — SODIUM CHLORIDE 0.9 % IV SOLN
INTRAVENOUS | Status: DC
  Administered 2017-10-28: 06:00:00 via INTRAVENOUS

## 2017-10-28 MED ORDER — FENTANYL CITRATE (PF) 100 MCG/2ML IJ SOLN
50.0000 ug | Freq: Once | INTRAMUSCULAR | Status: AC
  Administered 2017-10-28: 50 ug via INTRAVENOUS
  Filled 2017-10-28: qty 2

## 2017-10-28 MED ORDER — CYCLOBENZAPRINE HCL 5 MG PO TABS: 5.0000 mg | ORAL_TABLET | Freq: Three times a day (TID) | ORAL | Status: DC | PRN

## 2017-10-28 MED ORDER — MORPHINE SULFATE (PF) 4 MG/ML IV SOLN
4.0000 mg | Freq: Once | INTRAVENOUS | Status: AC
  Administered 2017-10-28: 4 mg via INTRAVENOUS
  Filled 2017-10-28: qty 1

## 2017-10-28 NOTE — ED Notes (Signed)
Attempted to draw pt's Lipase and CMP. Only able to get a small amount of blood. Informed Cindy - RN.

## 2017-10-28 NOTE — ED Notes (Signed)
MD at bedside. 

## 2017-10-28 NOTE — ED Notes (Signed)
Pt's lunch tray arrived. 

## 2017-10-28 NOTE — Discharge Summary (Signed)
Physician Discharge Summary  Sharon Johnson ZOX:[id number] DOB: 1976/03/27 DOA: 10/27/2017  PCP: Patient, No Pcp Per  Admit date: 10/27/2017 Discharge date: 10/28/2017  Time spent: 35 minutes minutes  Recommendations for Outpatient Follow-up:  1. Follow-up with outpatient chiropractor 2. Needs outpatient follow-up for mild suspected hepatic steatosis-would also recommend at that time viral hepatitis serologies although it is unlikely 3. Needs outpatient gynecology follow-up-Mirena has been placed 5 years ago and shelf life of the same is usually 3-5 years  Discharge Diagnoses:  Principal Problem:   Lower abdominal pain Active Problems:   Nausea and vomiting   Transaminitis   Hyperbilirubinemia   Leukocytosis   Discharge Condition: Improved  Diet recommendation: Regular  Filed Weights   10/27/17 2359  Weight: 77.6 kg (171 lb)    Hospital Course:  42 year old Hispanic female, prior use of IUD, HTN, migraine to visit went [facility] for vitamin infusion-at 6 PM on 4/10 developed sharp abdominal pain 8-9/10 lower abdomen + radiation, transient chest pain, I spoke to the prescribing chiropractor who informed me that patient was given an infusion of magnesium, vitamin C, vitamin B12 and B6 and was also given a fish oil supplement He indicated to me that the patient had been having stomach upset even prior to this and that they were family members who had been sick in the family. Lipase was done and was noted to be elevated at 80 but was repeated and was 38 patient was tolerating a full diet without any abdominal distress or nausea vomiting and pain and was discharged without further event, CT scan also did not confirm any findings at all of pancreatitis    Discharge Exam: Vitals:   10/28/17 0800 10/28/17 1015  BP: 121/71 123/74  Pulse: 79 82  Resp: 17 18  Temp:    SpO2: 97% 97%    General: Awake alert pleasant oriented no distress some  headache Cardiovascular: S1-S2 no murmur rub or gallop Respiratory: Clear clear no added sound Abdomen soft nontender no rebound however lower quadrant seems a little uncomfortable Neurologically is intact and conversant  Discharge Instructions   Discharge Instructions    Diet - low sodium heart healthy   Complete by:  As directed    Discharge instructions   Complete by:  As directed    Do not take any over-the-counter supplements or other prescriptions unless you discuss this with your medical doctor-do not in addition take any other supplements by other providers as we are not sure what they contain and you may have an adverse reaction to the same at this time Would recommend outpatient follow-up with your primary medical doctor and you may need to discuss with them getting some liver blood work   Increase activity slowly   Complete by:  As directed      Allergies as of 10/28/2017   No Known Allergies     Medication List    STOP taking these medications   cyclobenzaprine 5 MG tablet Commonly known as:  FLEXERIL   multivitamin with minerals Tabs tablet     TAKE these medications   ibuprofen 800 MG tablet Commonly known as:  ADVIL,MOTRIN Take 1 tablet (800 mg total) by mouth every 8 (eight) hours as needed (for pain.).   levonorgestrel 20 MCG/24HR IUD Commonly known as:  MIRENA 1 each by Intrauterine route once.      No Known Allergies    The results of significant diagnostics from this hospitalization (including imaging, microbiology, ancillary and  laboratory) are listed below for reference.    Significant Diagnostic Studies: Ct Abdomen Pelvis W Contrast  Result Date: 10/28/2017 CLINICAL DATA:  Acute abdominal pain. EXAM: CT ABDOMEN AND PELVIS WITH CONTRAST TECHNIQUE: Multidetector CT imaging of the abdomen and pelvis was performed using the standard protocol following bolus administration of intravenous contrast. CONTRAST:  100 cc Isovue-300 IV COMPARISON:  None.  FINDINGS: Lower chest: The lung bases are clear. Hepatobiliary: Decreased hepatic density consistent with steatosis. No focal lesion. Mild gallbladder distention without calcified stone or pericholecystic inflammation. Common bile duct is dilated measuring 10 mm. No visualized choledocholithiasis. Pancreas: No ductal dilatation or inflammation. Spleen: Normal in size without focal abnormality. Adrenals/Urinary Tract: Normal adrenal glands. Bilateral hydroureteronephrosis. Heterogeneous enhancement of both kidneys. There are nonobstructing stones within both kidneys. No ureteral calculi. No perirenal or intrarenal fluid collection. Urinary bladder is distended. No bladder wall thickening. No bladder stone. Stomach/Bowel: Stomach is within normal limits. Appendix appears normal. No evidence of bowel wall thickening, distention, or inflammatory changes. Vascular/Lymphatic: No significant vascular findings are present. No enlarged abdominal or pelvic lymph nodes. Reproductive: IUD appropriately positioned in the uterus. No adnexal mass. Other: No free air, free fluid, or intra-abdominal fluid collection. Musculoskeletal: There are no acute or suspicious osseous abnormalities. IMPRESSION: 1. Gallbladder and biliary dilatation without calcified gallstone or choledocholithiasis. Consider right upper quadrant ultrasound and possible MRCP for further evaluation. 2. Bilateral hydroureteronephrosis. Urinary bladder distention. Nonobstructing stones in both kidneys without ureteral stone. Recommend correlation for urinary retention or urinary tract infection. Electronically Signed   By: Rubye OaksMelanie  Ehinger M.D.   On: 10/28/2017 02:36   [place] Abdomen Limited  Result Date: 10/28/2017 CLINICAL DATA:  Initial evaluation for acute right upper quadrant pain, nausea, vomiting EXAM: ULTRASOUND ABDOMEN LIMITED RIGHT UPPER QUADRANT COMPARISON:  Prior CT from earlier the same day. FINDINGS: Gallbladder: Multiple mobile stones present within  the gallbladder lumen, largest of which measures 3 cm. Gallbladder wall measure within normal limits at 2.8 mm. No free pericholecystic fluid. No sonographic Murphy sign elicited on exam. Common bile duct: Diameter: 6.4 mm, upper limits of normal Liver: No focal lesion identified. Within normal limits in parenchymal echogenicity. Portal vein is patent on color Doppler imaging with normal direction of blood flow towards the liver. IMPRESSION: 1. Cholelithiasis with no sonographic features to suggest acute cholecystitis. 2. Common bile duct measures at the upper limits of normal at approximately 6 mm. No choledocholithiasis. Electronically Signed   By: Rise MuBenjamin  McClintock M.D.   On: 10/28/2017 04:25    Microbiology: No results found for this or any previous visit (from the past 240 hour(s)).   Labs: Basic Metabolic Panel: Recent Labs  Lab 10/27/17 2155 10/28/17 1440  NA 137 138  K 3.7 3.9  CL 102 106  CO2 25 23  GLUCOSE 128* 119*  BUN 13 7  CREATININE 0.80 0.50  CALCIUM 9.4 8.6*   Liver Function Tests: Recent Labs  Lab 10/28/17 0100 10/28/17 1440  AST 137* 70*  ALT 81* 110*  ALKPHOS 57 59  BILITOT 4.0* 0.9  PROT 7.1 6.1*  ALBUMIN 4.3 3.6   Recent Labs  Lab 10/27/17 2155 10/28/17 1440  LIPASE 82* 38   No results for input(s): AMMONIA in the last 168 hours. CBC: Recent Labs  Lab 10/27/17 2155 10/28/17 0607  WBC 12.9* 8.6  NEUTROABS  --  5.5  HGB 12.6 12.4  HCT 37.5 37.3  MCV 89.1 89.0  PLT 320 296   Cardiac Enzymes: Recent Labs  Lab 10/28/17 0100 10/28/17 0607  CKTOTAL 172  --   TROPONINI  --  <0.03   BNP: BNP (last 3 results) No results for input(s): BNP in the last 8760 hours.  ProBNP (last 3 results) No results for input(s): PROBNP in the last 8760 hours.  CBG: No results for input(s): GLUCAP in the last 168 hours.     Signed:  Rhetta Mura MD   [facility] 10/28/2017, 4:08 PM

## 2017-10-28 NOTE — ED Notes (Addendum)
Spk w/ hosptialist (Dr. Mahala MenghiniSamtani) regarding pt status. Instructed that patient will be d/c'dhome and all discharge ppwk has been printed and orders placed. Pt to be discharged home; floor and charge nurse notified.

## 2017-10-28 NOTE — ED Notes (Signed)
PAGED ADMITTING PER RN  

## 2017-10-28 NOTE — ED Notes (Addendum)
Removed pt's foley, per Arline Aspindy - RN. No issues noted at this time.

## 2017-10-28 NOTE — ED Notes (Signed)
Got patient hooked up to the monitor patient is resting with call bell in reach 

## 2017-10-28 NOTE — ED Notes (Signed)
PT aware she has PRN orders for pain and nausea if needed. PT has call bell in each. Taken a drink, is aware breakfast has been ordered

## 2017-10-28 NOTE — ED Notes (Signed)
Pt arrives from ER with foley, aaox4 breakfast at bedside, able to eat by herself able to stand  transfer to bed

## 2017-10-28 NOTE — ED Notes (Signed)
Pt stated that she did urinate "a little bit."

## 2017-10-28 NOTE — ED Notes (Signed)
Please see downtime record.

## 2017-10-28 NOTE — ED Notes (Signed)
Admit dr into see pt, we are able to take out foley, dr informed pt has been eating and not nauseated, pt may go home ,  Possibly if labs are good and not nauseated

## 2017-10-28 NOTE — H&P (Addendum)
History and Physical    Sharon Johnson MWU:[id number] DOB: 1976-05-24 DOA: 10/27/2017  Referring MD/NP/PA: Dr. Glynn Octave PCP: Patient, No Pcp Per  Patient coming from: Home  Chief Complaint: Abdominal pain  I have personally briefly reviewed patient's old medical records in [facility]   HPI: Sharon Johnson is a 42 y.o. Spanish-speaking female with medical history significant of HTN and  Migraines; who presents with complaints of abdominal pain. Patient's daughter helps translate.  She had gone to [facility] to receive a vitamin infusion for the very first time to help with migraines and blood pressure at about 3:50 PM.  Her sister-in law had gone with her and received an infusion as well.  Around 6:00 PM, she started having severe sharp lower abdominal pain that radiated to her back.  Rates pain as a 8-9 out of 10. Associated symptoms included nausea, nonbloody emesis, and dizziness. Denies fever, chills, dysuria, urinary frequency. Patient had some complaints of chest pain, but that resolved quickly  ED Course: Upon admission into the emergency department patient was found to be afebrile, pulse 72-95, respiration 1525, blood pressure elevated up to 154/96, and O2 saturation maintained on room air.  Labs revealed WBC 12.9, lipase 82, AST 137, AT 81,  total bilirubin 4, and CK 172.  CT scan of the abdomen showed cholelithiasis with dilated common bile duct with visible stone.  Renal ultrasound was performed showing cholelithiasis without cholecystitis and common bile duct at the upper limits of normal.  TR[facility] called to admit.  Review of Systems  Constitutional: Positive for malaise/fatigue. Negative for chills and fever.  HENT: Negative for ear pain and tinnitus.   Eyes: Negative for pain and discharge.  Respiratory: Negative for cough and shortness of breath.   Cardiovascular: Positive for chest pain. Negative for leg swelling.    Gastrointestinal: Positive for abdominal pain, nausea and vomiting. Negative for diarrhea.  Genitourinary: Negative for dysuria and frequency.  Musculoskeletal: Positive for back pain. Negative for joint pain and myalgias.  Skin: Negative for itching and rash.  Neurological: Negative for focal weakness and loss of consciousness.  Endo/Heme/Allergies: Does not bruise/bleed easily.  Psychiatric/Behavioral: Negative for suicidal ideas. The patient does not have insomnia.     Past Medical History:  Diagnosis Date  . Hypertension   . Migraine     History reviewed. No pertinent surgical history.   reports that she has never smoked. She has never used smokeless tobacco. She reports that she does not drink alcohol or use drugs.  No Known Allergies  History reviewed. No pertinent family history.  Prior to Admission medications   Medication Sig Start Date End Date Taking? Authorizing Provider  cyclobenzaprine (FLEXERIL) 5 MG tablet Take 1 tablet (5 mg total) by mouth 3 (three) times daily as needed for muscle spasms. 12/25/16   Valarie Cones, Dema Severin, PA-C  ibuprofen (ADVIL,MOTRIN) 800 MG tablet Take 1 tablet (800 mg total) by mouth every 8 (eight) hours as needed (for pain.). 12/25/16   Valarie Cones, Dema Severin, PA-C  levonorgestrel (MIRENA) 20 MCG/24HR IUD 1 each by Intrauterine route once.    [provider]    Physical Exam:  Constitutional: NAD, calm, comfortable Vitals:   10/28/17 0130 10/28/17 0215 10/28/17 0315 10/28/17 0400  BP: (!) 145/93 138/70 112/63 124/81  Pulse: 75 74 74 72  Resp: 18 (!) 24 15 19   Temp:      TempSrc:      SpO2: 94% 94% 94%  97%  Weight:      Height:       Eyes: PERRL, lids and conjunctivae normal ENMT: Mucous membranes are dry. Posterior pharynx clear of any exudate or lesions.Normal dentition.  Neck: normal, supple, no masses, no thyromegaly Respiratory: clear to auscultation bilaterally, no wheezing, no crackles. Normal respiratory effort. No accessory  muscle use.  Cardiovascular: Regular rate and rhythm, no murmurs / rubs / gallops. No extremity edema. 2+ pedal pulses. No carotid bruits.  Abdomen: generalized abdominal tenderness most notable in RUQ, no masses palpated. No hepatosplenomegaly. Bowel sounds positive.  Musculoskeletal: no clubbing / cyanosis. No joint deformity upper and lower extremities. Good ROM, no contractures. Normal muscle tone.  Skin: no rashes, lesions, ulcers. No induration Neurologic: CN 2-12 grossly intact. Sensation intact, DTR normal. Strength 5/5 in all 4.  Psychiatric: Normal judgment and insight. Alert and oriented x 3. Normal mood.     Labs on Admission: I have personally reviewed following labs and imaging studies  CBC: Recent Labs  Lab 10/27/17 2155  WBC 12.9*  HGB 12.6  HCT 37.5  MCV 89.1  PLT 320   Basic Metabolic Panel: Recent Labs  Lab 10/27/17 2155  NA 137  K 3.7  CL 102  CO2 25  GLUCOSE 128*  BUN 13  CREATININE 0.80  CALCIUM 9.4   GFR: Estimated Creatinine Clearance: 88.4 mL/min (by C-G formula based on SCr of 0.8 mg/dL). Liver Function Tests: Recent Labs  Lab 10/28/17 0100  AST 137*  ALT 81*  ALKPHOS 57  BILITOT 4.0*  PROT 7.1  ALBUMIN 4.3   Recent Labs  Lab 10/27/17 2155  LIPASE 82*   No results for input(s): AMMONIA in the last 168 hours. Coagulation Profile: No results for input(s): INR, PROTIME in the last 168 hours. Cardiac Enzymes: Recent Labs  Lab 10/28/17 0100  CKTOTAL 172   BNP (last 3 results) No results for input(s): PROBNP in the last 8760 hours. HbA1C: No results for input(s): HGBA1C in the last 72 hours. CBG: No results for input(s): GLUCAP in the last 168 hours. Lipid Profile: No results for input(s): CHOL, HDL, LDLCALC, TRIG, CHOLHDL, LDLDIRECT in the last 72 hours. Thyroid Function Tests: No results for input(s): TSH, T4TOTAL, FREET4, T3FREE, THYROIDAB in the last 72 hours. Anemia Panel: No results for input(s): VITAMINB12, FOLATE,  FERRITIN, TIBC, IRON, RETICCTPCT in the last 72 hours. Urine analysis:    Component Value Date/Time   COLORURINE YELLOW 10/27/2017 2142   APPEARANCEUR CLEAR 10/27/2017 2142   LABSPEC 1.005 10/27/2017 2142   PHURINE 6.0 10/27/2017 2142   GLUCOSEU NEGATIVE 10/27/2017 2142   HGBUR MODERATE (A) 10/27/2017 2142   BILIRUBINUR NEGATIVE 10/27/2017 2142   KETONESUR NEGATIVE 10/27/2017 2142   PROTEINUR NEGATIVE 10/27/2017 2142   NITRITE NEGATIVE 10/27/2017 2142   LEUKOCYTESUR NEGATIVE 10/27/2017 2142   Sepsis Labs: No results found for this or any previous visit (from the past 240 hour(s)).   Radiological Exams on Admission: Ct Abdomen Pelvis W Contrast  Result Date: 10/28/2017 CLINICAL DATA:  Acute abdominal pain. EXAM: CT ABDOMEN AND PELVIS WITH CONTRAST TECHNIQUE: Multidetector CT imaging of the abdomen and pelvis was performed using the standard protocol following bolus administration of intravenous contrast. CONTRAST:  100 cc Isovue-300 IV COMPARISON:  None. FINDINGS: Lower chest: The lung bases are clear. Hepatobiliary: Decreased hepatic density consistent with steatosis. No focal lesion. Mild gallbladder distention without calcified stone or pericholecystic inflammation. Common bile duct is dilated measuring 10 mm. No visualized choledocholithiasis. Pancreas: No ductal  dilatation or inflammation. Spleen: Normal in size without focal abnormality. Adrenals/Urinary Tract: Normal adrenal glands. Bilateral hydroureteronephrosis. Heterogeneous enhancement of both kidneys. There are nonobstructing stones within both kidneys. No ureteral calculi. No perirenal or intrarenal fluid collection. Urinary bladder is distended. No bladder wall thickening. No bladder stone. Stomach/Bowel: Stomach is within normal limits. Appendix appears normal. No evidence of bowel wall thickening, distention, or inflammatory changes. Vascular/Lymphatic: No significant vascular findings are present. No enlarged abdominal or  pelvic lymph nodes. Reproductive: IUD appropriately positioned in the uterus. No adnexal mass. Other: No free air, free fluid, or intra-abdominal fluid collection. Musculoskeletal: There are no acute or suspicious osseous abnormalities. IMPRESSION: 1. Gallbladder and biliary dilatation without calcified gallstone or choledocholithiasis. Consider right upper quadrant ultrasound and possible MRCP for further evaluation. 2. Bilateral hydroureteronephrosis. Urinary bladder distention. Nonobstructing stones in both kidneys without ureteral stone. Recommend correlation for urinary retention or urinary tract infection. Electronically Signed   By: Rubye Oaks M.D.   On: 10/28/2017 02:36    EKG: Independently reviewed.  Normal sinus rhythm at 83 bpm  Assessment/Plan Nausea, vomiting, lower abdominal pain: Acute.  Symptoms started after a reported vitamin infusion. patient was noted to have cholelithiasis with biliary dilatation, but without sign of cholecystitis or choledocholithiasis.  Question if infusion stimulated gallbladder to cause with elevated lipase although no signs of inflammation of the pancreas at this time with patient's complaint of pain radiation to the back. - Admit to a MedSurg bed - Check urine drug screen  - IV fluids as tolerated - Diet as tolerated. - Antiemetics as needed - May warrant further evaluation into [place] [facility] - May want to discuss with GI  Transaminitis and hyperbilirubinemia: Acute.  Patient found to have elevated AST 137, ALT 81, total bilirubin 4 on admission, but denies any significant history of alcohol use.   - Check acute hepatitis panel - Recheck CMP  Cholelithiasis: As seen on CT scan.  Leukocytosis: Acute.  Initial WBC elevated at 12.9 on admission.  Suspect reactive to above as patient reports no other infectious type symptoms. - Monitoring off antibiotics at this time  Essential hypertension: Patient not on any  antihypertensive medications. - Continue to monitor   DVT prophylaxis: lovenox   Code Status: full  Family Communication: Discussed plan of care with the patient family present at bedside Disposition Plan: Likely discharge home in 1-2 days Consults called: None Admission status: observation   Clydie Braun MD [facility] Pager [phone]   If 7PM-7AM, please contact night-coverage [email or web address] Password [facility]  10/28/2017, 4:26 AM

## 2017-10-28 NOTE — ED Notes (Signed)
Pt discharged from ED; instructions provided; Pt encouraged to return to ED if symptoms worsen and to f/u with PCP; Pt verbalized understanding of all instructions 

## 2017-10-28 NOTE — ED Notes (Signed)
Gave pt ice water, per Arline Aspindy - Charity fundraiserN.

## 2017-10-28 NOTE — ED Provider Notes (Addendum)
[facility] EMERGENCY DEPARTMENT Provider Note   CSN: [id number] Arrival date & time: 10/27/17  2115     History   Chief Complaint Chief Complaint  Patient presents with  . Abdominal Pain  . Dizziness    HPI Sharon STURKEY is a 42 y.o. female.  Patient reports lower abdominal pain with nausea and vomiting onset today after receiving an "injection of vitamins" at the spinal infusion clinic.  She does not know the details on what the injection was or what it was for.  It was supposed to help her migraines and blood pressure.  It was recommended by 1 of her friends.  Her sister-in-law is here with the same symptoms after receiving the same injection.  About 1 hour after the injection, patient developed diffuse lower abdominal pain worse in the upper abdomen associated with nausea and vomiting.  She is also had some dizziness and lightheadedness.  No pain with urination or blood in the urine.  No vaginal bleeding or discharge.  No chest pain or shortness of breath.  The history is provided by the patient. The history is limited by a language barrier.  Abdominal Pain   Associated symptoms include nausea and vomiting. Pertinent negatives include fever, dysuria, hematuria, arthralgias and myalgias.  Dizziness  Associated symptoms: nausea and vomiting   Associated symptoms: no shortness of breath and no weakness     Past Medical History:  Diagnosis Date  . Hypertension   . Migraine     There are no active problems to display for this patient.   History reviewed. No pertinent surgical history.   OB History    Gravida  1   Para      Term      Preterm      AB      Living        SAB      TAB      Ectopic      Multiple      Live Births               Home Medications    Prior to Admission medications   Medication Sig Start Date End Date Taking? Authorizing Provider  cyclobenzaprine (FLEXERIL) 5 MG tablet Take 1 tablet (5 mg  total) by mouth 3 (three) times daily as needed for muscle spasms. 12/25/16   Valarie Cones, Dema Severin, PA-C  ibuprofen (ADVIL,MOTRIN) 800 MG tablet Take 1 tablet (800 mg total) by mouth every 8 (eight) hours as needed (for pain.). 12/25/16   Valarie Cones, Dema Severin, PA-C  levonorgestrel (MIRENA) 20 MCG/24HR IUD 1 each by Intrauterine route once.    [provider]    Family History No family history on file.  Social History Social History   Tobacco Use  . Smoking status: Never Smoker  . Smokeless tobacco: Never Used  Substance Use Topics  . Alcohol use: No    Alcohol/week: 0.0 oz  . Drug use: No     Allergies   Patient has no known allergies.   Review of Systems Review of Systems  Constitutional: Positive for activity change and appetite change. Negative for fever.  Respiratory: Negative for cough, chest tightness and shortness of breath.   Gastrointestinal: Positive for abdominal pain, nausea and vomiting.  Genitourinary: Negative for dysuria, hematuria, vaginal bleeding and vaginal discharge.  Musculoskeletal: Positive for back pain. Negative for arthralgias and myalgias.  Skin: Negative for rash.  Neurological: Positive for dizziness. Negative for weakness.  all other systems are negative except as noted in the HPI and PMH.    Physical Exam Updated Vital Signs BP 122/70   Pulse 78   Temp 97.6 F (36.4 C) (Oral)   Resp (!) 23   Ht 5\' 2"  (1.575 m)   Wt 77.6 kg (171 lb)   LMP 10/06/2017   SpO2 98%   Breastfeeding? Unknown   BMI 31.28 kg/m   Physical Exam  Constitutional: She is oriented to person, place, and time. She appears well-developed and well-nourished. No distress.  HENT:  Head: Normocephalic and atraumatic.  Mouth/Throat: Oropharynx is clear and moist. No oropharyngeal exudate.  Eyes: Pupils are equal, round, and reactive to light. Conjunctivae and EOM are normal.  Neck: Normal range of motion. Neck supple.  No meningismus.  Cardiovascular: Normal rate,  regular rhythm, normal heart sounds and intact distal pulses.  No murmur heard. Pulmonary/Chest: Effort normal and breath sounds normal. No respiratory distress.  Abdominal: Soft. There is tenderness. There is no rebound and no guarding.  Diffuse tenderness with voluntary guarding, worse in the right upper quadrant  Musculoskeletal: Normal range of motion. She exhibits no edema or tenderness.  Neurological: She is alert and oriented to person, place, and time. No cranial nerve deficit. She exhibits normal muscle tone. Coordination normal.  No ataxia on finger to nose bilaterally. No pronator drift. 5/5 strength throughout. CN 2-12 intact.Equal grip strength. Sensation intact.   Skin: Skin is warm.  Psychiatric: She has a normal mood and affect. Her behavior is normal.  Nursing note and vitals reviewed.    ED Treatments / Results  Labs (all labs ordered are listed, but only abnormal results are displayed) Labs Reviewed  BASIC METABOLIC PANEL - Abnormal; Notable for the following components:      Result Value   Glucose, Bld 128 (*)    All other components within normal limits  CBC - Abnormal; Notable for the following components:   WBC 12.9 (*)    All other components within normal limits  URINALYSIS, ROUTINE W REFLEX MICROSCOPIC - Abnormal; Notable for the following components:   Hgb urine dipstick MODERATE (*)    Bacteria, UA FEW (*)    Squamous Epithelial / LPF 0-5 (*)    All other components within normal limits  LIPASE, BLOOD - Abnormal; Notable for the following components:   Lipase 82 (*)    All other components within normal limits  HEPATIC FUNCTION PANEL - Abnormal; Notable for the following components:   AST 137 (*)    ALT 81 (*)    Total Bilirubin 4.0 (*)    Bilirubin, Direct 1.3 (*)    Indirect Bilirubin 2.7 (*)    All other components within normal limits  URINALYSIS, ROUTINE W REFLEX MICROSCOPIC - Abnormal; Notable for the following components:   Color, Urine  STRAW (*)    All other components within normal limits  URINE CULTURE  CK  CBC WITH DIFFERENTIAL/PLATELET  HIV ANTIBODY (ROUTINE TESTING)  RAPID URINE DRUG SCREEN, HOSP PERFORMED  HEPATITIS PANEL, ACUTE  TROPONIN I  I-STAT BETA HCG BLOOD, ED ([facility], [facility], [facility] ONLY)  I-STAT BETA HCG BLOOD, ED ([facility], [facility], [facility] ONLY)    EKG EKG Interpretation  Date/Time:  Wednesday October 27 2017 21:46:55 EDT Ventricular Rate:  83 PR Interval:  180 QRS Duration: 86 QT Interval:  372 QTC Calculation: 437 R Axis:   22 Text Interpretation:  Normal sinus rhythm Cannot rule out Anterior infarct , age undetermined Abnormal ECG No  previous ECGs available Confirmed by Glynn Octaveancour, Amaura Authier [id number]) on 10/28/2017 12:10:31 AM   Radiology Ct Abdomen Pelvis W Contrast  Result Date: 10/28/2017 CLINICAL DATA:  Acute abdominal pain. EXAM: CT ABDOMEN AND PELVIS WITH CONTRAST TECHNIQUE: Multidetector CT imaging of the abdomen and pelvis was performed using the standard protocol following bolus administration of intravenous contrast. CONTRAST:  100 cc Isovue-300 IV COMPARISON:  None. FINDINGS: Lower chest: The lung bases are clear. Hepatobiliary: Decreased hepatic density consistent with steatosis. No focal lesion. Mild gallbladder distention without calcified stone or pericholecystic inflammation. Common bile duct is dilated measuring 10 mm. No visualized choledocholithiasis. Pancreas: No ductal dilatation or inflammation. Spleen: Normal in size without focal abnormality. Adrenals/Urinary Tract: Normal adrenal glands. Bilateral hydroureteronephrosis. Heterogeneous enhancement of both kidneys. There are nonobstructing stones within both kidneys. No ureteral calculi. No perirenal or intrarenal fluid collection. Urinary bladder is distended. No bladder wall thickening. No bladder stone. Stomach/Bowel: Stomach is within normal limits. Appendix appears normal. No evidence of bowel wall thickening, distention, or inflammatory changes.  Vascular/Lymphatic: No significant vascular findings are present. No enlarged abdominal or pelvic lymph nodes. Reproductive: IUD appropriately positioned in the uterus. No adnexal mass. Other: No free air, free fluid, or intra-abdominal fluid collection. Musculoskeletal: There are no acute or suspicious osseous abnormalities. IMPRESSION: 1. Gallbladder and biliary dilatation without calcified gallstone or choledocholithiasis. Consider right upper quadrant ultrasound and possible MRCP for further evaluation. 2. Bilateral hydroureteronephrosis. Urinary bladder distention. Nonobstructing stones in both kidneys without ureteral stone. Recommend correlation for urinary retention or urinary tract infection. Electronically Signed   By: Rubye OaksMelanie  Ehinger M.D.   On: 10/28/2017 02:36   [place] Abdomen Limited  Result Date: 10/28/2017 CLINICAL DATA:  Initial evaluation for acute right upper quadrant pain, nausea, vomiting EXAM: ULTRASOUND ABDOMEN LIMITED RIGHT UPPER QUADRANT COMPARISON:  Prior CT from earlier the same day. FINDINGS: Gallbladder: Multiple mobile stones present within the gallbladder lumen, largest of which measures 3 cm. Gallbladder wall measure within normal limits at 2.8 mm. No free pericholecystic fluid. No sonographic Murphy sign elicited on exam. Common bile duct: Diameter: 6.4 mm, upper limits of normal Liver: No focal lesion identified. Within normal limits in parenchymal echogenicity. Portal vein is patent on color Doppler imaging with normal direction of blood flow towards the liver. IMPRESSION: 1. Cholelithiasis with no sonographic features to suggest acute cholecystitis. 2. Common bile duct measures at the upper limits of normal at approximately 6 mm. No choledocholithiasis. Electronically Signed   By: Rise MuBenjamin  McClintock M.D.   On: 10/28/2017 04:25    Procedures Procedures (including critical care time)  Medications Ordered in ED Medications  sodium chloride 0.9 % bolus 1,000 mL (has no  administration in time range)  ondansetron (ZOFRAN) injection 4 mg (has no administration in time range)  morphine 4 MG/ML injection 4 mg (has no administration in time range)     Initial Impression / Assessment and Plan / ED Course  I have reviewed the triage vital signs and the nursing notes.  Pertinent labs & imaging results that were available during my care of the patient were reviewed by me and considered in my medical decision making (see chart for details).    Patient with upper abdominal pain, nausea and vomiting after receiving vitamin infusion.  Sister here with similar symptoms after similar infusion. Abdomen soft without peritoneal signs.  Labs show elevated bilirubin and lipase. Imaging shows cholelithiasis without cholecystitis.  CBD appears mildly dilated. CT also shows bilateral hydronephrosis.  After attempted urination, patient still  has 350 and bladder.  Will Place Foley catheter.  Patient given additional IV fluids and symptom control.  Unsure etiology of symptoms but may be related to vitamin infusion causing possible passage of gallstone.  Given elevated LFTs and ongoing symptoms, admission discussed with Dr. Katrinka Blazing.  Final Clinical Impressions(s) / ED Diagnoses   Final diagnoses:  Gallstones  Elevated bilirubin    ED Discharge Orders    None       Leenah Seidner, Jeannett Senior, MD 10/28/17 1610    Glynn Octave, MD 11/19/17 1859

## 2017-10-28 NOTE — ED Notes (Signed)
Ordered pt's lunch tray. 

## 2017-10-29 LAB — URINE CULTURE: CULTURE: NO GROWTH

## 2017-10-29 LAB — HEPATITIS PANEL, ACUTE
HEP B S AG: NEGATIVE
Hep A IgM: NEGATIVE
Hep B C IgM: NEGATIVE

## 2017-11-05 ENCOUNTER — Other Ambulatory Visit: Payer: Self-pay

## 2017-11-05 ENCOUNTER — Encounter: Payer: Self-pay | Admitting: Family Medicine

## 2017-11-05 ENCOUNTER — Ambulatory Visit: Payer: Self-pay | Admitting: Family Medicine

## 2017-11-05 VITALS — BP 132/60 | HR 82 | Temp 98.6°F | Ht 62.99 in | Wt 175.2 lb

## 2017-11-05 DIAGNOSIS — R945 Abnormal results of liver function studies: Secondary | ICD-10-CM

## 2017-11-05 DIAGNOSIS — R339 Retention of urine, unspecified: Secondary | ICD-10-CM

## 2017-11-05 DIAGNOSIS — R7989 Other specified abnormal findings of blood chemistry: Secondary | ICD-10-CM

## 2017-11-05 DIAGNOSIS — N200 Calculus of kidney: Secondary | ICD-10-CM

## 2017-11-05 DIAGNOSIS — K760 Fatty (change of) liver, not elsewhere classified: Secondary | ICD-10-CM | POA: Insufficient documentation

## 2017-11-05 DIAGNOSIS — M545 Low back pain: Secondary | ICD-10-CM

## 2017-11-05 DIAGNOSIS — K8020 Calculus of gallbladder without cholecystitis without obstruction: Secondary | ICD-10-CM

## 2017-11-05 LAB — POCT URINALYSIS DIP (MANUAL ENTRY)
Bilirubin, UA: NEGATIVE
Glucose, UA: NEGATIVE mg/dL
Ketones, POC UA: NEGATIVE mg/dL
Leukocytes, UA: NEGATIVE
Nitrite, UA: NEGATIVE
Protein Ur, POC: NEGATIVE mg/dL
Spec Grav, UA: 1.02 (ref 1.010–1.025)
Urobilinogen, UA: 0.2 E.U./dL
pH, UA: 6 (ref 5.0–8.0)

## 2017-11-05 MED ORDER — TAMSULOSIN HCL 0.4 MG PO CAPS: 0.4000 mg | ORAL_CAPSULE | Freq: Every day | ORAL | 0 refills | Status: DC

## 2017-11-05 MED ORDER — IBUPROFEN 800 MG PO TABS: 800.0000 mg | ORAL_TABLET | Freq: Three times a day (TID) | ORAL | 0 refills | Status: DC | PRN

## 2017-11-05 NOTE — Patient Instructions (Signed)
     IF you received an x-ray today, you will receive an invoice from [facility]. Please contact [facility] at [phone] with questions or concerns regarding your invoice.   IF you received labwork today, you will receive an invoice from [facility]. Please contact [facility] at [phone] with questions or concerns regarding your invoice.   Our billing staff will not be able to assist you with questions regarding bills from these companies.  You will be contacted with the lab results as soon as they are available. The fastest way to get your results is to activate your My Chart account. Instructions are located on the last page of this paperwork. If you have not heard from us regarding the results in 2 weeks, please contact this office.     

## 2017-11-05 NOTE — Progress Notes (Signed)
4/19/201910:46 AM  Sharon Johnson [birth year], 42 y.o. female [id number]  Chief Complaint  Patient presents with  . Follow-up    follow up from ED visit, having nausea, headaches, vomiting and stomach pain. Requesting lab, ER doctor informed her that she has inflammed organs    HPI:   Patient is a 42 y.o. female who presents today for ED followup  On April 10th she had a "vitamin infusion" at a local health clinic.  Later that day she started having nausea, abd pain, bloating, vomiting, headaches, low back pain Her sister in law had the same symptoms, she also received a vitamin infusion Patient does not know what was in that infusion, it was recommend by a friend from church, it was meant to helps her headaches and blood pressure   Seen on 10/27/17 - ED, observed overnight Elevated LFTs, lipase, WBC Low calcium Lipase and WBC normalized prior to dc, LFTs were down trending Neg acute hepatitis panel Neg UDS except for opiates given in ER CT scan abd and pelvis w contrast:  IMPRESSION: 1. Gallbladder and biliary dilatation without calcified gallstone or choledocholithiasis. Consider right upper quadrant ultrasound and possible MRCP for further evaluation. 2. Bilateral hydroureteronephrosis. Urinary bladder distention. Nonobstructing stones in both kidneys without ureteral stone. Recommend correlation for urinary retention or urinary tract Infection.  [place] abd limited: IMPRESSION: 1. Cholelithiasis with no sonographic features to suggest acute cholecystitis. 2. Common bile duct measures at the upper limits of normal at approximately 6 mm. No choledocholithiasis  She reports overall not feeling much better Still having low back pain, headaches, and nausea She reports these headaches are different to her typical headaches She reports having some issues with emptying her bladder, at times needs to strain She denies any dysuria or hematuria She denies having any  urinary issues until the infusion She denies any RUQ pain, fever or chills  Depression screen [facility] 2/9 11/05/2017 10/31/2016  Decreased Interest 0 0  Down, Depressed, Hopeless 0 0  PHQ - 2 Score 0 0    No Known Allergies  Prior to Admission medications   Medication Sig Start Date End Date Taking? Authorizing Provider  ibuprofen (ADVIL,MOTRIN) 800 MG tablet Take 1 tablet (800 mg total) by mouth every 8 (eight) hours as needed (for pain.). 12/25/16  Yes Weber, Dema Severin, PA-C  levonorgestrel (MIRENA) 20 MCG/24HR IUD 1 each by Intrauterine route once.   Yes [provider]    Past Medical History:  Diagnosis Date  . Hypertension   . Migraine     History reviewed. No pertinent surgical history.  Social History   Tobacco Use  . Smoking status: Never Smoker  . Smokeless tobacco: Never Used  Substance Use Topics  . Alcohol use: No    Alcohol/week: 0.0 oz    Family History  Problem Relation Age of Onset  . Ovarian cancer Mother     ROS Per hpi  OBJECTIVE:  Blood pressure 132/60, pulse 82, temperature 98.6 F (37 C), temperature source Oral, height 5' 2.99" (1.6 m), weight 175 lb 3.2 oz (79.5 kg), last menstrual period 10/06/2017, SpO2 99 %, unknown if currently breastfeeding.  Physical Exam  Constitutional: She is oriented to person, place, and time.  HENT:  Head: Normocephalic and atraumatic.  Mouth/Throat: Oropharynx is clear and moist. No oropharyngeal exudate.  Eyes: Pupils are equal, round, and reactive to light. EOM are normal. No scleral icterus.  Neck: Neck supple.  Cardiovascular: Normal rate, regular rhythm and normal heart sounds.  Exam reveals no gallop and no friction rub.  No murmur heard. Pulmonary/Chest: Effort normal and breath sounds normal. She has no wheezes. She has no rales.  Abdominal: Soft. Bowel sounds are normal. She exhibits no distension and no mass. There is generalized tenderness. There is no rebound, no guarding and negative Murphy's  sign.  Musculoskeletal: She exhibits no edema.  Neurological: She is alert and oriented to person, place, and time.  Skin: Skin is warm and dry.     Results for orders placed or performed in visit on 11/05/17 (from the past 24 hour(s))  POCT urinalysis dipstick     Status: Abnormal   Collection Time: 11/05/17 10:32 AM  Result Value Ref Range   Color, UA yellow yellow   Clarity, UA clear clear   Glucose, UA negative negative mg/dL   Bilirubin, UA negative negative   Ketones, POC UA negative negative mg/dL   Spec Grav, UA [id number] - 1.025   Blood, UA trace-intact (A) negative   pH, UA 6.0 5.0 - 8.0   Protein Ur, POC negative negative mg/dL   Urobilinogen, UA 0.2 0.2 or 1.0 E.U./dL   Nitrite, UA Negative Negative   Leukocytes, UA Negative Negative    ASSESSMENT and PLAN  1. Low back pain, unspecified back pain laterality, unspecified chronicity, with sciatica presence unspecified - POCT urinalysis dipstick  2. Calculus of gallbladder without cholecystitis without obstruction  3. Abnormal LFTs - CBC with Differential - Comprehensive metabolic panel  4. Fatty liver  5. Kidney stones - ibuprofen (ADVIL,MOTRIN) 800 MG tablet; Take 1 tablet (800 mg total) by mouth every 8 (eight) hours as needed (for pain.).  6. Urinary retention - Urine Culture  Other orders - tamsulosin (FLOMAX) 0.4 MG CAPS capsule; Take 1 capsule (0.4 mg total) by mouth daily.  Non-obstructing GB and kidney stones are incidental findings, as she had not had any issues prior. Seems all symptoms are related to vitamin infusion, including elevation in LFTs. Repeating labs today. Providing flomax to assist with urinary retention. RTC precautions reviewed.   Return in about 1 week (around 11/12/2017).    Myles LippsIrma M Santiago, MD [facility] [address] [place], [place] [address] Ph.  [phone] Fax [phone]

## 2017-11-06 LAB — CBC WITH DIFFERENTIAL/PLATELET
Basophils Absolute: 0 10*3/uL (ref 0.0–0.2)
Basos: 1 %
EOS (ABSOLUTE): 0.4 10*3/uL (ref 0.0–0.4)
Eos: 6 %
Hematocrit: 39.6 % (ref 34.0–46.6)
Hemoglobin: 13.2 g/dL (ref 11.1–15.9)
Immature Grans (Abs): 0 10*3/uL (ref 0.0–0.1)
Immature Granulocytes: 1 %
Lymphocytes Absolute: 3 10*3/uL (ref 0.7–3.1)
Lymphs: 45 %
MCH: 29.6 pg (ref 26.6–33.0)
MCHC: 33.3 g/dL (ref 31.5–35.7)
MCV: 89 fL (ref 79–97)
Monocytes Absolute: 0.5 10*3/uL (ref 0.1–0.9)
Monocytes: 8 %
Neutrophils Absolute: 2.5 10*3/uL (ref 1.4–7.0)
Neutrophils: 39 %
Platelets: 358 10*3/uL (ref 150–379)
RBC: 4.46 x10E6/uL (ref 3.77–5.28)
RDW: 14.5 % (ref 12.3–15.4)
WBC: 6.5 10*3/uL (ref 3.4–10.8)

## 2017-11-06 LAB — COMPREHENSIVE METABOLIC PANEL
ALT: 24 IU/L (ref 0–32)
AST: 18 IU/L (ref 0–40)
Albumin/Globulin Ratio: 1.8 (ref 1.2–2.2)
Albumin: 4.8 g/dL (ref 3.5–5.5)
Alkaline Phosphatase: 61 IU/L (ref 39–117)
BUN/Creatinine Ratio: 15 (ref 9–23)
BUN: 8 mg/dL (ref 6–24)
Bilirubin Total: 0.6 mg/dL (ref 0.0–1.2)
CO2: 20 mmol/L (ref 20–29)
Calcium: 9.8 mg/dL (ref 8.7–10.2)
Chloride: 103 mmol/L (ref 96–106)
Creatinine, Ser: 0.55 mg/dL — ABNORMAL LOW (ref 0.57–1.00)
GFR calc Af Amer: 134 mL/min/{1.73_m2} (ref >59)
GFR calc non Af Amer: 116 mL/min/{1.73_m2} (ref >59)
Globulin, Total: 2.6 g/dL (ref 1.5–4.5)
Glucose: 86 mg/dL (ref 65–99)
Potassium: 4.5 mmol/L (ref 3.5–5.2)
Sodium: 141 mmol/L (ref 134–144)
Total Protein: 7.4 g/dL (ref 6.0–8.5)

## 2017-11-06 LAB — URINE CULTURE

## 2017-11-08 ENCOUNTER — Encounter (HOSPITAL_COMMUNITY): Payer: Self-pay | Admitting: Emergency Medicine

## 2017-11-08 ENCOUNTER — Emergency Department (HOSPITAL_COMMUNITY): Payer: Medicaid Other

## 2017-11-08 ENCOUNTER — Ambulatory Visit (HOSPITAL_COMMUNITY)
Admission: EM | Admit: 2017-11-08 | Discharge: 2017-11-08 | Disposition: A | Discharge status: Home / Self Care | Payer: Self-pay | Attending: Family Medicine | Admitting: Family Medicine

## 2017-11-08 ENCOUNTER — Encounter (HOSPITAL_COMMUNITY): Payer: Self-pay | Admitting: Family Medicine

## 2017-11-08 ENCOUNTER — Inpatient Hospital Stay (HOSPITAL_COMMUNITY)
Admission: EM | Admit: 2017-11-08 | Discharge: 2017-11-13 | DRG: 854 | Disposition: A | Discharge status: Home Health | Payer: Medicaid Other | Attending: Internal Medicine | Admitting: Internal Medicine

## 2017-11-08 DIAGNOSIS — Z6831 Body mass index (BMI) 31.0-31.9, adult: Secondary | ICD-10-CM

## 2017-11-08 DIAGNOSIS — Z79899 Other long term (current) drug therapy: Secondary | ICD-10-CM

## 2017-11-08 DIAGNOSIS — I10 Essential (primary) hypertension: Secondary | ICD-10-CM | POA: Diagnosis present

## 2017-11-08 DIAGNOSIS — R1030 Lower abdominal pain, unspecified: Secondary | ICD-10-CM | POA: Diagnosis not present

## 2017-11-08 DIAGNOSIS — K8001 Calculus of gallbladder with acute cholecystitis with obstruction: Secondary | ICD-10-CM | POA: Diagnosis present

## 2017-11-08 DIAGNOSIS — R748 Abnormal levels of other serum enzymes: Secondary | ICD-10-CM | POA: Diagnosis not present

## 2017-11-08 DIAGNOSIS — N132 Hydronephrosis with renal and ureteral calculous obstruction: Secondary | ICD-10-CM | POA: Diagnosis present

## 2017-11-08 DIAGNOSIS — A419 Sepsis, unspecified organism: Secondary | ICD-10-CM | POA: Diagnosis present

## 2017-11-08 DIAGNOSIS — R338 Other retention of urine: Secondary | ICD-10-CM

## 2017-11-08 DIAGNOSIS — E876 Hypokalemia: Secondary | ICD-10-CM | POA: Diagnosis not present

## 2017-11-08 DIAGNOSIS — R1084 Generalized abdominal pain: Secondary | ICD-10-CM

## 2017-11-08 DIAGNOSIS — K810 Acute cholecystitis: Secondary | ICD-10-CM

## 2017-11-08 DIAGNOSIS — R6510 Systemic inflammatory response syndrome (SIRS) of non-infectious origin without acute organ dysfunction: Secondary | ICD-10-CM | POA: Diagnosis present

## 2017-11-08 DIAGNOSIS — R52 Pain, unspecified: Secondary | ICD-10-CM

## 2017-11-08 DIAGNOSIS — R339 Retention of urine, unspecified: Secondary | ICD-10-CM | POA: Diagnosis present

## 2017-11-08 DIAGNOSIS — E669 Obesity, unspecified: Secondary | ICD-10-CM | POA: Diagnosis present

## 2017-11-08 DIAGNOSIS — R110 Nausea: Secondary | ICD-10-CM

## 2017-11-08 LAB — COMPREHENSIVE METABOLIC PANEL
ALK PHOS: 59 U/L (ref 38–126)
ALT: 26 U/L (ref 14–54)
AST: 18 U/L (ref 15–41)
Albumin: 4.4 g/dL (ref 3.5–5.0)
Anion gap: 11 (ref 5–15)
BUN: 7 mg/dL (ref 6–20)
CALCIUM: 9.2 mg/dL (ref 8.9–10.3)
CO2: 21 mmol/L — ABNORMAL LOW (ref 22–32)
CREATININE: 0.36 mg/dL — AB (ref 0.44–1.00)
Chloride: 103 mmol/L (ref 101–111)
GFR calc Af Amer: >60 mL/min (ref >60)
GFR calc non Af Amer: >60 mL/min (ref >60)
GLUCOSE: 118 mg/dL — AB (ref 65–99)
Potassium: 3.6 mmol/L (ref 3.5–5.1)
Sodium: 135 mmol/L (ref 135–145)
Total Bilirubin: 0.5 mg/dL (ref 0.3–1.2)
Total Protein: 7.7 g/dL (ref 6.5–8.1)

## 2017-11-08 LAB — CBC
HCT: 41 % (ref 36.0–46.0)
Hemoglobin: 13.9 g/dL (ref 12.0–15.0)
MCH: 30.1 pg (ref 26.0–34.0)
MCHC: 33.9 g/dL (ref 30.0–36.0)
MCV: 88.7 fL (ref 78.0–100.0)
Platelets: 273 10*3/uL (ref 150–400)
RBC: 4.62 MIL/uL (ref 3.87–5.11)
RDW: 13.7 % (ref 11.5–15.5)
WBC: 12.9 10*3/uL — ABNORMAL HIGH (ref 4.0–10.5)

## 2017-11-08 LAB — URINALYSIS, ROUTINE W REFLEX MICROSCOPIC
Bilirubin Urine: NEGATIVE
GLUCOSE, UA: NEGATIVE mg/dL
Ketones, ur: NEGATIVE mg/dL
Leukocytes, UA: NEGATIVE
Nitrite: NEGATIVE
PROTEIN: NEGATIVE mg/dL
SPECIFIC GRAVITY, URINE: 1.002 — AB (ref 1.005–1.030)
pH: 7 (ref 5.0–8.0)

## 2017-11-08 LAB — LIPASE, BLOOD: Lipase: 29 U/L (ref 11–51)

## 2017-11-08 LAB — I-STAT CG4 LACTIC ACID, ED: LACTIC ACID, VENOUS: 1.41 mmol/L (ref 0.5–1.9)

## 2017-11-08 LAB — I-STAT BETA HCG BLOOD, ED (MC, WL, AP ONLY): I-stat hCG, quantitative: 5.5 m[IU]/mL — ABNORMAL HIGH (ref <5)

## 2017-11-08 LAB — HCG, QUANTITATIVE, PREGNANCY: hCG, Beta Chain, Quant, S: <1 m[IU]/mL (ref <5)

## 2017-11-08 MED ORDER — METRONIDAZOLE IN NACL 5-0.79 MG/ML-% IV SOLN
500.0000 mg | Freq: Once | INTRAVENOUS | Status: AC
  Administered 2017-11-08: 500 mg via INTRAVENOUS
  Filled 2017-11-08: qty 100

## 2017-11-08 MED ORDER — SODIUM CHLORIDE 0.9 % IV BOLUS
500.0000 mL | Freq: Once | INTRAVENOUS | Status: AC
  Administered 2017-11-08: 500 mL via INTRAVENOUS

## 2017-11-08 MED ORDER — SODIUM CHLORIDE 0.9 % IV BOLUS
1000.0000 mL | Freq: Once | INTRAVENOUS | Status: AC
  Administered 2017-11-08: 1000 mL via INTRAVENOUS

## 2017-11-08 MED ORDER — IOPAMIDOL (ISOVUE-300) INJECTION 61%
100.0000 mL | Freq: Once | INTRAVENOUS | Status: AC | PRN
  Administered 2017-11-08: 100 mL via INTRAVENOUS

## 2017-11-08 MED ORDER — SODIUM CHLORIDE 0.9 % IV SOLN
2.0000 g | Freq: Once | INTRAVENOUS | Status: AC
  Administered 2017-11-08: 2 g via INTRAVENOUS
  Filled 2017-11-08: qty 20

## 2017-11-08 MED ORDER — MORPHINE SULFATE (PF) 4 MG/ML IV SOLN
4.0000 mg | Freq: Once | INTRAVENOUS | Status: AC
  Administered 2017-11-08: 4 mg via INTRAVENOUS
  Filled 2017-11-08: qty 1

## 2017-11-08 MED ORDER — ONDANSETRON HCL 4 MG/2ML IJ SOLN
4.0000 mg | Freq: Once | INTRAMUSCULAR | Status: AC
  Administered 2017-11-08: 4 mg via INTRAVENOUS
  Filled 2017-11-08: qty 2

## 2017-11-08 MED ORDER — IOPAMIDOL (ISOVUE-300) INJECTION 61%
INTRAVENOUS | Status: AC
  Filled 2017-11-08: qty 100

## 2017-11-08 MED ORDER — ACETAMINOPHEN 325 MG PO TABS
650.0000 mg | ORAL_TABLET | Freq: Once | ORAL | Status: AC | PRN
  Administered 2017-11-08: 650 mg via ORAL
  Filled 2017-11-08: qty 2

## 2017-11-08 NOTE — Discharge Instructions (Addendum)
[facility]

## 2017-11-08 NOTE — ED Provider Notes (Addendum)
[facility]   [id number] 11/08/17 Arrival Time: 1128   SUBJECTIVE:  Sharon Johnson is a 42 y.o. female who presents to the urgent care with complaint of lower abd pain, back pain and fever. She was just seen in the ER. She is currently taking flomax. Having some dysuria  Patient has been having lower abdominal and back pain for 10 days.  She was seen at [facility] and found to have dilated ureters and incomplete bladder emptying.  She was sent home with pain medicine and Flomax but the pain has steadily increased.  Patient then went to [facility] where more Flomax was ordered.  She is crying and quite uncomfortable  She is G2P2  Past Medical History:  Diagnosis Date  . Hypertension   . Migraine    Family History  Problem Relation Age of Onset  . Ovarian cancer Mother    Social History   Socioeconomic History  . Marital status: Single    Spouse name: Not on file  . Number of children: Not on file  . Years of education: Not on file  . Highest education level: Not on file  Occupational History  . Not on file  Social Needs  . Financial resource strain: Not on file  . Food insecurity:    Worry: Not on file    Inability: Not on file  . Transportation needs:    Medical: Not on file    Non-medical: Not on file  Tobacco Use  . Smoking status: Never Smoker  . Smokeless tobacco: Never Used  Substance and Sexual Activity  . Alcohol use: No    Alcohol/week: 0.0 oz  . Drug use: No  . Sexual activity: Not Currently  Lifestyle  . Physical activity:    Days per week: Not on file    Minutes per session: Not on file  . Stress: Not on file  Relationships  . Social connections:    Talks on phone: Not on file    Gets together: Not on file    Attends religious service: Not on file    Active member of club or organization: Not on file    Attends meetings of clubs or organizations: Not on file    Relationship status: Not on file  . Intimate partner  violence:    Fear of current or ex partner: Not on file    Emotionally abused: Not on file    Physically abused: Not on file    Forced sexual activity: Not on file  Other Topics Concern  . Not on file  Social History Narrative   houskeeping   No outpatient medications have been marked as taking for the 11/08/17 encounter [facility] Encounter).   No Known Allergies    ROS: As per HPI, remainder of ROS negative.   OBJECTIVE:   Vitals:   11/08/17 1243  BP: 135/75  Pulse: (!) 107  Resp: 20  Temp: 100.2 F (37.9 C)  SpO2: 99%     General appearance: alert; no distress Eyes: PERRL; EOMI; conjunctiva normal HENT: normocephalic; atraumatic;oral mucosa normal Neck: supple Lungs: clear to auscultation bilaterally Heart: regular rate and rhythm Abdomen: soft, very tender lower abdomen; bowel sounds normal; no masses or organomegaly; no guarding or rebound tenderness Back: no CVA tenderness Extremities: no cyanosis or edema; symmetrical with no gross deformities Skin: warm and dry Neurologic: normal gait; grossly normal Psychological: alert and cooperative; normal mood and affect      Labs:  Results for orders placed  or performed in visit on 11/05/17  Urine Culture  Result Value Ref Range   Urine Culture, Routine Final report    Organism ID, Bacteria Comment   CBC with Differential  Result Value Ref Range   WBC 6.5 3.4 - 10.8 x10E3/uL   RBC 4.46 3.77 - 5.28 x10E6/uL   Hemoglobin 13.2 11.1 - 15.9 g/dL   Hematocrit 16.1 09.6 - 46.6 %   MCV 89 79 - 97 fL   MCH 29.6 26.6 - 33.0 pg   MCHC 33.3 31.5 - 35.7 g/dL   RDW 04.5 40.9 - 81.1 %   Platelets 358 150 - 379 x10E3/uL   Neutrophils 39 Not Estab. %   Lymphs 45 Not Estab. %   Monocytes 8 Not Estab. %   Eos 6 Not Estab. %   Basos 1 Not Estab. %   Neutrophils Absolute 2.5 1.4 - 7.0 x10E3/uL   Lymphocytes Absolute 3.0 0.7 - 3.1 x10E3/uL   Monocytes Absolute 0.5 0.1 - 0.9 x10E3/uL   EOS (ABSOLUTE) 0.4 0.0 - 0.4  x10E3/uL   Basophils Absolute 0.0 0.0 - 0.2 x10E3/uL   Immature Granulocytes 1 Not Estab. %   Immature Grans (Abs) 0.0 0.0 - 0.1 x10E3/uL  Comprehensive metabolic panel  Result Value Ref Range   Glucose 86 65 - 99 mg/dL   BUN 8 6 - 24 mg/dL   Creatinine, Ser 9.14 (L) 0.57 - 1.00 mg/dL   GFR calc non Af Amer 116 >59 mL/min/1.73   GFR calc Af Amer 134 >59 mL/min/1.73   BUN/Creatinine Ratio 15 9 - 23   Sodium 141 134 - 144 mmol/L   Potassium 4.5 3.5 - 5.2 mmol/L   Chloride 103 96 - 106 mmol/L   CO2 20 20 - 29 mmol/L   Calcium 9.8 8.7 - 10.2 mg/dL   Total Protein 7.4 6.0 - 8.5 g/dL   Albumin 4.8 3.5 - 5.5 g/dL   Globulin, Total 2.6 1.5 - 4.5 g/dL   Albumin/Globulin Ratio 1.8 1.2 - 2.2   Bilirubin Total 0.6 0.0 - 1.2 mg/dL   Alkaline Phosphatase 61 39 - 117 IU/L   AST 18 0 - 40 IU/L   ALT 24 0 - 32 IU/L  POCT urinalysis dipstick  Result Value Ref Range   Color, UA yellow yellow   Clarity, UA clear clear   Glucose, UA negative negative mg/dL   Bilirubin, UA negative negative   Ketones, POC UA negative negative mg/dL   Spec Grav, UA 7.829 5.621 - 1.025   Blood, UA trace-intact (A) negative   pH, UA 6.0 5.0 - 8.0   Protein Ur, POC negative negative mg/dL   Urobilinogen, UA 0.2 0.2 or 1.0 E.U./dL   Nitrite, UA Negative Negative   Leukocytes, UA Negative Negative    Labs Reviewed - No data to display  No results found. Abdominal CT from 10/28/17 CLINICAL DATA:  Acute abdominal pain.  EXAM: CT ABDOMEN AND PELVIS WITH CONTRAST  TECHNIQUE: Multidetector CT imaging of the abdomen and pelvis was performed using the standard protocol following bolus administration of intravenous contrast.  CONTRAST:  100 cc Isovue-300 IV  COMPARISON:  None.  FINDINGS: Lower chest: The lung bases are clear.  Hepatobiliary: Decreased hepatic density consistent with steatosis. No focal lesion. Mild gallbladder distention without calcified stone or pericholecystic inflammation. Common  bile duct is dilated measuring 10 mm. No visualized choledocholithiasis.  Pancreas: No ductal dilatation or inflammation.  Spleen: Normal in size without focal abnormality.  Adrenals/Urinary Tract: Normal adrenal  glands. Bilateral hydroureteronephrosis. Heterogeneous enhancement of both kidneys. There are nonobstructing stones within both kidneys. No ureteral calculi. No perirenal or intrarenal fluid collection. Urinary bladder is distended. No bladder wall thickening. No bladder stone.  Stomach/Bowel: Stomach is within normal limits. Appendix appears normal. No evidence of bowel wall thickening, distention, or inflammatory changes.  Vascular/Lymphatic: No significant vascular findings are present. No enlarged abdominal or pelvic lymph nodes.  Reproductive: IUD appropriately positioned in the uterus. No adnexal mass.  Other: No free air, free fluid, or intra-abdominal fluid collection.  Musculoskeletal: There are no acute or suspicious osseous abnormalities.  IMPRESSION: 1. Gallbladder and biliary dilatation without calcified gallstone or choledocholithiasis. Consider right upper quadrant ultrasound and possible MRCP for further evaluation. 2. Bilateral hydroureteronephrosis. Urinary bladder distention. Nonobstructing stones in both kidneys without ureteral stone. Recommend correlation for urinary retention or urinary tract infection.   Electronically Signed   By: Melanie  Ehinger M.D.   On: 10/28/2017 02:36  Abdominal U/S froRubye Oaksm 10/28/17 FINDINGS: Gallbladder:  Multiple mobile stones present within the gallbladder lumen, largest of which measures 3 cm. Gallbladder wall measure within normal limits at 2.8 mm. No free pericholecystic fluid. No sonographic Murphy sign elicited on exam.  Common bile duct:  Diameter: 6.4 mm, upper limits of normal  Liver:  No focal lesion identified. Within normal limits in parenchymal echogenicity. Portal vein is  patent on color Doppler imaging with normal direction of blood flow towards the liver.  IMPRESSION: 1. Cholelithiasis with no sonographic features to suggest acute cholecystitis. 2. Common bile duct measures at the upper limits of normal at approximately 6 mm. No choledocholithiasis.   Electronically Signed   By: Rise MuBenjamin  McClintock M.D.   On: 10/28/2017 04:25    ASSESSMENT & PLAN:  1. Urinary retention    [facility] No orders of the defined types were placed in this encounter.   Reviewed expectations re: course of current medical issues. Questions answered. Outlined signs and symptoms indicating need for more acute intervention. Patient verbalized understanding. After Visit Summary given.     Elvina SidleLauenstein, Heavyn Yearsley, MD 11/08/17 1306    Elvina SidleLauenstein, Vonna Brabson, MD 11/08/17 1309

## 2017-11-08 NOTE — ED Provider Notes (Signed)
[facility] Provider Note   CSN: [id number] Arrival date & time: 11/08/17  1502     History   Chief Complaint Chief Complaint  Patient presents with  . Abdominal Pain  . Back Pain  . Headache    HPI Sharon Johnson is a 42 y.o. female.  HPI   Presents with concern for abdominal pain Reports having vitamin infusion, abdominal pain, back pain, nausea and vomiting 4/11 and was admitted.  Was told she had kidney inflammation and GB stones.  Had bladder catheter placed.  Reports since the 11th, symptoms have been gradually getting worse.  Now having fever and more lower pain, bilateral flank with radiation to the suprapubic area.  The pain is always there. Has not been able to sleep for 2 nights because of pain.  Nausea, no vomiting.  No dysuria.  No urinary frequency. Sometimes will urinate will come and go and come back.  No diarrhea, has had constipation.  Pain not worsened by eating.  Eating less now.  Fever started last night.   LMP was 3/20  Past Medical History:  Diagnosis Date  . Hypertension   . Migraine     Patient Active Problem List   Diagnosis Date Noted  . SIRS (systemic inflammatory response syndrome) (HCC) 11/08/2017  . Calculus of gallbladder without cholecystitis without obstruction 11/05/2017  . Fatty liver 11/05/2017  . Nausea and vomiting 10/28/2017  . Lower abdominal pain 10/28/2017  . Transaminitis 10/28/2017  . Hyperbilirubinemia 10/28/2017  . Leukocytosis 10/28/2017    History reviewed. No pertinent surgical history.   OB History    Gravida  1   Para      Term      Preterm      AB      Living        SAB      TAB      Ectopic      Multiple      Live Births               Home Medications    Prior to Admission medications   Medication Sig Start Date End Date Taking? Authorizing Provider  ibuprofen (ADVIL,MOTRIN) 800 MG tablet Take 1 tablet (800 mg total) by mouth every 8 (eight) hours as needed  (for pain.). 11/05/17  Yes Myles Lipps, MD  levonorgestrel (MIRENA) 20 MCG/24HR IUD 1 each by Intrauterine route once.   Yes [provider]  Multiple Vitamins-Minerals (MULTIVITAMIN GUMMIES ADULT) CHEW Chew 1 tablet by mouth daily.   Yes [provider]  tamsulosin (FLOMAX) 0.4 MG CAPS capsule Take 1 capsule (0.4 mg total) by mouth daily. 11/05/17  Yes Myles Lipps, MD    Family History Family History  Problem Relation Age of Onset  . Ovarian cancer Mother     Social History Social History   Tobacco Use  . Smoking status: Never Smoker  . Smokeless tobacco: Never Used  Substance Use Topics  . Alcohol use: No    Alcohol/week: 0.0 oz  . Drug use: No     Allergies   Patient has no known allergies.   Review of Systems Review of Systems  Constitutional: Positive for fever.  HENT: Negative for sore throat.   Eyes: Negative for visual disturbance.  Respiratory: Negative for cough and shortness of breath.   Cardiovascular: Negative for chest pain.  Gastrointestinal: Positive for abdominal pain, constipation and nausea.  Genitourinary: Negative for difficulty urinating, dysuria, frequency, vaginal bleeding  and vaginal discharge.  Musculoskeletal: Negative for back pain and neck pain.  Skin: Negative for rash.  Neurological: Negative for syncope and headaches.     Physical Exam Updated Vital Signs BP 121/69 (BP Location: Right Arm)   Pulse (!) 111   Temp (!) 100.5 F (38.1 C) (Oral)   Resp 18   SpO2 96%   Physical Exam  Constitutional: She is oriented to person, place, and time. She appears well-developed and well-nourished. No distress.  HENT:  Head: Normocephalic and atraumatic.  Eyes: Conjunctivae and EOM are normal.  Neck: Normal range of motion.  Cardiovascular: Normal rate, regular rhythm, normal heart sounds and intact distal pulses. Exam reveals no gallop and no friction rub.  No murmur heard. Pulmonary/Chest: Effort normal and  breath sounds normal. No respiratory distress. She has no wheezes. She has no rales.  Abdominal: Soft. She exhibits no distension. There is tenderness (suprapubic) in the suprapubic area. There is CVA tenderness (bilateral). There is no guarding.  Musculoskeletal: She exhibits no edema or tenderness.  Neurological: She is alert and oriented to person, place, and time.  Skin: Skin is warm and dry. No rash noted. She is not diaphoretic. No erythema.  Nursing note and vitals reviewed.    ED Treatments / Results  Labs (all labs ordered are listed, but only abnormal results are displayed) Labs Reviewed  COMPREHENSIVE METABOLIC PANEL - Abnormal; Notable for the following components:      Result Value   CO2 21 (*)    Glucose, Bld 118 (*)    Creatinine, Ser 0.36 (*)    All other components within normal limits  CBC - Abnormal; Notable for the following components:   WBC 12.9 (*)    All other components within normal limits  URINALYSIS, ROUTINE W REFLEX MICROSCOPIC - Abnormal; Notable for the following components:   Color, Urine STRAW (*)    Specific Gravity, Urine 1.002 (*)    Hgb urine dipstick SMALL (*)    Bacteria, UA RARE (*)    Squamous Epithelial / LPF 0-5 (*)    All other components within normal limits  I-STAT BETA HCG BLOOD, ED ([facility], [facility], [facility] ONLY) - Abnormal; Notable for the following components:   I-stat hCG, quantitative 5.5 (*)    All other components within normal limits  URINE CULTURE  CULTURE, BLOOD (ROUTINE X 2)  CULTURE, BLOOD (ROUTINE X 2)  LIPASE, BLOOD  HCG, QUANTITATIVE, PREGNANCY  SEDIMENTATION RATE  LACTIC ACID, PLASMA  LACTIC ACID, PLASMA  PROCALCITONIN  MAGNESIUM  PHOSPHORUS  TSH  COMPREHENSIVE METABOLIC PANEL  CBC  HEMOGLOBIN A1C  I-STAT CG4 LACTIC ACID, ED    EKG None  Radiology Dg Chest 2 View  Result Date: 11/08/2017 CLINICAL DATA:  Continued back and abdominal pain with fever. Pain is worsened since yesterday. EXAM: CHEST - 2 VIEW  COMPARISON:  CT abdomen and pelvis from earlier on the same day. FINDINGS: The heart size and mediastinal contours are within normal limits. Both lungs are clear. Mild eventration of the right hemidiaphragm. Atelectasis posteriorly at the lung bases as seen on the same day CT likely accounts for the slightly irregular appearance of the posterior costophrenic angle. No pneumothorax or effusion. No acute osseous appearing abnormality. Degenerative changes are noted along the midthoracic spine. IMPRESSION: No active cardiopulmonary disease. Electronically Signed   By: Tollie Eth M.D.   On: 11/08/2017 23:39   Ct Abdomen Pelvis W Contrast  Result Date: 11/08/2017 CLINICAL DATA:  Lower abdominal and back  pain for the past 10 days. EXAM: CT ABDOMEN AND PELVIS WITH CONTRAST TECHNIQUE: Multidetector CT imaging of the abdomen and pelvis was performed using the standard protocol following bolus administration of intravenous contrast. CONTRAST:  100mL ISOVUE-300 IOPAMIDOL (ISOVUE-300) INJECTION 61% COMPARISON:  CT abdomen pelvis dated October 28, 2017. FINDINGS: Lower chest: No acute abnormality. Hepatobiliary: No focal liver abnormality. Persistent gallbladder distention and mild common bile duct dilatation to 9 mm, similar to prior study. No gallstones or gallbladder wall thickening. Pancreas: Unremarkable. No pancreatic ductal dilatation or surrounding inflammatory changes. Spleen: Normal in size without focal abnormality. Adrenals/Urinary Tract: The adrenal glands are unremarkable. Resolved bilateral hydroureteronephrosis. Bilateral nonobstructing renal calculi again noted. No ureteral calculi. The bladder is unremarkable. Stomach/Bowel: Stomach is within normal limits. Appendix appears normal. No evidence of bowel wall thickening, distention, or inflammatory changes. Vascular/Lymphatic: No significant vascular findings are present. No enlarged abdominal or pelvic lymph nodes. Reproductive: IUD appropriately positioned  within the uterus. No adnexal mass. Other: No free fluid or pneumoperitoneum. Musculoskeletal: No acute or significant osseous findings. IMPRESSION: 1. Unchanged gallbladder distention and mild common bile duct dilatation without CT evidence of cholelithiasis or choledocholithiasis. 2. Resolved bilateral hydroureteronephrosis. 3. Unchanged bilateral nonobstructive nephrolithiasis. Electronically Signed   By: Obie DredgeWilliam T Derry M.D.   On: 11/08/2017 21:43    Procedures .Critical Care Performed by: Alvira MondaySchlossman, Kaloni Bisaillon, MD Authorized by: Alvira MondaySchlossman, Shayma Pfefferle, MD   Comments:     CRITICAL CARE: sepsis, multiple abx, fluid boluses, consults Performed by: Lynnea FerrierErin E Diron Haddon   Total critical care time: 30 minutes  Critical care time was exclusive of separately billable procedures and treating other patients.  Critical care was necessary to treat or prevent imminent or life-threatening deterioration.  Critical care was time spent personally by me on the following activities: development of treatment plan with patient and/or surrogate as well as nursing, discussions with consultants, evaluation of patient's response to treatment, examination of patient, obtaining history from patient or surrogate, ordering and performing treatments and interventions, ordering and review of laboratory studies, ordering and review of radiographic studies, pulse oximetry and re-evaluation of patient's condition.    (including critical care time)  Medications Ordered in ED Medications  tamsulosin (FLOMAX) capsule 0.4 mg (has no administration in time range)  acetaminophen (TYLENOL) tablet 650 mg ( Oral See Alternative 11/09/17 0014)    Or  acetaminophen (TYLENOL) suppository 650 mg (650 mg Rectal Given 11/09/17 0014)  HYDROcodone-acetaminophen (NORCO/VICODIN) 5-325 MG per tablet 1-2 tablet (has no administration in time range)  ondansetron (ZOFRAN) tablet 4 mg (has no administration in time range)    Or  ondansetron (ZOFRAN)  injection 4 mg (has no administration in time range)  0.9 %  sodium chloride infusion (has no administration in time range)  piperacillin-tazobactam (ZOSYN) IVPB 3.375 g (has no administration in time range)  piperacillin-tazobactam (ZOSYN) IVPB 3.375 g (has no administration in time range)  acetaminophen (TYLENOL) tablet 650 mg (650 mg Oral Given 11/08/17 1520)  cefTRIAXone (ROCEPHIN) 2 g in sodium chloride 0.9 % 100 mL IVPB (0 g Intravenous Stopped 11/08/17 2104)  sodium chloride 0.9 % bolus 1,000 mL (0 mLs Intravenous Stopped 11/08/17 2212)  ondansetron (ZOFRAN) injection 4 mg (4 mg Intravenous Given 11/08/17 1956)  morphine 4 MG/ML injection 4 mg (4 mg Intravenous Given 11/08/17 1956)  sodium chloride 0.9 % bolus 1,000 mL (0 mLs Intravenous Stopped 11/08/17 2107)  sodium chloride 0.9 % bolus 500 mL (0 mLs Intravenous Stopped 11/08/17 2327)  iopamidol (ISOVUE-300) 61 % injection 100  mL (100 mLs Intravenous Contrast Given 11/08/17 2112)  morphine 4 MG/ML injection 4 mg (4 mg Intravenous Given 11/08/17 2125)  metroNIDAZOLE (FLAGYL) IVPB 500 mg (0 mg Intravenous Stopped 11/08/17 2307)  morphine 4 MG/ML injection 4 mg (4 mg Intravenous Given 11/08/17 2256)     Initial Impression / Assessment and Plan / ED Course  I have reviewed the triage vital signs and the nursing notes.  Pertinent labs & imaging results that were available during my care of the patient were reviewed by me and considered in my medical decision making (see chart for details).     42 year old female with a history of hypertension, recent admission April 11 with concern at that time for cholelithiasis, dilated common bile duct, transaminitis and elevated lipase, presents with concern for continued abdominal pain since that time with associated nausea, and fevers developing last night.  Patient febrile to 102 on arrival to the emergency department and tachycardic.  Her labs show improvement from recent admission, with no continuing  hyperbilirubinemia, transaminitis or elevation in lipase.  Her abdominal exam overall is not consistent with cholecystitis, cholelithiasis, choledocholithiasis.  Urinalysis shows no sign of urinary tract infection.  Patient denies any pelvic symptoms, including no vaginal discharge, low suspicion for TOA or PID.  Pregnancy test quantitative negative.   CT abdomen pelvis repeated, without significant changes from prior.  Prior hydronephrosis and bladder distention that was noted has improved.  Patient continues to have dilation of the common bile duct to 9 mm and gallbladder distention without signs of stones.  While patient with atypical presentation, discussed with [facility] gastroenterology given CBD dilation, fever, pain and discussed with Dr. Johna Sheriff given prior cholelithiasis with prior transaminitis.  Unclear etiology at this time of fever, leukocytosis, and abdominal pain. Given rocephin and flagyl, normal saline and will admit for further care.    Final Clinical Impressions(s) / ED Diagnoses   Final diagnoses:  Generalized abdominal pain  Nausea without vomiting    ED Discharge Orders    None       Alvira Monday, MD 11/09/17 [phone]

## 2017-11-08 NOTE — ED Triage Notes (Signed)
Pt c/o of continued back and abd pains since [facility] but now having headaches. [facility] come to ED.

## 2017-11-08 NOTE — Consult Note (Addendum)
Reason for Consult: Abdominal pain, gallstones   Referring Physician: [name] is an 42 y.o. female.  HPI: Patient is a generally healthy 42 year old Hispanic female who 12 days ago developed the acute onset of abdominal pain associated with nausea and vomiting.  The pain is described as aching pain across her lower abdomen on both sides and radiating through to her lower back.  Patient was evaluated in the ED and was admitted overnight.  At that time CT scan showed a mildly dilated CBD at 9 mm well as mild bilateral hydroureteronephrosis.  Ultrasound showed gallstones.  She also had moderately elevated LFTs at that time with a bilirubin of 4 and moderately elevated transaminases.  She had a transiently elevated lipase of about 80 the return to normal.  Pain was felt to be atypical for gallbladder disease.  She was observed for 24 hours and the symptoms seemed to improve.  However the pain has apparently been persistent over the last 12 days fairly constant with waxing and waning but overall getting worse.  She began having some fever yesterday.  Has been nauseated without further vomiting.  Some slight burning apparently with urination.  Bowel movements have been normal without diarrhea or constipation or blood.  She was seen at an urgent care today and referred back to the emergency department. No history of any previous similar episodes were chronic GI complaints.  Past Medical History:  Diagnosis Date  . Hypertension   . Migraine     History reviewed. No pertinent surgical history.  Family History  Problem Relation Age of Onset  . Ovarian cancer Mother     Social History:  reports that she has never smoked. She has never used smokeless tobacco. She reports that she does not drink alcohol or use drugs.  Allergies: No Known Allergies  No current facility-administered medications for this encounter.    Current Outpatient Medications  Medication Sig Dispense Refill   . ibuprofen (ADVIL,MOTRIN) 800 MG tablet Take 1 tablet (800 mg total) by mouth every 8 (eight) hours as needed (for pain.). 90 tablet 0  . levonorgestrel (MIRENA) 20 MCG/24HR IUD 1 each by Intrauterine route once.    . Multiple Vitamins-Minerals (MULTIVITAMIN GUMMIES ADULT) CHEW Chew 1 tablet by mouth daily.    . tamsulosin (FLOMAX) 0.4 MG CAPS capsule Take 1 capsule (0.4 mg total) by mouth daily. 30 capsule 0     Results for orders placed or performed during the hospital encounter of 11/08/17 (from the past 48 hour(s))  Lipase, blood     Status: None   Collection Time: 11/08/17  4:30 PM  Result Value Ref Range   Lipase 29 11 - 51 U/L    Comment: Performed at [facility], [place] [address] [place], [place] [address]  Comprehensive metabolic panel     Status: Abnormal   Collection Time: 11/08/17  4:30 PM  Result Value Ref Range   Sodium 135 135 - 145 mmol/L   Potassium 3.6 3.5 - 5.1 mmol/L   Chloride 103 101 - 111 mmol/L   CO2 21 (L) 22 - 32 mmol/L   Glucose, Bld 118 (H) 65 - 99 mg/dL   BUN 7 6 - 20 mg/dL   Creatinine, Ser 0.36 (L) 0.44 - 1.00 mg/dL   Calcium 9.2 8.9 - 10.3 mg/dL   Total Protein 7.7 6.5 - 8.1 g/dL   Albumin 4.4 3.5 - 5.0 g/dL   AST 18 15 - 41 U/L   ALT 26  14 - 54 U/L   Alkaline Phosphatase 59 38 - 126 U/L   Total Bilirubin 0.5 0.3 - 1.2 mg/dL   GFR calc non Af Amer >60 >60 mL/min   GFR calc Af Amer >60 >60 mL/min    Comment: (NOTE) The eGFR has been calculated using the CKD EPI equation. This calculation has not been validated in all clinical situations. eGFR's persistently <60 mL/min signify possible Chronic Kidney Disease.    Anion gap 11 5 - 15    Comment: Performed at [facility], [place] [address] [place], [place] [address]  CBC     Status: Abnormal   Collection Time: 11/08/17  4:30 PM  Result Value Ref Range   WBC 12.9 (H) 4.0 - 10.5 K/uL   RBC 4.62 3.87 - 5.11 MIL/uL   Hemoglobin 13.9 12.0 - 15.0 g/dL   HCT  41.0 36.0 - 46.0 %   MCV 88.7 78.0 - 100.0 fL   MCH 30.1 26.0 - 34.0 pg   MCHC 33.9 30.0 - 36.0 g/dL   RDW 13.7 11.5 - 15.5 %   Platelets 273 150 - 400 K/uL    Comment: Performed at [facility], [place] [address], [place], [place] [address]  Urinalysis, Routine w reflex microscopic     Status: Abnormal   Collection Time: 11/08/17  4:30 PM  Result Value Ref Range   Color, Urine STRAW (A) YELLOW   APPearance CLEAR CLEAR   Specific Gravity, Urine 1.002 (L) 1.005 - 1.030   pH 7.0 5.0 - 8.0   Glucose, UA NEGATIVE NEGATIVE mg/dL   Hgb urine dipstick SMALL (A) NEGATIVE   Bilirubin Urine NEGATIVE NEGATIVE   Ketones, ur NEGATIVE NEGATIVE mg/dL   Protein, ur NEGATIVE NEGATIVE mg/dL   Nitrite NEGATIVE NEGATIVE   Leukocytes, UA NEGATIVE NEGATIVE   RBC / HPF 0-5 0 - 5 RBC/hpf   WBC, UA 0-5 0 - 5 WBC/hpf   Bacteria, UA RARE (A) NONE SEEN   Squamous Epithelial / LPF 0-5 (A) NONE SEEN    Comment: Performed at [facility], [place] [address] [place], [place] [address]  I-Stat beta hCG blood, ED     Status: Abnormal   Collection Time: 11/08/17  4:36 PM  Result Value Ref Range   I-stat hCG, quantitative 5.5 (H) <5 mIU/mL   Comment 3            Comment:   GEST. AGE      CONC.  (mIU/mL)   <=1 WEEK        5 - 50     2 WEEKS       50 - 500     3 WEEKS       100 - 10,000     4 WEEKS     1,000 - 30,000        FEMALE AND NON-PREGNANT FEMALE:     LESS THAN 5 mIU/mL   I-Stat CG4 Lactic Acid, ED     Status: None   Collection Time: 11/08/17  4:38 PM  Result Value Ref Range   Lactic Acid, Venous 1.41 0.5 - 1.9 mmol/L  Urine culture     Status: None (Preliminary result)   Collection Time: 11/08/17  6:54 PM  Result Value Ref Range   Specimen Description      URINE, CLEAN CATCH Performed at [facility], [place] [address] [place], [place] [address]    Special Requests  NONE Performed at [facility], [place] [address] [place], [place]  [address]    Culture PENDING    Report Status PENDING   hCG, quantitative, pregnancy     Status: None   Collection Time: 11/08/17  7:10 PM  Result Value Ref Range   hCG, Beta Chain, Quant, S <1 <5 mIU/mL    Comment:          GEST. AGE      CONC.  (mIU/mL)   <=1 WEEK        5 - 50     2 WEEKS       50 - 500     3 WEEKS       100 - 10,000     4 WEEKS     1,000 - 30,000     5 WEEKS     3,500 - 115,000   6-8 WEEKS     12,000 - 270,000    12 WEEKS     15,000 - 220,000        FEMALE AND NON-PREGNANT FEMALE:     LESS THAN 5 mIU/mL Performed at [facility], [place] [address], [place], [place] [address]   Blood culture (routine x 2)     Status: None (Preliminary result)   Collection Time: 11/08/17  7:29 PM  Result Value Ref Range   Specimen Description      BLOOD LEFT HAND Performed at [facility], [place] [address] [place], [place] [address]    Special Requests      BOTTLES DRAWN AEROBIC AND ANAEROBIC Blood Culture results may not be optimal due to an inadequate volume of blood received in culture bottles Performed at [facility], [place] [address], [place], [place] [address]    Culture PENDING    Report Status PENDING     Ct Abdomen Pelvis W Contrast  Result Date: 11/08/2017 CLINICAL DATA:  Lower abdominal and back pain for the past 10 days. EXAM: CT ABDOMEN AND PELVIS WITH CONTRAST TECHNIQUE: Multidetector CT imaging of the abdomen and pelvis was performed using the standard protocol following bolus administration of intravenous contrast. CONTRAST:  129m ISOVUE-300 IOPAMIDOL (ISOVUE-300) INJECTION 61% COMPARISON:  CT abdomen pelvis dated October 28, 2017. FINDINGS: Lower chest: No acute abnormality. Hepatobiliary: No focal liver abnormality. Persistent gallbladder distention and mild common bile duct dilatation to 9 mm, similar to prior study. No gallstones or gallbladder wall thickening. Pancreas: Unremarkable. No pancreatic ductal dilatation or  surrounding inflammatory changes. Spleen: Normal in size without focal abnormality. Adrenals/Urinary Tract: The adrenal glands are unremarkable. Resolved bilateral hydroureteronephrosis. Bilateral nonobstructing renal calculi again noted. No ureteral calculi. The bladder is unremarkable. Stomach/Bowel: Stomach is within normal limits. Appendix appears normal. No evidence of bowel wall thickening, distention, or inflammatory changes. Vascular/Lymphatic: No significant vascular findings are present. No enlarged abdominal or pelvic lymph nodes. Reproductive: IUD appropriately positioned within the uterus. No adnexal mass. Other: No free fluid or pneumoperitoneum. Musculoskeletal: No acute or significant osseous findings. IMPRESSION: 1. Unchanged gallbladder distention and mild common bile duct dilatation without CT evidence of cholelithiasis or choledocholithiasis. 2. Resolved bilateral hydroureteronephrosis. 3. Unchanged bilateral nonobstructive nephrolithiasis. Electronically Signed   By: WTitus DubinM.D.   On: 11/08/2017 21:43    Review of Systems  Constitutional: Positive for fever. Negative for chills.  Respiratory: Negative.   Cardiovascular: Negative.   Gastrointestinal: Positive for abdominal pain, nausea and vomiting. Negative for blood in stool, constipation and diarrhea.  Genitourinary: Positive for dysuria.  Musculoskeletal: Positive for back pain.   Blood pressure (!) 123/102, pulse (!) 115, temperature (!) 100.4 F (38 C), temperature source Oral, resp. rate 20, SpO2 94 %, unknown if currently breastfeeding. Physical Exam General: Alert, moderately obese Hispanic female, in no distress Skin: Warm and dry without rash or infection. HEENT: No palpable masses or thyromegaly. Sclera nonicteric. Pupils equal round and reactive.  Lymph nodes: No cervical, supraclavicular,  nodes palpable. Lungs: Breath sounds clear and equal without increased work of breathing Cardiovascular: Regular  tachycardia without murmur. No JVD or edema.  Abdomen: Nondistended.  Bowel sounds hypoactive.  Mild to moderate tenderness across the lower abdomen without significant guarding and very minimal upper abdominal tenderness.. No masses palpable. No organomegaly. No palpable hernias. Extremities: No edema or joint swelling or deformity. No chronic venous stasis changes. Neurologic: Alert and fully oriented.  Affect normal.  No gross motor deficits.   Assessment/Plan: Generally healthy 42 year old Hispanic female with abdominal pain that presented acutely 12 days ago.  At that time she had moderately elevated LFTs and mildly elevated lipase that resolved quickly.  CT scan remarkable only for slightly distended gallbladder and CBD and mild bilateral hydroureteronephrosis.  Urine has been clear.  She has had persistent pain.  Ultrasound has shown gallstones.  Now has fever. Other than the location of her pain and tenderness this would fit with a passed common bile duct stone and possible acute cholecystitis.  However her pain is really in her lower abdomen and she is not very tender at all in her upper abdomen.  She has some mild urinary symptoms and findings above on CT.  Urinary infection however would really not explain her elevated LFTs unless this was a result of sepsis.  Patient is currently being admitted by the hospitalist.  Urine will be recultured.  We will obtain an HIDA scan to check for cystic duct obstruction.  Patient will be covered with broad-spectrum antibiotics to cover GI and urinary sources.  Sharon Johnson 11/08/2017, 11:31 PM

## 2017-11-08 NOTE — H&P (Signed)
Sharon Johnson ZOX:[id number] DOB: [birth year] DOA: 11/08/2017     PCP: Patient, No Pcp Per   Outpatient Specialists: NONE  Patient arrived to ER on 11/08/17 at 1502  Patient coming from: home   Chief Complaint:  Chief Complaint  Patient presents with  . Abdominal Pain  . Back Pain  . Headache    HPI: Sharon Johnson is a 42 y.o. female with medical history significant of HTN, migraine       Presented with   abdominal pain back pain and fever associated some dysuria. Today fever was up to 102. Pain in her flanks and suprapubic.  Patient had  recent vitamin infusion had [facility] on 10 4 April afterwards she developed sharp abdominal pain and bilateral flank pain associated with  nausea.  Was later found out that she had an infusion of the magnesium vitamin C vitamin B12 and B6 as well as fish oil supplement.  Other family members have had similar abdominal pain. initia  CT of abdomen was done showed no evidence of pancreatitis showed gallstones but no cholecystitis. RUQ [place] no cholecystitis. Common bile duct upper limits of normal at 6 mm No choledocholithiasis.  She was admitted and discharged on 11 April Was noted to have urinary retention started on Flomax Imaging showed bilateral hydroureter nephrosis and urinary bladder distention now resolved  While in ER:  LFT seemed to improve  Following Medications were ordered in ER: Medications  metroNIDAZOLE (FLAGYL) IVPB 500 mg (500 mg Intravenous New Bag/Given 11/08/17 2207)  morphine 4 MG/ML injection 4 mg (has no administration in time range)  acetaminophen (TYLENOL) tablet 650 mg (650 mg Oral Given 11/08/17 1520)  cefTRIAXone (ROCEPHIN) 2 g in sodium chloride 0.9 % 100 mL IVPB (0 g Intravenous Stopped 11/08/17 2104)  sodium chloride 0.9 % bolus 1,000 mL (0 mLs Intravenous Stopped 11/08/17 2212)  ondansetron (ZOFRAN) injection 4 mg (4 mg Intravenous Given 11/08/17 1956)  morphine 4 MG/ML  injection 4 mg (4 mg Intravenous Given 11/08/17 1956)  sodium chloride 0.9 % bolus 1,000 mL (0 mLs Intravenous Stopped 11/08/17 2107)  sodium chloride 0.9 % bolus 500 mL (500 mLs Intravenous New Bag/Given 11/08/17 2212)  iopamidol (ISOVUE-300) 61 % injection 100 mL (100 mLs Intravenous Contrast Given 11/08/17 2112)  morphine 4 MG/ML injection 4 mg (4 mg Intravenous Given 11/08/17 2125)    Significant initial  Findings: Abnormal Labs Reviewed  COMPREHENSIVE METABOLIC PANEL - Abnormal; Notable for the following components:      Result Value   CO2 21 (*)    Glucose, Bld 118 (*)    Creatinine, Ser 0.36 (*)    All other components within normal limits  CBC - Abnormal; Notable for the following components:   WBC 12.9 (*)    All other components within normal limits  URINALYSIS, ROUTINE W REFLEX MICROSCOPIC - Abnormal; Notable for the following components:   Color, Urine STRAW (*)    Specific Gravity, Urine 1.002 (*)    Hgb urine dipstick SMALL (*)    Bacteria, UA RARE (*)    Squamous Epithelial / LPF 0-5 (*)    All other components within normal limits  I-STAT BETA HCG BLOOD, ED ([facility], [facility], [facility] ONLY) - Abnormal; Notable for the following components:   I-stat hCG, quantitative 5.5 (*)    All other components within normal limits     Na 135 K 3.6  Cr down from baseline see below Lab Results  Component Value Date  CREATININE 0.36 (L) 11/08/2017   CREATININE 0.55 (L) 11/05/2017   CREATININE 0.50 10/28/2017      HG/HCT stable,     Component Value Date/Time   HGB 13.9 11/08/2017 1630   HGB 13.2 11/05/2017 1129   HCT 41.0 11/08/2017 1630   HCT 39.6 11/05/2017 1129      Lactic Acid, Venous    Component Value Date/Time   LATICACIDVEN 1.41 11/08/2017 1638      UA  no evidence of UTI      CXR -  NON acute  CTabd/pelvis -improved hydronephrosis unchanged  cholelithiasis  ECG:  Not obtained     ED Triage Vitals [11/08/17 1515]  Enc Vitals Group     BP (!) 149/91      Pulse Rate (!) 106     Resp 19     Temp (!) 102.1 F (38.9 C)     Temp Source Oral     SpO2 99 %     Weight      Height      Head Circumference      Peak Flow      Pain Score 10     Pain Loc      Pain Edu?      Excl. in GC?   ZOXW(96)@TMAX(24)@       Latest  Blood pressure 120/73, pulse (!) 117, temperature (!) 100.4 F (38 C), temperature source Oral, resp. rate 20, SpO2 98 %, unknown if currently breastfeeding.     ER Provider Called:    [facility]  GI Dr.Karki Will see in AM    General surgery Dr.Hoxworth Will see in AM   Hospitalist was called for admission for fever and  abdominal pain   Review of Systems:    Pertinent positives include:  Fevers, chills, fatigue,abdominal pain,  Flank pain  Constitutional:  No weight loss, night sweats weight loss  HEENT:  No headaches, Difficulty swallowing,Tooth/dental problems,Sore throat,  No sneezing, itching, ear ache, nasal congestion, post nasal drip,  Cardio-vascular:  No chest pain, Orthopnea, PND, anasarca, dizziness, palpitations.no Bilateral lower extremity swelling  GI:  No heartburn, indigestion, nausea, vomiting, diarrhea, change in bowel habits, loss of appetite, melena, blood in stool, hematemesis Resp:  no shortness of breath at rest. No dyspnea on exertion, No excess mucus, no productive cough, No non-productive cough, No coughing up of blood.No change in color of mucus. No wheezing. Skin:  no rash or lesions. No jaundice GU:  no dysuria, change in color of urine, no urgency or frequency. No straining to urinate.  No flank pain.  Musculoskeletal:  No joint pain or no joint swelling. No decreased range of motion. No back pain.  Psych:  No change in mood or affect. No depression or anxiety. No memory loss.  Neuro: no localizing neurological complaints, no tingling, no weakness, no double vision, no gait abnormality, no slurred speech, no confusion  As per HPI otherwise 10 point review of systems negative.   Past  Medical History:   Past Medical History:  Diagnosis Date  . Hypertension   . Migraine       History reviewed. No pertinent surgical history.  Social History:  Ambulatory   Independently      reports that she has never smoked. She has never used smokeless tobacco. She reports that she does not drink alcohol or use drugs.     Family History:   Family History  Problem Relation Age of Onset  . Ovarian cancer Mother  Allergies: No Known Allergies   Prior to Admission medications   Medication Sig Start Date End Date Taking? Authorizing Provider  ibuprofen (ADVIL,MOTRIN) 800 MG tablet Take 1 tablet (800 mg total) by mouth every 8 (eight) hours as needed (for pain.). 11/05/17  Yes Myles Lipps, MD  levonorgestrel (MIRENA) 20 MCG/24HR IUD 1 each by Intrauterine route once.   Yes [provider]  Multiple Vitamins-Minerals (MULTIVITAMIN GUMMIES ADULT) CHEW Chew 1 tablet by mouth daily.   Yes [provider]  tamsulosin (FLOMAX) 0.4 MG CAPS capsule Take 1 capsule (0.4 mg total) by mouth daily. 11/05/17  Yes Myles Lipps, MD   Physical Exam: Blood pressure 120/73, pulse (!) 117, temperature (!) 100.4 F (38 C), temperature source Oral, resp. rate 20, SpO2 98 %, unknown if currently breastfeeding. 1. General:  in No Acute distress  well  -appearing 2. Psychological: Alert and  Oriented 3. Head/ENT:    Dry Mucous Membranes                          Head Non traumatic, neck supple                          Normal   Dentition 4. SKIN:  decreased Skin turgor,  Skin clean Dry and intact no rash 5. Heart: Regular rate and rhythm no Murmur, no Rub or gallop 6. Lungs:  no wheezes or crackles   7. Abdomen: Soft, suprapubic tenderness, Non distended  bowel sounds present 8. Lower extremities: no clubbing, cyanosis, or edema 9. Neurologically Grossly intact, moving all 4 extremities equally  10. MSK: Normal range of motion Bilateral flank tenderness  LABS:      Recent Labs  Lab 11/05/17 1129 11/08/17 1630  WBC 6.5 12.9*  NEUTROABS 2.5  --   HGB 13.2 13.9  HCT 39.6 41.0  MCV 89 88.7  PLT 358 273   Basic Metabolic Panel: Recent Labs  Lab 11/05/17 1129 11/08/17 1630  NA 141 135  K 4.5 3.6  CL 103 103  CO2 20 21*  GLUCOSE 86 118*  BUN 8 7  CREATININE 0.55* 0.36*  CALCIUM 9.8 9.2      Recent Labs  Lab 11/05/17 1129 11/08/17 1630  AST 18 18  ALT 24 26  ALKPHOS 61 59  BILITOT 0.6 0.5  PROT 7.4 7.7  ALBUMIN 4.8 4.4   Recent Labs  Lab 11/08/17 1630  LIPASE 29   No results for input(s): AMMONIA in the last 168 hours.    HbA1C: No results for input(s): HGBA1C in the last 72 hours. CBG: No results for input(s): GLUCAP in the last 168 hours.    Urine analysis:    Component Value Date/Time   COLORURINE STRAW (A) 11/08/2017 1630   APPEARANCEUR CLEAR 11/08/2017 1630   LABSPEC 1.002 (L) 11/08/2017 1630   PHURINE 7.0 11/08/2017 1630   GLUCOSEU NEGATIVE 11/08/2017 1630   HGBUR SMALL (A) 11/08/2017 1630   BILIRUBINUR NEGATIVE 11/08/2017 1630   BILIRUBINUR negative 11/05/2017 1032   KETONESUR NEGATIVE 11/08/2017 1630   PROTEINUR NEGATIVE 11/08/2017 1630   UROBILINOGEN 0.2 11/05/2017 1032   NITRITE NEGATIVE 11/08/2017 1630   LEUKOCYTESUR NEGATIVE 11/08/2017 1630     Cultures:    Component Value Date/Time   SDES  11/08/2017 1929    BLOOD LEFT HAND Performed at [facility], [address], [place], [place] [address]    SPECREQUEST  11/08/2017 1929    BOTTLES DRAWN AEROBIC AND ANAEROBIC Blood Culture results may not be optimal due to an inadequate volume of blood received in culture bottles Performed at [facility], [address], [place], [place] [address]    CULT PENDING 11/08/2017 1929   REPTSTATUS PENDING 11/08/2017 1929     Radiological Exams on Admission: Dg Chest 2 View  Result Date: 11/08/2017 CLINICAL DATA:  Continued back and abdominal pain with fever. Pain is worsened  since yesterday. EXAM: CHEST - 2 VIEW COMPARISON:  CT abdomen and pelvis from earlier on the same day. FINDINGS: The heart size and mediastinal contours are within normal limits. Both lungs are clear. Mild eventration of the right hemidiaphragm. Atelectasis posteriorly at the lung bases as seen on the same day CT likely accounts for the slightly irregular appearance of the posterior costophrenic angle. No pneumothorax or effusion. No acute osseous appearing abnormality. Degenerative changes are noted along the midthoracic spine. IMPRESSION: No active cardiopulmonary disease. Electronically Signed   By: Tollie Eth M.D.   On: 11/08/2017 23:39   Ct Abdomen Pelvis W Contrast  Result Date: 11/08/2017 CLINICAL DATA:  Lower abdominal and back pain for the past 10 days. EXAM: CT ABDOMEN AND PELVIS WITH CONTRAST TECHNIQUE: Multidetector CT imaging of the abdomen and pelvis was performed using the standard protocol following bolus administration of intravenous contrast. CONTRAST:  ISOVUE-300 IOPAMIDOL (ISOVUE-300) INJECTION 61% COMPARISON:  CT abdomen pelvis dated October 28, 2017. FINDINGS: Lower chest: No acute abnormality. Hepatobiliary: No focal liver abnormality. Persistent gallbladder distention and mild common bile duct dilatation to 9 mm, similar to prior study. No gallstones or gallbladder wall thickening. Pancreas: Unremarkable. No pancreatic ductal dilatation or surrounding inflammatory changes. Spleen: Normal in size without focal abnormality. Adrenals/Urinary Tract: The adrenal glands are unremarkable. Resolved bilateral hydroureteronephrosis. Bilateral nonobstructing renal calculi again noted. No ureteral calculi. The bladder is unremarkable. Stomach/Bowel: Stomach is within normal limits. Appendix appears normal. No evidence of bowel wall thickening, distention, or inflammatory changes. Vascular/Lymphatic: No significant vascular findings are present. No enlarged abdominal or pelvic lymph nodes.  Reproductive: IUD appropriately positioned within the uterus. No adnexal mass. Other: No free fluid or pneumoperitoneum. Musculoskeletal: No acute or significant osseous findings. IMPRESSION: 1. Unchanged gallbladder distention and mild common bile duct dilatation without CT evidence of cholelithiasis or choledocholithiasis. 2. Resolved bilateral hydroureteronephrosis. 3. Unchanged bilateral nonobstructive nephrolithiasis. Electronically Signed   By: Obie Dredge M.D.   On: 11/08/2017 21:43    Chart has been reviewed    Assessment/Plan   42 y.o. female with medical history significant of HTN, migraine  recent urinary retention Admitted for abdominal pain and fever  Present on Admission: . SIRS (systemic inflammatory response syndrome) (HCC) source is unclear but given cholelithiasis will obtain HIDA scan appreciate surgery consult as well as GI consult currently LFTs improving abdominal pain in the suprapubic region at typical for gallbladder disease.  Await results of blood and urine culture . Lower abdominal pain order HIDA scan discussed with general surgery if HIDA scan positive will need cholecystectomy in the near future for now cover with Zosyn given Sirs most likely intra-abdominal infection being the source History of urinary retention -continue Flomax CT showing no evidence of hydronephrosis or perinephric abscess  Other plan as per orders.  DVT prophylaxis:  SCD   Code Status:  FULL CODE   Family Communication:   Family   at  Bedside  plan of care was discussed with  Daughters,  Husband,  Disposition Plan:       To home once workup is complete and patient is stable                                           Consults called: [facility] and general surgery  Admission status:   inPatient  Level of care       medical floor      Therisa Doyne 11/08/2017, 11:42 PM    [facility]  Pager [phone]   after 2 AM please page floor coverage PA If 7AM-7PM, please  contact the day team taking care of the patient  [email or web address]  Password [email or web address]

## 2017-11-08 NOTE — ED Triage Notes (Signed)
Pt here for lower abd pain, back pain and fever. She was just seen in the ER. She is currently taking flomax. Having some dysuria

## 2017-11-09 ENCOUNTER — Inpatient Hospital Stay (HOSPITAL_COMMUNITY): Payer: Medicaid Other

## 2017-11-09 ENCOUNTER — Other Ambulatory Visit: Payer: Self-pay

## 2017-11-09 DIAGNOSIS — A419 Sepsis, unspecified organism: Principal | ICD-10-CM

## 2017-11-09 DIAGNOSIS — R748 Abnormal levels of other serum enzymes: Secondary | ICD-10-CM

## 2017-11-09 LAB — COMPREHENSIVE METABOLIC PANEL
ALK PHOS: 105 U/L (ref 38–126)
ALT: 338 U/L — AB (ref 14–54)
AST: 291 U/L — AB (ref 15–41)
Albumin: 3.4 g/dL — ABNORMAL LOW (ref 3.5–5.0)
Anion gap: 9 (ref 5–15)
BUN: 6 mg/dL (ref 6–20)
CHLORIDE: 104 mmol/L (ref 101–111)
CO2: 23 mmol/L (ref 22–32)
CREATININE: 0.47 mg/dL (ref 0.44–1.00)
Calcium: 8 mg/dL — ABNORMAL LOW (ref 8.9–10.3)
GFR calc Af Amer: >60 mL/min (ref >60)
Glucose, Bld: 131 mg/dL — ABNORMAL HIGH (ref 65–99)
Potassium: 3.4 mmol/L — ABNORMAL LOW (ref 3.5–5.1)
Sodium: 136 mmol/L (ref 135–145)
Total Bilirubin: 0.9 mg/dL (ref 0.3–1.2)
Total Protein: 6.6 g/dL (ref 6.5–8.1)

## 2017-11-09 LAB — CBC
HCT: 36.2 % (ref 36.0–46.0)
Hemoglobin: 11.8 g/dL — ABNORMAL LOW (ref 12.0–15.0)
MCH: 29.4 pg (ref 26.0–34.0)
MCHC: 32.6 g/dL (ref 30.0–36.0)
MCV: 90.3 fL (ref 78.0–100.0)
PLATELETS: 254 10*3/uL (ref 150–400)
RBC: 4.01 MIL/uL (ref 3.87–5.11)
RDW: 13.9 % (ref 11.5–15.5)
WBC: 10.1 10*3/uL (ref 4.0–10.5)

## 2017-11-09 LAB — LACTIC ACID, PLASMA
LACTIC ACID, VENOUS: 1 mmol/L (ref 0.5–1.9)
LACTIC ACID, VENOUS: 0.8 mmol/L (ref 0.5–1.9)

## 2017-11-09 LAB — MAGNESIUM: Magnesium: 1.4 mg/dL — ABNORMAL LOW (ref 1.7–2.4)

## 2017-11-09 LAB — HEMOGLOBIN A1C
Hgb A1c MFr Bld: 5.2 % (ref 4.8–5.6)
Mean Plasma Glucose: 102.54 mg/dL

## 2017-11-09 LAB — TSH: TSH: 2 u[IU]/mL (ref 0.350–4.500)

## 2017-11-09 LAB — SEDIMENTATION RATE: Sed Rate: 29 mm/hr — ABNORMAL HIGH (ref 0–22)

## 2017-11-09 LAB — PHOSPHORUS: Phosphorus: 3 mg/dL (ref 2.5–4.6)

## 2017-11-09 LAB — PROCALCITONIN: Procalcitonin: <0.1 ng/mL

## 2017-11-09 MED ORDER — ACETAMINOPHEN 325 MG PO TABS: 650.0000 mg | ORAL_TABLET | Freq: Four times a day (QID) | ORAL | Status: DC | PRN

## 2017-11-09 MED ORDER — ONDANSETRON HCL 4 MG/2ML IJ SOLN
4.0000 mg | Freq: Four times a day (QID) | INTRAMUSCULAR | Status: DC | PRN
  Administered 2017-11-09: 4 mg via INTRAVENOUS
  Filled 2017-11-09: qty 2

## 2017-11-09 MED ORDER — MORPHINE SULFATE (PF) 4 MG/ML IV SOLN
INTRAVENOUS | Status: AC
  Administered 2017-11-09: 3.1 mg via INTRAVENOUS
  Filled 2017-11-09: qty 1

## 2017-11-09 MED ORDER — MAGNESIUM SULFATE 2 GM/50ML IV SOLN
2.0000 g | Freq: Once | INTRAVENOUS | Status: AC
  Administered 2017-11-09: 2 g via INTRAVENOUS
  Filled 2017-11-09: qty 50

## 2017-11-09 MED ORDER — TAMSULOSIN HCL 0.4 MG PO CAPS
0.4000 mg | ORAL_CAPSULE | Freq: Every day | ORAL | Status: DC
  Administered 2017-11-09: 0.4 mg via ORAL
  Filled 2017-11-09: qty 1

## 2017-11-09 MED ORDER — ACETAMINOPHEN 650 MG RE SUPP
650.0000 mg | Freq: Four times a day (QID) | RECTAL | Status: DC | PRN
  Administered 2017-11-09: 650 mg via RECTAL
  Filled 2017-11-09: qty 1

## 2017-11-09 MED ORDER — ONDANSETRON HCL 4 MG PO TABS: 4.0000 mg | ORAL_TABLET | Freq: Four times a day (QID) | ORAL | Status: DC | PRN

## 2017-11-09 MED ORDER — PIPERACILLIN-TAZOBACTAM 3.375 G IVPB
3.3750 g | Freq: Once | INTRAVENOUS | Status: AC
  Administered 2017-11-09: 3.375 g via INTRAVENOUS
  Filled 2017-11-09: qty 50

## 2017-11-09 MED ORDER — TECHNETIUM TC 99M MEBROFENIN IV KIT
5.3800 | PACK | Freq: Once | INTRAVENOUS | Status: AC | PRN
  Administered 2017-11-09: 5.38 via INTRAVENOUS

## 2017-11-09 MED ORDER — KCL IN DEXTROSE-NACL 20-5-0.9 MEQ/L-%-% IV SOLN
INTRAVENOUS | Status: DC
  Administered 2017-11-09 – 2017-11-11 (×3): via INTRAVENOUS
  Filled 2017-11-09 (×4): qty 1000

## 2017-11-09 MED ORDER — MORPHINE SULFATE (PF) 2 MG/ML IV SOLN
2.0000 mg | Freq: Once | INTRAVENOUS | Status: AC
  Administered 2017-11-09: 2 mg via INTRAVENOUS
  Filled 2017-11-09: qty 1

## 2017-11-09 MED ORDER — PIPERACILLIN-TAZOBACTAM 3.375 G IVPB 30 MIN: 3.3750 g | Freq: Once | INTRAVENOUS | Status: DC

## 2017-11-09 MED ORDER — HYDROCODONE-ACETAMINOPHEN 5-325 MG PO TABS
1.0000 | ORAL_TABLET | ORAL | Status: DC | PRN
  Administered 2017-11-09: 2 via ORAL
  Administered 2017-11-09: 1 via ORAL
  Administered 2017-11-09: 2 via ORAL
  Administered 2017-11-10: 1 via ORAL
  Administered 2017-11-10 – 2017-11-11 (×3): 2 via ORAL
  Filled 2017-11-09 (×2): qty 2
  Filled 2017-11-09 (×2): qty 1
  Filled 2017-11-09 (×3): qty 2

## 2017-11-09 MED ORDER — SODIUM CHLORIDE 0.9 % IV SOLN
INTRAVENOUS | Status: DC
  Administered 2017-11-09: 15:00:00 via INTRAVENOUS

## 2017-11-09 MED ORDER — MORPHINE SULFATE (PF) 4 MG/ML IV SOLN
3.1000 mg | Freq: Once | INTRAVENOUS | Status: AC
  Administered 2017-11-09: 3.1 mg via INTRAVENOUS

## 2017-11-09 MED ORDER — PIPERACILLIN-TAZOBACTAM 3.375 G IVPB
3.3750 g | Freq: Three times a day (TID) | INTRAVENOUS | Status: DC
  Administered 2017-11-09 – 2017-11-11 (×7): 3.375 g via INTRAVENOUS
  Filled 2017-11-09 (×9): qty 50

## 2017-11-09 MED ORDER — SODIUM CHLORIDE 0.9 % IV SOLN
INTRAVENOUS | Status: AC
  Administered 2017-11-09: 01:00:00 via INTRAVENOUS

## 2017-11-09 MED ORDER — PIPERACILLIN-TAZOBACTAM 3.375 G IVPB 30 MIN
3.3750 g | Freq: Once | INTRAVENOUS | Status: DC
  Filled 2017-11-09: qty 50

## 2017-11-09 NOTE — Progress Notes (Signed)
PROGRESS NOTE  Sharon Johnson XLK:[id number] DOB: [birth year] DOA: 11/08/2017 PCP: Patient, No Pcp Per  HPI/Recap of past 24 hours: Patient is a 42 year old Spanish-speaking female with past medical history significant for hypertension, migraine presented to the ER complaining of lower abdominal pain, worse around the suprapubic area and flanks, dysuria, nausea/vomiting for the past 10 days. Of note, patient was recently discharged on 4/11 for similar symptoms but at that admission noted to have urinary retention with imaging showing bilateral hydrouteronephrosis and bladder distention.  Patient was noted to have mildly elevated LFTs as well as lipase at that time. CT done at that time showed nonobstructing stones in both kidneys without ureteral stone, gallbladder distention. A follow-up ultrasound done at that admission showed multiple mobile cholelithiasis, CBD 6mm. Patient was discharged on Flomax. In the ER, lipase and LFTs was within normal limits.  Patient had a temp of 102.1 and leukocytosis.  Repeat CT showed unchanged gallbladder distention and mild CBD dilatation, resolved bilateral hydroureteronephrosis, unchanged bilateral nonobstructive nephrolithiasis.  General surgery and GI were consulted, patient admitted for further management.  Today, patient still complained of suprapubic and flank pain, 6/10 in severity, pain meds helping.  No fever.  Mild dysuria present  Assessment/Plan: Active Problems:   Lower abdominal pain   SIRS (systemic inflammatory response syndrome) (HCC)  Sepsis 2/2 ??acute cholecystitis Vs pyelonephritis Currently afebrile, with resolved leukocytosis Unusual presentation clinically: Suprapubic and bilateral flank pain, dysuria Febrile, tachycardic, abdominal pain, leukocytosis on presentation LA and pro-calcitonin within normal limits Blood culture x2 pending UA showed rare bacteria, UC pending CT showed unchanged gallbladder distention and mild CBD  dilatation, resolved bilateral hydroureteronephrosis, unchanged bilateral nonobstructive nephrolithiasis HIDA scan pending Continue IV Zosyn Pain management, IV fluids GEN surge and GI on board, appreciate recs  Elevated LFTs AST 291, ALT 338, ALP and T bili within normal limits, ??Passed gallstone Hepatitis panel negative on 10/28/17 GI on board: if HIDA scan negative, with no improvement in symptoms or LFTs, recommend MRCP Monitor closely  Hypomagnesemia Replace as needed   Code Status: Full  Family Communication: Daughter and husband at bedside  Disposition Plan: Once workup complete   Consultants:  Gen surg  GI  Procedures:  None  Antimicrobials: IV Zosyn  DVT prophylaxis: SCDs, hold anticoagulation for possible surgery   Objective: Vitals:   11/09/17 0345 11/09/17 0403 11/09/17 0628 11/09/17 0935  BP: 135/71  129/68 123/73  Pulse: 87  82 81  Resp: 19  17 16   Temp: 99 F (37.2 C)  98.5 F (36.9 C) 98.1 F (36.7 C)  TempSrc: Oral  Oral Oral  SpO2: 91% 96% 96% 95%  Weight:      Height:        Intake/Output Summary (Last 24 hours) at 11/09/2017 1322 Last data filed at 11/09/2017 1158 Gross per 24 hour  Intake 3050 ml  Output 1500 ml  Net 1550 ml   Filed Weights   11/09/17 0100  Weight: 77.6 kg (171 lb)    Exam:   General: NAD  Cardiovascular: S1, S2 present  Respiratory: CTAB  Abdomen: Soft, supra pubic tenderness, bilateral flank tenderness, nondistended, bowel sounds present  Musculoskeletal: No pedal edema bilaterally  Skin: Normal  Psychiatry: Normal mood   Data Reviewed: CBC: Recent Labs  Lab 11/05/17 1129 11/08/17 1630 11/09/17 0407  WBC 6.5 12.9* 10.1  NEUTROABS 2.5  --   --   HGB 13.2 13.9 11.8*  HCT 39.6 41.0 36.2  MCV 89 88.7 90.3  PLT  358 273 254   Basic Metabolic Panel: Recent Labs  Lab 11/05/17 1129 11/08/17 1630 11/09/17 0407  NA 141 135 136  K 4.5 3.6 3.4*  CL 103 103 104  CO2 20 21* 23  GLUCOSE  86 118* 131*  BUN 8 7 6   CREATININE 0.55* 0.36* 0.47  CALCIUM 9.8 9.2 8.0*  MG  --   --  1.4*  PHOS  --   --  3.0   GFR: Estimated Creatinine Clearance: 88.4 mL/min (by C-G formula based on SCr of 0.47 mg/dL). Liver Function Tests: Recent Labs  Lab 11/05/17 1129 11/08/17 1630 11/09/17 0407  AST 18 18 291*  ALT 24 26 338*  ALKPHOS 61 59 105  BILITOT 0.6 0.5 0.9  PROT 7.4 7.7 6.6  ALBUMIN 4.8 4.4 3.4*   Recent Labs  Lab 11/08/17 1630  LIPASE 29   No results for input(s): AMMONIA in the last 168 hours. Coagulation Profile: No results for input(s): INR, PROTIME in the last 168 hours. Cardiac Enzymes: No results for input(s): CKTOTAL, CKMB, CKMBINDEX, TROPONINI in the last 168 hours. BNP (last 3 results) No results for input(s): PROBNP in the last 8760 hours. HbA1C: Recent Labs    11/09/17 0407  HGBA1C 5.2   CBG: No results for input(s): GLUCAP in the last 168 hours. Lipid Profile: No results for input(s): CHOL, HDL, LDLCALC, TRIG, CHOLHDL, LDLDIRECT in the last 72 hours. Thyroid Function Tests: Recent Labs    11/09/17 0407  TSH 2.000   Anemia Panel: No results for input(s): VITAMINB12, FOLATE, FERRITIN, TIBC, IRON, RETICCTPCT in the last 72 hours. Urine analysis:    Component Value Date/Time   COLORURINE STRAW (A) 11/08/2017 1630   APPEARANCEUR CLEAR 11/08/2017 1630   LABSPEC 1.002 (L) 11/08/2017 1630   PHURINE 7.0 11/08/2017 1630   GLUCOSEU NEGATIVE 11/08/2017 1630   HGBUR SMALL (A) 11/08/2017 1630   BILIRUBINUR NEGATIVE 11/08/2017 1630   BILIRUBINUR negative 11/05/2017 1032   KETONESUR NEGATIVE 11/08/2017 1630   PROTEINUR NEGATIVE 11/08/2017 1630   UROBILINOGEN 0.2 11/05/2017 1032   NITRITE NEGATIVE 11/08/2017 1630   LEUKOCYTESUR NEGATIVE 11/08/2017 1630   Sepsis Labs: @LABRCNTIP (procalcitonin:4,lacticidven:4)  ) Recent Results (from the past 240 hour(s))  Urine Culture     Status: None   Collection Time: 11/05/17 11:08 AM  Result Value Ref  Range Status   Urine Culture, Routine Final report  Final   Organism ID, Bacteria Comment  Final    Comment: Mixed urogenital flora Less than 10,000 colonies/mL   Urine culture     Status: None (Preliminary result)   Collection Time: 11/08/17  6:54 PM  Result Value Ref Range Status   Specimen Description   Final    URINE, CLEAN CATCH Performed at [facility], [address], [place], [place] [address]    Special Requests   Final    NONE Performed at [facility], [address], [place], [place] [address]    Culture PENDING  Incomplete   Report Status PENDING  Incomplete  Blood culture (routine x 2)     Status: None (Preliminary result)   Collection Time: 11/08/17  7:24 PM  Result Value Ref Range Status   Specimen Description   Final    BLOOD RIGHT ANTECUBITAL Performed at [facility], [address] [place], [place] [address]    Special Requests   Final    BOTTLES DRAWN AEROBIC AND ANAEROBIC Blood Culture results may not be optimal due to an  excessive volume of blood received in culture bottles Performed at [facility], [address], [place], [place] [address]    Culture   Final    NO GROWTH < 24 HOURS Performed at [facility], [address], [place], [place] [address]    Report Status PENDING  Incomplete  Blood culture (routine x 2)     Status: None (Preliminary result)   Collection Time: 11/08/17  7:29 PM  Result Value Ref Range Status   Specimen Description   Final    BLOOD LEFT HAND Performed at [facility], [address] [place], [place] [address]    Special Requests   Final    BOTTLES DRAWN AEROBIC AND ANAEROBIC Blood Culture results may not be optimal due to an inadequate volume of blood received in culture bottles Performed at [facility], [address], [place], [place] [address]    Culture   Final    NO GROWTH < 24 HOURS Performed at [facility], [address] [place], [place] [address]    Report Status PENDING  Incomplete      Studies: Dg Chest 2 View  Result Date: 11/08/2017 CLINICAL DATA:  Continued back and abdominal pain with fever. Pain is worsened since yesterday. EXAM: CHEST - 2 VIEW COMPARISON:  CT abdomen and pelvis from earlier on the same day. FINDINGS: The heart size and mediastinal contours are within normal limits. Both lungs are clear. Mild eventration of the right hemidiaphragm. Atelectasis posteriorly at the lung bases as seen on the same day CT likely accounts for the slightly irregular appearance of the posterior costophrenic angle. No pneumothorax or effusion. No acute osseous appearing abnormality. Degenerative changes are noted along the midthoracic spine. IMPRESSION: No active cardiopulmonary disease. Electronically Signed   By: Tollie Eth M.D.   On: 11/08/2017 23:39   Ct Abdomen Pelvis W Contrast  Result Date: 11/08/2017 CLINICAL DATA:  Lower abdominal and back pain for the past 10 days. EXAM: CT ABDOMEN AND PELVIS WITH CONTRAST TECHNIQUE: Multidetector CT imaging of the abdomen and pelvis was performed using the standard protocol following bolus administration of intravenous contrast. CONTRAST:  ISOVUE-300 IOPAMIDOL (ISOVUE-300) INJECTION 61% COMPARISON:  CT abdomen pelvis dated October 28, 2017. FINDINGS: Lower chest: No acute abnormality. Hepatobiliary: No focal liver abnormality. Persistent gallbladder distention and mild common bile duct dilatation to 9 mm, similar to prior study. No gallstones or gallbladder wall thickening. Pancreas: Unremarkable. No pancreatic ductal dilatation or surrounding inflammatory changes. Spleen: Normal in size without focal abnormality. Adrenals/Urinary Tract: The adrenal glands are unremarkable. Resolved bilateral hydroureteronephrosis. Bilateral nonobstructing renal calculi again noted. No ureteral calculi. The bladder is unremarkable. Stomach/Bowel: Stomach is within normal  limits. Appendix appears normal. No evidence of bowel wall thickening, distention, or inflammatory changes. Vascular/Lymphatic: No significant vascular findings are present. No enlarged abdominal or pelvic lymph nodes. Reproductive: IUD appropriately positioned within the uterus. No adnexal mass. Other: No free fluid or pneumoperitoneum. Musculoskeletal: No acute or significant osseous findings. IMPRESSION: 1. Unchanged gallbladder distention and mild common bile duct dilatation without CT evidence of cholelithiasis or choledocholithiasis. 2. Resolved bilateral hydroureteronephrosis. 3. Unchanged bilateral nonobstructive nephrolithiasis. Electronically Signed   By: Obie Dredge M.D.   On: 11/08/2017 21:43    Scheduled Meds: . tamsulosin  0.4 mg Oral Daily    Continuous Infusions: . piperacillin-tazobactam (ZOSYN)  IV 3.375 g (11/09/17 0915)     LOS: 1 day  Briant Cedar, MD [facility]   If 7PM-7AM, please contact night-coverage [email or web address] Password [email or web address] 11/09/2017, 1:22 PM

## 2017-11-09 NOTE — Progress Notes (Signed)
[facility] [place] [facility] Progress Note     Subjective: CC- abdominal pain Patient states that she is feeling a little better this morning. Continues to have lower abdominal and bilateral flank pain, but somewhat improved from yesterday. She did vomit this morning after taking norco. She reports some mild dysuria.   Objective: Vital signs in last 24 hours: Temp:  [98.5 F (36.9 C)-102.1 F (38.9 C)] 98.5 F (36.9 C) (04/23 0628) Pulse Rate:  [82-118] 82 (04/23 0628) Resp:  [17-20] 17 (04/23 0628) BP: (104-149)/(49-102) 129/68 (04/23 0628) SpO2:  [91 %-99 %] 96 % (04/23 0628) Weight:  [171 lb (77.6 kg)] 171 lb (77.6 kg) (04/23 0100) Last BM Date: 11/08/17  Intake/Output from previous day: 04/22 0701 - 04/23 0700 In: 2100 [IV Piggyback:2100] Out: 1000 [Urine:550; Emesis/NG output:450] Intake/Output this shift: No intake/output data recorded.  PE: Gen:  Alert, NAD, pleasant HEENT: EOM's intact, pupils equal and round Card:  RRR, no M/G/R heard Pulm:  CTAB, no W/R/R, effort normal Abd: Soft, ND, mild lower abdominal TTP without guarding or rebound, no upper abdominal TTP, +BS, no HSM, no hernia Ext:  Calves soft and nontender Psych: A&Ox3  Skin: no rashes noted, warm and dry  Lab Results:  Recent Labs    11/08/17 1630 11/09/17 0407  WBC 12.9* 10.1  HGB 13.9 11.8*  HCT 41.0 36.2  PLT 273 254   BMET Recent Labs    11/08/17 1630 11/09/17 0407  NA 135 136  K 3.6 3.4*  CL 103 104  CO2 21* 23  GLUCOSE 118* 131*  BUN 7 6  CREATININE 0.36* 0.47  CALCIUM 9.2 8.0*   PT/INR No results for input(s): LABPROT, INR in the last 72 hours. CMP     Component Value Date/Time   NA 136 11/09/2017 0407   NA 141 11/05/2017 1129   K 3.4 (L) 11/09/2017 0407   CL 104 11/09/2017 0407   CO2 23 11/09/2017 0407   GLUCOSE 131 (H) 11/09/2017 0407   BUN 6 11/09/2017 0407   BUN 8 11/05/2017 1129   CREATININE 0.47 11/09/2017 0407   CALCIUM 8.0 (L) 11/09/2017 0407   PROT 6.6  11/09/2017 0407   PROT 7.4 11/05/2017 1129   ALBUMIN 3.4 (L) 11/09/2017 0407   ALBUMIN 4.8 11/05/2017 1129   AST 291 (H) 11/09/2017 0407   ALT 338 (H) 11/09/2017 0407   ALKPHOS 105 11/09/2017 0407   BILITOT 0.9 11/09/2017 0407   BILITOT 0.6 11/05/2017 1129   GFRNONAA >60 11/09/2017 0407   GFRAA >60 11/09/2017 0407   Lipase     Component Value Date/Time   LIPASE 29 11/08/2017 1630       Studies/Results: Dg Chest 2 View  Result Date: 11/08/2017 CLINICAL DATA:  Continued back and abdominal pain with fever. Pain is worsened since yesterday. EXAM: CHEST - 2 VIEW COMPARISON:  CT abdomen and pelvis from earlier on the same day. FINDINGS: The heart size and mediastinal contours are within normal limits. Both lungs are clear. Mild eventration of the right hemidiaphragm. Atelectasis posteriorly at the lung bases as seen on the same day CT likely accounts for the slightly irregular appearance of the posterior costophrenic angle. No pneumothorax or effusion. No acute osseous appearing abnormality. Degenerative changes are noted along the midthoracic spine. IMPRESSION: No active cardiopulmonary disease. Electronically Signed   By: Ashley Royalty M.D.   On: 11/08/2017 23:39   Ct Abdomen Pelvis W Contrast  Result Date: 11/08/2017 CLINICAL DATA:  Lower abdominal and back pain for  the past 10 days. EXAM: CT ABDOMEN AND PELVIS WITH CONTRAST TECHNIQUE: Multidetector CT imaging of the abdomen and pelvis was performed using the standard protocol following bolus administration of intravenous contrast. CONTRAST:  174m ISOVUE-300 IOPAMIDOL (ISOVUE-300) INJECTION 61% COMPARISON:  CT abdomen pelvis dated October 28, 2017. FINDINGS: Lower chest: No acute abnormality. Hepatobiliary: No focal liver abnormality. Persistent gallbladder distention and mild common bile duct dilatation to 9 mm, similar to prior study. No gallstones or gallbladder wall thickening. Pancreas: Unremarkable. No pancreatic ductal dilatation or  surrounding inflammatory changes. Spleen: Normal in size without focal abnormality. Adrenals/Urinary Tract: The adrenal glands are unremarkable. Resolved bilateral hydroureteronephrosis. Bilateral nonobstructing renal calculi again noted. No ureteral calculi. The bladder is unremarkable. Stomach/Bowel: Stomach is within normal limits. Appendix appears normal. No evidence of bowel wall thickening, distention, or inflammatory changes. Vascular/Lymphatic: No significant vascular findings are present. No enlarged abdominal or pelvic lymph nodes. Reproductive: IUD appropriately positioned within the uterus. No adnexal mass. Other: No free fluid or pneumoperitoneum. Musculoskeletal: No acute or significant osseous findings. IMPRESSION: 1. Unchanged gallbladder distention and mild common bile duct dilatation without CT evidence of cholelithiasis or choledocholithiasis. 2. Resolved bilateral hydroureteronephrosis. 3. Unchanged bilateral nonobstructive nephrolithiasis. Electronically Signed   By: WTitus DubinM.D.   On: 11/08/2017 21:43    Anti-infectives: Anti-infectives (From admission, onward)   Start     Dose/Rate Route Frequency Ordered Stop   11/09/17 0800  piperacillin-tazobactam (ZOSYN) IVPB 3.375 g     3.375 g 12.5 mL/hr over 240 Minutes Intravenous Every 8 hours 11/09/17 0040     11/09/17 0100  piperacillin-tazobactam (ZOSYN) IVPB 3.375 g  Status:  Discontinued     3.375 g 100 mL/hr over 30 Minutes Intravenous  Once 11/09/17 0051 11/09/17 0052   11/09/17 0100  piperacillin-tazobactam (ZOSYN) IVPB 3.375 g     3.375 g 12.5 mL/hr over 240 Minutes Intravenous  Once 11/09/17 0051 11/09/17 0534   11/09/17 0045  piperacillin-tazobactam (ZOSYN) IVPB 3.375 g  Status:  Discontinued     3.375 g 100 mL/hr over 30 Minutes Intravenous  Once 11/09/17 0034 11/09/17 0050   11/08/17 2200  metroNIDAZOLE (FLAGYL) IVPB 500 mg     500 mg 100 mL/hr over 60 Minutes Intravenous  Once 11/08/17 2156 11/08/17 2307    11/08/17 1930  cefTRIAXone (ROCEPHIN) 2 g in sodium chloride 0.9 % 100 mL IVPB     2 g 200 mL/hr over 30 Minutes Intravenous  Once 11/08/17 1924 11/08/17 2104       Assessment/Plan Nephrolithiasis - taking flomax Bilateral hydroureteronephrosis - resolved on CT 4/22 Elevated hCG - 5.5  Abdominal pain, n/v  Cholelithiasis - on u/s 4/11 Elevated LFTs Fever - TMAX 102.1 last 24hr - CT scan 4/22 showed gallbladder distention and mild common bile duct dilatation without CT evidence of cholelithiasis or choledocholithiasis - u/s 4/11 showed cholelithiasis with no sonographic features to suggest acute cholecystitis; common bile duct measures at the upper limits of normal at approximately 6 mm; no choledocholithiasis - this AM AST 291 and ALT 338, alk phos and bilirubin WNL  ID - zosyn 4/23>>. Ucx pending FEN - IVF, NPO for imaging study VTE - SCDs Foley - none  Plan - HIDA scan pending, will make further recommendations once results are back. Continue broad spectrum antibiotics.   LOS: 1 day    BWellington Hampshire, PNov[facility] Surgery[facility] 11/09/2017, 9:31 AM Pager: [phone]: [phone]-Fri 7:00 am-4:30 pm Sat-Sun 7:00 am-11:30 am

## 2017-11-09 NOTE — Progress Notes (Signed)
Pharmacy Antibiotic Note  Sharon Johnson is a 42 y.o. female admitted on 11/08/2017 with IAI.  Pharmacy has been consulted for zosyn dosing.  Plan: Zosyn 3.375g IV q8h (4 hour infusion). Pharmacy to sign off   Temp (24hrs), Avg:101.1 F (38.4 C), Min:100.2 F (37.9 C), Max:102.1 F (38.9 C)  Recent Labs  Lab 11/05/17 1129 11/08/17 1630 11/08/17 1638  WBC 6.5 12.9*  --   CREATININE 0.55* 0.36*  --   LATICACIDVEN  --   --  1.41    Estimated Creatinine Clearance: 91.4 mL/min (A) (by C-G formula based on SCr of 0.36 mg/dL (L)).    No Known Allergies  Thank you for allowing pharmacy to be a part of this patient's care. Sharon Johnson, Pharm.D. [phone] 11/09/2017 12:47 AM

## 2017-11-09 NOTE — Progress Notes (Signed)
HIDA scan Nonvisualization of the GB.  She is on IV fluids, Zosyn, recheck labs in AM and discuss surgery tomorrow.  She is NPO after MN.

## 2017-11-09 NOTE — Consult Note (Signed)
Referring Provider: [facility] Primary Care Physician:  Patient, No Pcp Per Primary Gastroenterologist:  unassigned  Reason for Consultation:  Abnormal LFTs, fever  HPI: VALINCIA TOUCH is a 42 y.o. female presented to the hospital with abdominal pain, back pain and fever.patient was admitted to the hospital on 10/28/2017 with nausea vomiting abdominal pain after having vitamin infusion for migraine.she was found to have mildly elevated LFTs at that time. She also had a mildly elevated lipase. Ultrasound at that time showed no acute changes but CBD of 6 mm. CT scan was negative for acute pancreatitis.CT scan yesterday showed persistent gallbladder distention and CBD of 9 mm is similar to prior study. No gallstones. No other acute changes.  Patient seen and examined at bedside. History obtained with the help of daughter. Patient complaining of lower abdominal pain as well as bilateral upper quadrant pain. Associated with nausea, vomiting and high-grade fever. Denied any diarrhea and constipation. Symptoms ongoing for last 10 days. Pain is constant, gets better with pain medication.  No family history of colon cancer. No previous EGD or colonoscopy.  Past Medical History:  Diagnosis Date  . Hypertension   . Migraine     History reviewed. No pertinent surgical history.  Prior to Admission medications   Medication Sig Start Date End Date Taking? Authorizing Provider  ibuprofen (ADVIL,MOTRIN) 800 MG tablet Take 1 tablet (800 mg total) by mouth every 8 (eight) hours as needed (for pain.). 11/05/17  Yes Myles Lipps, MD  levonorgestrel (MIRENA) 20 MCG/24HR IUD 1 each by Intrauterine route once.   Yes [provider]  Multiple Vitamins-Minerals (MULTIVITAMIN GUMMIES ADULT) CHEW Chew 1 tablet by mouth daily.   Yes [provider]  tamsulosin (FLOMAX) 0.4 MG CAPS capsule Take 1 capsule (0.4 mg total) by mouth daily. 11/05/17  Yes Myles Lipps, MD    Scheduled Meds: .  tamsulosin  0.4 mg Oral Daily   Continuous Infusions: . sodium chloride 100 mL/hr at 11/09/17 0100  . magnesium sulfate 1 - 4 g bolus IVPB    . piperacillin-tazobactam (ZOSYN)  IV     PRN Meds:.acetaminophen **OR** acetaminophen, HYDROcodone-acetaminophen, ondansetron **OR** ondansetron (ZOFRAN) IV  Allergies as of 11/08/2017  . (No Known Allergies)    Family History  Problem Relation Age of Onset  . Ovarian cancer Mother     Social History   Socioeconomic History  . Marital status: Single    Spouse name: Not on file  . Number of children: Not on file  . Years of education: Not on file  . Highest education level: Not on file  Occupational History  . Not on file  Social Needs  . Financial resource strain: Not on file  . Food insecurity:    Worry: Not on file    Inability: Not on file  . Transportation needs:    Medical: Not on file    Non-medical: Not on file  Tobacco Use  . Smoking status: Never Smoker  . Smokeless tobacco: Never Used  Substance and Sexual Activity  . Alcohol use: No    Alcohol/week: 0.0 oz  . Drug use: No  . Sexual activity: Not Currently  Lifestyle  . Physical activity:    Days per week: Not on file    Minutes per session: Not on file  . Stress: Not on file  Relationships  . Social connections:    Talks on phone: Not on file    Gets together: Not on file    Attends religious  service: Not on file    Active member of club or organization: Not on file    Attends meetings of clubs or organizations: Not on file    Relationship status: Not on file  . Intimate partner violence:    Fear of current or ex partner: Not on file    Emotionally abused: Not on file    Physically abused: Not on file    Forced sexual activity: Not on file  Other Topics Concern  . Not on file  Social History Narrative   houskeeping    Review of Systems: Review of Systems  Constitutional: Positive for chills, fever and malaise/fatigue.  HENT: Negative for hearing  loss and tinnitus.   Eyes: Negative for blurred vision and double vision.  Respiratory: Negative for cough, hemoptysis and sputum production.   Cardiovascular: Negative for chest pain and palpitations.  Gastrointestinal: Positive for abdominal pain, heartburn, nausea and vomiting. Negative for blood in stool, constipation, diarrhea and melena.  Genitourinary: Negative for dysuria.  Musculoskeletal: Positive for back pain. Negative for neck pain.  Skin: Negative for itching and rash.  Neurological: Positive for headaches. Negative for focal weakness.  Endo/Heme/Allergies: Does not bruise/bleed easily.  Psychiatric/Behavioral: Negative for hallucinations and suicidal ideas.    Physical Exam: Vital signs: Vitals:   11/09/17 0403 11/09/17 0628  BP:  129/68  Pulse:  82  Resp:  17  Temp:  98.5 F (36.9 C)  SpO2: 96% 96%   Last BM Date: 11/08/17 Physical Exam  Constitutional: She is oriented to person, place, and time. She appears well-developed and well-nourished. No distress.  HENT:  Head: Normocephalic and atraumatic.  Mouth/Throat: Oropharynx is clear and moist. No oropharyngeal exudate.  Eyes: EOM are normal. No scleral icterus.  Neck: Normal range of motion. Neck supple.  Cardiovascular: Normal rate and regular rhythm.  Pulmonary/Chest: Effort normal and breath sounds normal. No respiratory distress.  Abdominal: Soft. Bowel sounds are normal. She exhibits no distension. There is tenderness. There is no rebound and no guarding.  Generalized discomfort on palpation. Bilateral lower quadrant as well as mild right upper quadrant tenderness.  Musculoskeletal: Normal range of motion. She exhibits no edema.  Neurological: She is alert and oriented to person, place, and time.  Skin: Skin is warm. No erythema.  Psychiatric: She has a normal mood and affect. Judgment normal.  Vitals reviewed.  GI:  Lab Results: Recent Labs    11/08/17 1630 11/09/17 0407  WBC 12.9* 10.1  HGB 13.9  11.8*  HCT 41.0 36.2  PLT 273 254   BMET Recent Labs    11/08/17 1630 11/09/17 0407  NA 135 136  K 3.6 3.4*  CL 103 104  CO2 21* 23  GLUCOSE 118* 131*  BUN 7 6  CREATININE 0.36* 0.47  CALCIUM 9.2 8.0*   LFT Recent Labs    11/09/17 0407  PROT 6.6  ALBUMIN 3.4*  AST 291*  ALT 338*  ALKPHOS 105  BILITOT 0.9   PT/INR No results for input(s): LABPROT, INR in the last 72 hours.   Studies/Results: Dg Chest 2 View  Result Date: 11/08/2017 CLINICAL DATA:  Continued back and abdominal pain with fever. Pain is worsened since yesterday. EXAM: CHEST - 2 VIEW COMPARISON:  CT abdomen and pelvis from earlier on the same day. FINDINGS: The heart size and mediastinal contours are within normal limits. Both lungs are clear. Mild eventration of the right hemidiaphragm. Atelectasis posteriorly at the lung bases as seen on the same day CT likely  accounts for the slightly irregular appearance of the posterior costophrenic angle. No pneumothorax or effusion. No acute osseous appearing abnormality. Degenerative changes are noted along the midthoracic spine. IMPRESSION: No active cardiopulmonary disease. Electronically Signed   By: Tollie Ethavid  Kwon M.D.   On: 11/08/2017 23:39   Ct Abdomen Pelvis W Contrast  Result Date: 11/08/2017 CLINICAL DATA:  Lower abdominal and back pain for the past 10 days. EXAM: CT ABDOMEN AND PELVIS WITH CONTRAST TECHNIQUE: Multidetector CT imaging of the abdomen and pelvis was performed using the standard protocol following bolus administration of intravenous contrast. CONTRAST:  100mL ISOVUE-300 IOPAMIDOL (ISOVUE-300) INJECTION 61% COMPARISON:  CT abdomen pelvis dated October 28, 2017. FINDINGS: Lower chest: No acute abnormality. Hepatobiliary: No focal liver abnormality. Persistent gallbladder distention and mild common bile duct dilatation to 9 mm, similar to prior study. No gallstones or gallbladder wall thickening. Pancreas: Unremarkable. No pancreatic ductal dilatation or  surrounding inflammatory changes. Spleen: Normal in size without focal abnormality. Adrenals/Urinary Tract: The adrenal glands are unremarkable. Resolved bilateral hydroureteronephrosis. Bilateral nonobstructing renal calculi again noted. No ureteral calculi. The bladder is unremarkable. Stomach/Bowel: Stomach is within normal limits. Appendix appears normal. No evidence of bowel wall thickening, distention, or inflammatory changes. Vascular/Lymphatic: No significant vascular findings are present. No enlarged abdominal or pelvic lymph nodes. Reproductive: IUD appropriately positioned within the uterus. No adnexal mass. Other: No free fluid or pneumoperitoneum. Musculoskeletal: No acute or significant osseous findings. IMPRESSION: 1. Unchanged gallbladder distention and mild common bile duct dilatation without CT evidence of cholelithiasis or choledocholithiasis. 2. Resolved bilateral hydroureteronephrosis. 3. Unchanged bilateral nonobstructive nephrolithiasis. Electronically Signed   By: Obie DredgeWilliam T Derry M.D.   On: 11/08/2017 21:43    Impression/Plan: - abdominal pain, nausea and vomiting. Mild elevated AST and ALT. Normal total bilirubin alkaline phosphatase. Imaging study shows CBD dilatation without any gallbladder or common bile duct stones. - Fever with T max 102.1 . Resolved now   Recommendations ------------------------- - Agree with HIDA scan to rule out biliary dyskinesia / acalculus cholecystitis.hepatitis panel negative during last admission. Patient has normal T bili and alkaline phosphatase, making biliary obstruction less likely.she may have passed, small gallstone which may result in biliary ductal dilatation. If HIDA scan negative and there is no improvement in symptoms or LFTs, recommend MRI MRCP for further evaluation. - continue antibiotics for now. Diet as tolerated. - GI will follow    LOS: 1 day   Kathi DerParag Jachelle Fluty  MD, FACP 11/09/2017, 8:14 AM  Contact #  [phone]

## 2017-11-09 NOTE — Plan of Care (Signed)
NPO, voiding, plan of care discussed.

## 2017-11-09 NOTE — ED Notes (Signed)
Gave report to David, RN.

## 2017-11-09 NOTE — ED Notes (Signed)
ED TO INPATIENT HANDOFF REPORT  Name/Age/Gender Clearnce Sharon Johnson 42 y.o. female  Code Status Code Status History    Date Active Date Inactive Code Status Order ID Comments User Context   10/28/2017 0543 10/28/2017 2244 Full Code [id number]  Norval Morton, MD ED      Home/SNF/Other Home  Chief Complaint Back pain   Level of Care/Admitting Diagnosis ED Disposition    ED Disposition Condition [place] [facility] Area: [facility] [[id number]]  Level of Care: Med-Surg [16]  Diagnosis: SIRS (systemic inflammatory response syndrome) ([place]) [[id number]]  Admitting Physician: Toy Baker [[id number]]  Attending Physician: Toy Baker [[id number]]  Estimated length of stay: 3 - 4 days  Certification:: I certify this patient will need inpatient services for at least 2 midnights  PT Class (Do Not Modify): Inpatient [101]  PT Acc Code (Do Not Modify): Private [1]       Medical History Past Medical History:  Diagnosis Date  . Hypertension   . Migraine     Allergies No Known Allergies  IV Location/Drains/Wounds Patient Lines/Drains/Airways Status   Active Line/Drains/Airways    Name:   Placement date:   Placement time:   Site:   Days:   Peripheral IV 11/08/17 Right Antecubital   11/08/17    1808    Antecubital   1          Labs/Imaging Results for orders placed or performed during the hospital encounter of 11/08/17 (from the past 48 hour(s))  Lipase, blood     Status: None   Collection Time: 11/08/17  4:30 PM  Result Value Ref Range   Lipase 29 11 - 51 U/L    Comment: Performed at [facility], [place] [address] [place], [place] [address]  Comprehensive metabolic panel     Status: Abnormal   Collection Time: 11/08/17  4:30 PM  Result Value Ref Range   Sodium 135 135 - 145 mmol/L   Potassium 3.6 3.5 - 5.1 mmol/L   Chloride 103 101 - 111 mmol/L   CO2 21 (L) 22 - 32 mmol/L   Glucose, Bld 118 (H) 65 - 99 mg/dL   BUN 7 6  - 20 mg/dL   Creatinine, Ser 0.36 (L) 0.44 - 1.00 mg/dL   Calcium 9.2 8.9 - 10.3 mg/dL   Total Protein 7.7 6.5 - 8.1 g/dL   Albumin 4.4 3.5 - 5.0 g/dL   AST 18 15 - 41 U/L   ALT 26 14 - 54 U/L   Alkaline Phosphatase 59 38 - 126 U/L   Total Bilirubin 0.5 0.3 - 1.2 mg/dL   GFR calc non Af Amer >60 >60 mL/min   GFR calc Af Amer >60 >60 mL/min    Comment: (NOTE) The eGFR has been calculated using the CKD EPI equation. This calculation has not been validated in all clinical situations. eGFR's persistently <60 mL/min signify possible Chronic Kidney Disease.    Anion gap 11 5 - 15    Comment: Performed at [facility], [place] [address] [place],  [address]  CBC     Status: Abnormal   Collection Time: 11/08/17  4:30 PM  Result Value Ref Range   WBC 12.9 (H) 4.0 - 10.5 K/uL   RBC 4.62 3.87 - 5.11 MIL/uL   Hemoglobin 13.9 12.0 - 15.0 g/dL   HCT 41.0 36.0 - 46.0 %   MCV 88.7 78.0 - 100.0 fL   MCH 30.1 26.0 - 34.0 pg  MCHC 33.9 30.0 - 36.0 g/dL   RDW 13.7 11.5 - 15.5 %   Platelets 273 150 - 400 K/uL    Comment: Performed at [facility], [place] [address], [place], [place] [address]  Urinalysis, Routine w reflex microscopic     Status: Abnormal   Collection Time: 11/08/17  4:30 PM  Result Value Ref Range   Color, Urine STRAW (A) YELLOW   APPearance CLEAR CLEAR   Specific Gravity, Urine 1.002 (L) 1.005 - 1.030   pH 7.0 5.0 - 8.0   Glucose, UA NEGATIVE NEGATIVE mg/dL   Hgb urine dipstick SMALL (A) NEGATIVE   Bilirubin Urine NEGATIVE NEGATIVE   Ketones, ur NEGATIVE NEGATIVE mg/dL   Protein, ur NEGATIVE NEGATIVE mg/dL   Nitrite NEGATIVE NEGATIVE   Leukocytes, UA NEGATIVE NEGATIVE   RBC / HPF 0-5 0 - 5 RBC/hpf   WBC, UA 0-5 0 - 5 WBC/hpf   Bacteria, UA RARE (A) NONE SEEN   Squamous Epithelial / LPF 0-5 (A) NONE SEEN    Comment: Performed at [facility], [place] [address] [place], [place] [address]  I-Stat beta hCG blood, ED      Status: Abnormal   Collection Time: 11/08/17  4:36 PM  Result Value Ref Range   I-stat hCG, quantitative 5.5 (H) <5 mIU/mL   Comment 3            Comment:   GEST. AGE      CONC.  (mIU/mL)   <=1 WEEK        5 - 50     2 WEEKS       50 - 500     3 WEEKS       100 - 10,000     4 WEEKS     1,000 - 30,000        FEMALE AND NON-PREGNANT FEMALE:     LESS THAN 5 mIU/mL   I-Stat CG4 Lactic Acid, ED     Status: None   Collection Time: 11/08/17  4:38 PM  Result Value Ref Range   Lactic Acid, Venous 1.41 0.5 - 1.9 mmol/L  Urine culture     Status: None (Preliminary result)   Collection Time: 11/08/17  6:54 PM  Result Value Ref Range   Specimen Description      URINE, CLEAN CATCH Performed at [facility], [place] [address] [place], [place] [address]    Special Requests      NONE Performed at [facility], [place] [address], [place], [place] [address]    Culture PENDING    Report Status PENDING   hCG, quantitative, pregnancy     Status: None   Collection Time: 11/08/17  7:10 PM  Result Value Ref Range   hCG, Beta Chain, Quant, S <1 <5 mIU/mL    Comment:          GEST. AGE      CONC.  (mIU/mL)   <=1 WEEK        5 - 50     2 WEEKS       50 - 500     3 WEEKS       100 - 10,000     4 WEEKS     1,000 - 30,000     5 WEEKS     3,500 - 115,000   6-8 WEEKS     12,000 - 270,000    12 WEEKS     15,000 -  220,000        FEMALE AND NON-PREGNANT FEMALE:     LESS THAN 5 mIU/mL Performed at [facility], [place] [address], [place], [place] [address]   Blood culture (routine x 2)     Status: None (Preliminary result)   Collection Time: 11/08/17  7:29 PM  Result Value Ref Range   Specimen Description      BLOOD LEFT HAND Performed at [facility], [place] [address] [place], [place] [address]    Special Requests      BOTTLES DRAWN AEROBIC AND ANAEROBIC Blood Culture results may not be optimal due to an inadequate volume of blood received in culture  bottles Performed at [facility], [place] [address] [place], [place] [address]    Culture PENDING    Report Status PENDING    Dg Chest 2 View  Result Date: 11/08/2017 CLINICAL DATA:  Continued back and abdominal pain with fever. Pain is worsened since yesterday. EXAM: CHEST - 2 VIEW COMPARISON:  CT abdomen and pelvis from earlier on the same day. FINDINGS: The heart size and mediastinal contours are within normal limits. Both lungs are clear. Mild eventration of the right hemidiaphragm. Atelectasis posteriorly at the lung bases as seen on the same day CT likely accounts for the slightly irregular appearance of the posterior costophrenic angle. No pneumothorax or effusion. No acute osseous appearing abnormality. Degenerative changes are noted along the midthoracic spine. IMPRESSION: No active cardiopulmonary disease. Electronically Signed   By: Ashley Royalty M.D.   On: 11/08/2017 23:39   Ct Abdomen Pelvis W Contrast  Result Date: 11/08/2017 CLINICAL DATA:  Lower abdominal and back pain for the past 10 days. EXAM: CT ABDOMEN AND PELVIS WITH CONTRAST TECHNIQUE: Multidetector CT imaging of the abdomen and pelvis was performed using the standard protocol following bolus administration of intravenous contrast. CONTRAST:  182m ISOVUE-300 IOPAMIDOL (ISOVUE-300) INJECTION 61% COMPARISON:  CT abdomen pelvis dated October 28, 2017. FINDINGS: Lower chest: No acute abnormality. Hepatobiliary: No focal liver abnormality. Persistent gallbladder distention and mild common bile duct dilatation to 9 mm, similar to prior study. No gallstones or gallbladder wall thickening. Pancreas: Unremarkable. No pancreatic ductal dilatation or surrounding inflammatory changes. Spleen: Normal in size without focal abnormality. Adrenals/Urinary Tract: The adrenal glands are unremarkable. Resolved bilateral hydroureteronephrosis. Bilateral nonobstructing renal calculi again noted. No ureteral calculi. The bladder is  unremarkable. Stomach/Bowel: Stomach is within normal limits. Appendix appears normal. No evidence of bowel wall thickening, distention, or inflammatory changes. Vascular/Lymphatic: No significant vascular findings are present. No enlarged abdominal or pelvic lymph nodes. Reproductive: IUD appropriately positioned within the uterus. No adnexal mass. Other: No free fluid or pneumoperitoneum. Musculoskeletal: No acute or significant osseous findings. IMPRESSION: 1. Unchanged gallbladder distention and mild common bile duct dilatation without CT evidence of cholelithiasis or choledocholithiasis. 2. Resolved bilateral hydroureteronephrosis. 3. Unchanged bilateral nonobstructive nephrolithiasis. Electronically Signed   By: WTitus DubinM.D.   On: 11/08/2017 21:43    Pending Labs Unresulted Labs (From admission, onward)   Start     Ordered   11/08/17 2341  Sedimentation rate  Add-on,   R     11/08/17 2340   11/08/17 1924  Blood culture (routine x 2)  BLOOD CULTURE X 2,   STAT     11/08/17 1924   Signed and Held  Magnesium  Tomorrow morning,   R    Comments:  Call MD if <1.5    Signed and Held   Signed  and Held  Phosphorus  Tomorrow morning,   R     Signed and Held   Signed and Held  TSH  Once,   R    Comments:  Cancel if already done within 1 month and notify MD    Signed and Held   Signed and Held  Comprehensive metabolic panel  Once,   R    Comments:  Cal MD for K<3.5 or >5.0    Signed and Held   Signed and Held  CBC  Once,   R    Comments:  Call for hg <8.0    Signed and Held   Signed and Held  Hemoglobin A1c  Tomorrow morning,   R    Comments:  Cancel if has been done within past month and notify MD    Signed and Held   Signed and Held  Lactic acid, plasma  STAT Now then every 3 hours,   STAT     Signed and Held   Signed and Held  Procalcitonin  STAT,   R     Signed and Held      Vitals/Pain Today's Vitals   11/08/17 2250 11/08/17 2254 11/08/17 2300 11/08/17 2300  BP:  (!)  123/102 131/80   Pulse:  (!) 115 (!) 113   Resp:  20    Temp:      TempSrc:      SpO2:  94% 96%   PainSc: 9    10-Worst pain ever    Isolation Precautions No active isolations  Medications Medications  acetaminophen (TYLENOL) tablet 650 mg (650 mg Oral Given 11/08/17 1520)  cefTRIAXone (ROCEPHIN) 2 g in sodium chloride 0.9 % 100 mL IVPB (0 g Intravenous Stopped 11/08/17 2104)  sodium chloride 0.9 % bolus 1,000 mL (0 mLs Intravenous Stopped 11/08/17 2212)  ondansetron (ZOFRAN) injection 4 mg (4 mg Intravenous Given 11/08/17 1956)  morphine 4 MG/ML injection 4 mg (4 mg Intravenous Given 11/08/17 1956)  sodium chloride 0.9 % bolus 1,000 mL (0 mLs Intravenous Stopped 11/08/17 2107)  sodium chloride 0.9 % bolus 500 mL (0 mLs Intravenous Stopped 11/08/17 2327)  iopamidol (ISOVUE-300) 61 % injection 100 mL (100 mLs Intravenous Contrast Given 11/08/17 2112)  morphine 4 MG/ML injection 4 mg (4 mg Intravenous Given 11/08/17 2125)  metroNIDAZOLE (FLAGYL) IVPB 500 mg (0 mg Intravenous Stopped 11/08/17 2307)  morphine 4 MG/ML injection 4 mg (4 mg Intravenous Given 11/08/17 2256)    Mobility walks with person assist

## 2017-11-10 ENCOUNTER — Encounter (HOSPITAL_COMMUNITY)
Admission: EM | Disposition: A | Discharge status: Home Health | Payer: Self-pay | Source: Home / Self Care | Attending: Internal Medicine

## 2017-11-10 ENCOUNTER — Encounter (HOSPITAL_COMMUNITY): Payer: Self-pay | Admitting: General Practice

## 2017-11-10 ENCOUNTER — Inpatient Hospital Stay (HOSPITAL_COMMUNITY): Payer: Medicaid Other

## 2017-11-10 ENCOUNTER — Inpatient Hospital Stay (HOSPITAL_COMMUNITY): Payer: Medicaid Other | Admitting: Certified Registered Nurse Anesthetist

## 2017-11-10 HISTORY — PX: CHOLECYSTECTOMY: SHX55

## 2017-11-10 LAB — COMPREHENSIVE METABOLIC PANEL
ALBUMIN: 3.6 g/dL (ref 3.5–5.0)
ALK PHOS: 84 U/L (ref 38–126)
ALT: 186 U/L — ABNORMAL HIGH (ref 14–54)
AST: 59 U/L — ABNORMAL HIGH (ref 15–41)
Anion gap: 10 (ref 5–15)
BUN: 7 mg/dL (ref 6–20)
CALCIUM: 9 mg/dL (ref 8.9–10.3)
CO2: 24 mmol/L (ref 22–32)
CREATININE: 0.49 mg/dL (ref 0.44–1.00)
Chloride: 108 mmol/L (ref 101–111)
GFR calc Af Amer: >60 mL/min (ref >60)
GFR calc non Af Amer: >60 mL/min (ref >60)
GLUCOSE: 119 mg/dL — AB (ref 65–99)
Potassium: 3.9 mmol/L (ref 3.5–5.1)
SODIUM: 142 mmol/L (ref 135–145)
Total Bilirubin: 0.5 mg/dL (ref 0.3–1.2)
Total Protein: 6.9 g/dL (ref 6.5–8.1)

## 2017-11-10 LAB — CBC WITH DIFFERENTIAL/PLATELET
BASOS ABS: 0 10*3/uL (ref 0.0–0.1)
BASOS PCT: 0 %
EOS PCT: 3 %
Eosinophils Absolute: 0.2 10*3/uL (ref 0.0–0.7)
HCT: 36.5 % (ref 36.0–46.0)
Hemoglobin: 11.7 g/dL — ABNORMAL LOW (ref 12.0–15.0)
Lymphocytes Relative: 34 %
Lymphs Abs: 2.2 10*3/uL (ref 0.7–4.0)
MCH: 29.1 pg (ref 26.0–34.0)
MCHC: 32.1 g/dL (ref 30.0–36.0)
MCV: 90.8 fL (ref 78.0–100.0)
MONO ABS: 0.7 10*3/uL (ref 0.1–1.0)
MONOS PCT: 11 %
Neutro Abs: 3.3 10*3/uL (ref 1.7–7.7)
Neutrophils Relative %: 52 %
PLATELETS: 264 10*3/uL (ref 150–400)
RBC: 4.02 MIL/uL (ref 3.87–5.11)
RDW: 13.9 % (ref 11.5–15.5)
WBC: 6.4 10*3/uL (ref 4.0–10.5)

## 2017-11-10 LAB — LIPASE, BLOOD: LIPASE: 35 U/L (ref 11–51)

## 2017-11-10 LAB — URINE CULTURE: Culture: NO GROWTH

## 2017-11-10 LAB — MAGNESIUM: MAGNESIUM: 2 mg/dL (ref 1.7–2.4)

## 2017-11-10 SURGERY — LAPAROSCOPIC CHOLECYSTECTOMY WITH INTRAOPERATIVE CHOLANGIOGRAM
Anesthesia: General | Site: Abdomen

## 2017-11-10 MED ORDER — FENTANYL CITRATE (PF) 100 MCG/2ML IJ SOLN
INTRAMUSCULAR | Status: AC
  Filled 2017-11-10: qty 2

## 2017-11-10 MED ORDER — MIDAZOLAM HCL 2 MG/2ML IJ SOLN
INTRAMUSCULAR | Status: AC
  Filled 2017-11-10: qty 2

## 2017-11-10 MED ORDER — IOPAMIDOL (ISOVUE-300) INJECTION 61%
INTRAVENOUS | Status: AC
  Filled 2017-11-10: qty 50

## 2017-11-10 MED ORDER — LACTATED RINGERS IV SOLN
Freq: Once | INTRAVENOUS | Status: AC
  Administered 2017-11-10: 16:00:00 via INTRAVENOUS

## 2017-11-10 MED ORDER — SCOPOLAMINE 1 MG/3DAYS TD PT72
MEDICATED_PATCH | TRANSDERMAL | Status: AC
  Administered 2017-11-10: 1.5 mg
  Filled 2017-11-10: qty 1

## 2017-11-10 MED ORDER — DEXAMETHASONE SODIUM PHOSPHATE 10 MG/ML IJ SOLN
INTRAMUSCULAR | Status: AC
  Filled 2017-11-10: qty 1

## 2017-11-10 MED ORDER — PROPOFOL 10 MG/ML IV BOLUS
INTRAVENOUS | Status: AC
  Filled 2017-11-10: qty 20

## 2017-11-10 MED ORDER — HYDROMORPHONE HCL 1 MG/ML IJ SOLN
INTRAMUSCULAR | Status: AC
  Filled 2017-11-10: qty 1

## 2017-11-10 MED ORDER — LACTATED RINGERS IV SOLN
INTRAVENOUS | Status: DC | PRN
  Administered 2017-11-10: 16:00:00 via INTRAVENOUS

## 2017-11-10 MED ORDER — LIDOCAINE 2% (20 MG/ML) 5 ML SYRINGE
INTRAMUSCULAR | Status: DC | PRN
  Administered 2017-11-10: 75 mg via INTRAVENOUS

## 2017-11-10 MED ORDER — IOPAMIDOL (ISOVUE-300) INJECTION 61%
INTRAVENOUS | Status: DC | PRN
  Administered 2017-11-10: 19 mL via INTRAVENOUS

## 2017-11-10 MED ORDER — METOCLOPRAMIDE HCL 5 MG/ML IJ SOLN: 10.0000 mg | Freq: Once | INTRAMUSCULAR | Status: DC | PRN

## 2017-11-10 MED ORDER — HEPARIN SODIUM (PORCINE) 5000 UNIT/ML IJ SOLN
5000.0000 [IU] | Freq: Three times a day (TID) | INTRAMUSCULAR | Status: DC
Start: 2017-11-10 — End: 2017-11-13
  Administered 2017-11-10 – 2017-11-13 (×9): 5000 [IU] via SUBCUTANEOUS
  Filled 2017-11-10 (×9): qty 1

## 2017-11-10 MED ORDER — BUPIVACAINE-EPINEPHRINE (PF) 0.5% -1:200000 IJ SOLN
INTRAMUSCULAR | Status: AC
  Filled 2017-11-10: qty 30

## 2017-11-10 MED ORDER — FENTANYL CITRATE (PF) 250 MCG/5ML IJ SOLN
INTRAMUSCULAR | Status: DC | PRN
  Administered 2017-11-10 (×3): 50 ug via INTRAVENOUS
  Administered 2017-11-10: 100 ug via INTRAVENOUS
  Administered 2017-11-10: 50 ug via INTRAVENOUS

## 2017-11-10 MED ORDER — DEXAMETHASONE SODIUM PHOSPHATE 10 MG/ML IJ SOLN
INTRAMUSCULAR | Status: DC | PRN
  Administered 2017-11-10: 5 mg via INTRAVENOUS

## 2017-11-10 MED ORDER — ROCURONIUM BROMIDE 10 MG/ML (PF) SYRINGE
PREFILLED_SYRINGE | INTRAVENOUS | Status: AC
  Filled 2017-11-10: qty 5

## 2017-11-10 MED ORDER — SUGAMMADEX SODIUM 200 MG/2ML IV SOLN
INTRAVENOUS | Status: AC
  Filled 2017-11-10: qty 2

## 2017-11-10 MED ORDER — MEPERIDINE HCL 50 MG/ML IJ SOLN: 6.2500 mg | INTRAMUSCULAR | Status: DC | PRN

## 2017-11-10 MED ORDER — ROCURONIUM BROMIDE 10 MG/ML (PF) SYRINGE
PREFILLED_SYRINGE | INTRAVENOUS | Status: DC | PRN
  Administered 2017-11-10 (×2): 10 mg via INTRAVENOUS
  Administered 2017-11-10: 30 mg via INTRAVENOUS

## 2017-11-10 MED ORDER — CHLORHEXIDINE GLUCONATE CLOTH 2 % EX PADS
6.0000 | MEDICATED_PAD | Freq: Once | CUTANEOUS | Status: AC
  Administered 2017-11-10: 6 via TOPICAL

## 2017-11-10 MED ORDER — SUCCINYLCHOLINE CHLORIDE 200 MG/10ML IV SOSY
PREFILLED_SYRINGE | INTRAVENOUS | Status: AC
  Filled 2017-11-10: qty 10

## 2017-11-10 MED ORDER — ONDANSETRON HCL 4 MG/2ML IJ SOLN
INTRAMUSCULAR | Status: DC | PRN
  Administered 2017-11-10: 4 mg via INTRAVENOUS

## 2017-11-10 MED ORDER — BUPIVACAINE LIPOSOME 1.3 % IJ SUSP
20.0000 mL | Freq: Once | INTRAMUSCULAR | Status: AC
  Administered 2017-11-10: 20 mL
  Filled 2017-11-10 (×2): qty 20

## 2017-11-10 MED ORDER — SUGAMMADEX SODIUM 200 MG/2ML IV SOLN
INTRAVENOUS | Status: DC | PRN
  Administered 2017-11-10: 150 mg via INTRAVENOUS

## 2017-11-10 MED ORDER — HYDROMORPHONE HCL 1 MG/ML IJ SOLN
0.2500 mg | INTRAMUSCULAR | Status: DC | PRN
  Administered 2017-11-10: 0.5 mg via INTRAVENOUS

## 2017-11-10 MED ORDER — SCOPOLAMINE 1 MG/3DAYS TD PT72
1.0000 | MEDICATED_PATCH | TRANSDERMAL | Status: DC
  Administered 2017-11-10: 1.5 mg via TRANSDERMAL

## 2017-11-10 MED ORDER — ONDANSETRON HCL 4 MG/2ML IJ SOLN
INTRAMUSCULAR | Status: AC
  Filled 2017-11-10: qty 2

## 2017-11-10 MED ORDER — SUCCINYLCHOLINE CHLORIDE 200 MG/10ML IV SOSY
PREFILLED_SYRINGE | INTRAVENOUS | Status: DC | PRN
  Administered 2017-11-10: 100 mg via INTRAVENOUS

## 2017-11-10 MED ORDER — CHLORHEXIDINE GLUCONATE CLOTH 2 % EX PADS: 6.0000 | MEDICATED_PAD | Freq: Once | CUTANEOUS | Status: DC

## 2017-11-10 MED ORDER — PROPOFOL 10 MG/ML IV BOLUS
INTRAVENOUS | Status: DC | PRN
  Administered 2017-11-10: 170 mg via INTRAVENOUS

## 2017-11-10 MED ORDER — MIDAZOLAM HCL 5 MG/5ML IJ SOLN
INTRAMUSCULAR | Status: DC | PRN
  Administered 2017-11-10: 2 mg via INTRAVENOUS

## 2017-11-10 MED ORDER — LIDOCAINE 2% (20 MG/ML) 5 ML SYRINGE
INTRAMUSCULAR | Status: AC
  Filled 2017-11-10: qty 5

## 2017-11-10 SURGICAL SUPPLY — 36 items
APPLICATOR COTTON TIP 6IN STRL (MISCELLANEOUS) ×6 IMPLANT
APPLIER CLIP ROT 10 11.4 M/L (STAPLE) ×3
BENZOIN TINCTURE PRP APPL 2/3 (GAUZE/BANDAGES/DRESSINGS) IMPLANT
CABLE HIGH FREQUENCY MONO STRZ (ELECTRODE) ×3 IMPLANT
CATH REDDICK CHOLANGI 4FR 50CM (CATHETERS) ×3 IMPLANT
CLIP APPLIE ROT 10 11.4 M/L (STAPLE) ×1 IMPLANT
CLOSURE WOUND 1/2 X4 (GAUZE/BANDAGES/DRESSINGS)
COVER MAYO STAND STRL (DRAPES) ×3 IMPLANT
COVER SURGICAL LIGHT HANDLE (MISCELLANEOUS) ×3 IMPLANT
DECANTER SPIKE VIAL GLASS SM (MISCELLANEOUS) ×3 IMPLANT
DERMABOND ADVANCED (GAUZE/BANDAGES/DRESSINGS) ×2
DERMABOND ADVANCED .7 DNX12 (GAUZE/BANDAGES/DRESSINGS) ×1 IMPLANT
DRAPE C-ARM 42X120 X-RAY (DRAPES) ×3 IMPLANT
ELECT PENCIL ROCKER SW 15FT (MISCELLANEOUS) IMPLANT
ELECT REM PT RETURN 15FT ADLT (MISCELLANEOUS) ×3 IMPLANT
GLOVE BIOGEL M 8.0 STRL (GLOVE) ×3 IMPLANT
GOWN STRL REUS W/TWL XL LVL3 (GOWN DISPOSABLE) ×9 IMPLANT
HEMOSTAT SURGICEL 4X8 (HEMOSTASIS) IMPLANT
IV CATH 14GX2 1/4 (CATHETERS) ×3 IMPLANT
KIT BASIN OR (CUSTOM PROCEDURE TRAY) ×3 IMPLANT
L-HOOK LAP DISP 36CM (ELECTROSURGICAL)
LHOOK LAP DISP 36CM (ELECTROSURGICAL) IMPLANT
POUCH RETRIEVAL ECOSAC 10 (ENDOMECHANICALS) ×1 IMPLANT
POUCH RETRIEVAL ECOSAC 10MM (ENDOMECHANICALS) ×2
SCISSORS LAP 5X45 EPIX DISP (ENDOMECHANICALS) ×3 IMPLANT
SET IRRIG TUBING LAPAROSCOPIC (IRRIGATION / IRRIGATOR) ×3 IMPLANT
SLEEVE XCEL OPT CAN 5 100 (ENDOMECHANICALS) ×6 IMPLANT
STRIP CLOSURE SKIN 1/2X4 (GAUZE/BANDAGES/DRESSINGS) IMPLANT
SUT MNCRL AB 4-0 PS2 18 (SUTURE) ×3 IMPLANT
SYR 20CC LL (SYRINGE) ×3 IMPLANT
TOWEL OR 17X26 10 PK STRL BLUE (TOWEL DISPOSABLE) ×3 IMPLANT
TRAY LAPAROSCOPIC (CUSTOM PROCEDURE TRAY) ×3 IMPLANT
TROCAR BLADELESS OPT 5 100 (ENDOMECHANICALS) ×3 IMPLANT
TROCAR XCEL BLUNT TIP 100MML (ENDOMECHANICALS) IMPLANT
TROCAR XCEL NON-BLD 11X100MML (ENDOMECHANICALS) ×3 IMPLANT
TUBING INSUF HEATED (TUBING) ×3 IMPLANT

## 2017-11-10 NOTE — Transfer of Care (Signed)
Immediate Anesthesia Transfer of Care Note  Patient: Sharon BirksHilda D Granados-Benitez  Procedure(s) Performed: LAPAROSCOPIC CHOLECYSTECTOMY WITH INTRAOPERATIVE CHOLANGIOGRAM (N/A Abdomen)  Patient Location: PACU  Anesthesia Type:General  Level of Consciousness: awake, sedated and responds to stimulation  Airway & Oxygen Therapy: Patient Spontanous Breathing and Patient connected to face mask oxygen  Post-op Assessment: Report given to RN and Post -op Vital signs reviewed and stable  Post vital signs: Reviewed and stable  Last Vitals:  Vitals Value Taken Time  BP 149/90 11/10/2017  5:52 PM  Temp    Pulse 90 11/10/2017  5:53 PM  Resp 19 11/10/2017  5:53 PM  SpO2 100 % 11/10/2017  5:53 PM  Vitals shown include unvalidated device data.  Last Pain:  Vitals:   11/10/17 1533  TempSrc: Oral  PainSc: 4       Patients Stated Pain Goal: 2 (11/10/17 0857)  Complications: No apparent anesthesia complications

## 2017-11-10 NOTE — Anesthesia Postprocedure Evaluation (Signed)
Anesthesia Post Note  Patient: Sharon BirksHilda D Johnson  Procedure(s) Performed: LAPAROSCOPIC CHOLECYSTECTOMY WITH INTRAOPERATIVE CHOLANGIOGRAM (N/A Abdomen)     Patient location during evaluation: PACU Anesthesia Type: General Level of consciousness: awake and alert Pain management: pain level controlled Vital Signs Assessment: post-procedure vital signs reviewed and stable Respiratory status: spontaneous breathing, nonlabored ventilation, respiratory function stable and patient connected to nasal cannula oxygen Cardiovascular status: blood pressure returned to baseline and stable Postop Assessment: no apparent nausea or vomiting Anesthetic complications: no    Last Vitals:  Vitals:   11/10/17 1839 11/10/17 1845  BP:  (!) 157/88  Pulse: 86 80  Resp: 14 16  Temp:    SpO2: 99% 99%    Last Pain:  Vitals:   11/10/17 1839  TempSrc:   PainSc: 8                  Miki Labuda A.

## 2017-11-10 NOTE — Progress Notes (Signed)
[facility] Progress Note  Vassie MomentHilda V Granados-Benitez 42 y.o. 18-Apr-1976  CC:  Abdominal pain, abnormal LFTs   Subjective: Patient continues to have abdominal discomfort but it has improved since admission. Afebrile.   Objective: Vital signs in last 24 hours: Vitals:   11/10/17 0357 11/10/17 0608  BP: 113/75 102/62  Pulse: 69 71  Resp: 17   Temp: 98.1 F (36.7 C) 98.3 F (36.8 C)  SpO2: 98% 100%    Physical Exam:  Gen. Alert and oriented 3, not connected distress Heart. Rate rhythm regular. No murmur Abdomen. Soft, bilateral lower quadrant discomfort on palpation, bowel sounds present, no peritoneal signs   Lab Results: Recent Labs    11/09/17 0407 11/10/17 0606  NA 136 142  K 3.4* 3.9  CL 104 108  CO2 23 24  GLUCOSE 131* 119*  BUN 6 7  CREATININE 0.47 0.49  CALCIUM 8.0* 9.0  MG 1.4* 2.0  PHOS 3.0  --    Recent Labs    11/09/17 0407 11/10/17 0606  AST 291* 59*  ALT 338* 186*  ALKPHOS 105 84  BILITOT 0.9 0.5  PROT 6.6 6.9  ALBUMIN 3.4* 3.6   Recent Labs    11/09/17 0407 11/10/17 0606  WBC 10.1 6.4  NEUTROABS  --  3.3  HGB 11.8* 11.7*  HCT 36.2 36.5  MCV 90.3 90.8  PLT 254 264   No results for input(s): LABPROT, INR in the last 72 hours.    Assessment/Plan: - abdominal pain, nausea and vomiting. Improving. Most likely from acute cholecystitis - Mild elevated AST and ALT. trending down  Normal total bilirubin alkaline phosphatase. Imaging study shows CBD dilatation without any gallbladder or common bile duct stones. HIDA scan shows nonvisualization of the gallbladder indicated with acute cholecystitis. Common bile duct was patent.  Recommendations --------------------------- - Plan for cholecystectomy by surgical team - No further inpatient GI workup indicated. GI will sign off. Call us back if needed   Kathi DerParag Micole Delehanty MD, FACP 11/10/2017, 8:51 AM  Contact #  [phone]

## 2017-11-10 NOTE — Progress Notes (Signed)
Pt. Off unit for surgical procedure

## 2017-11-10 NOTE — Progress Notes (Signed)
[facility] Progress Note  Patient: Sharon Johnson ZOX:[id number]   PCP: Patient, No Pcp Per DOB: [birth year]   DOA: 11/08/2017   DOS: 11/10/2017   Date of Service: the patient was seen and examined on 11/10/2017  Subjective: Feeling better, pain is well controlled.  No nausea no vomiting no fever no chills.  Passing gas and having bowel movements.  Brief hospital course: Pt. with PMH of hypertension; admitted on 11/08/2017, presented with complaint of abdominal pain and fever, was found to have acute cholecystitis.  GI was consulted.  Recommended HIDA scan which was positive.  Scheduled for surgery Currently further plan is for postop course.  Assessment and Plan: 1.  Sepsis due to acute cholecystitis. Afebrile.  Leukocytosis resolved. Blood cultures so far negative.  UA showed red bacteria although urine culture is negative as well. CT abdomen showed gallbladder distention, CBD dilatation and patient had elevated LFTs.  GI was consulted, HIDA scan performed which is positive for acute cholecystitis. General surgery consulted patient scheduled for laparoscopic cholecystectomy today. On IV Zosyn as well as pain medication for now. Initially there was some concern about possible pyelonephritis due to patient's presentation although gallbladder appears to be the primary suspect.  2.  Mild hypokalemia. Resolved.  3.  Hypomagnesemia. Corrected.  4.  Bilateral hydronephrosis CT scan on 10/28/2017 showed bilateral hydronephrosis with a nonobstructing stone. Repeat CT scan on 422 shows no evidence of hydronephrosis.  Renal function stable.  Monitor.  Diet: N.p.o., advance postoperatively per surgery. DVT Prophylaxis: subcutaneous Heparin  Advance goals of care discussion: full code  Family Communication: =family was present at bedside, at the time of interview. The pt provided permission to discuss medical plan with the family. Opportunity was given to ask question and all  questions were answered satisfactorily.   Disposition:  Discharge to home tomorrow.  Consultants: General surgery gastroenterology  Procedures: non  Antibiotics: Anti-infectives (From admission, onward)   Start     Dose/Rate Route Frequency Ordered Stop   11/09/17 0800  piperacillin-tazobactam (ZOSYN) IVPB 3.375 g     3.375 g 12.5 mL/hr over 240 Minutes Intravenous Every 8 hours 11/09/17 0040     11/09/17 0100  piperacillin-tazobactam (ZOSYN) IVPB 3.375 g  Status:  Discontinued     3.375 g 100 mL/hr over 30 Minutes Intravenous  Once 11/09/17 0051 11/09/17 0052   11/09/17 0100  piperacillin-tazobactam (ZOSYN) IVPB 3.375 g     3.375 g 12.5 mL/hr over 240 Minutes Intravenous  Once 11/09/17 0051 11/09/17 0534   11/09/17 0045  piperacillin-tazobactam (ZOSYN) IVPB 3.375 g  Status:  Discontinued     3.375 g 100 mL/hr over 30 Minutes Intravenous  Once 11/09/17 0034 11/09/17 0050   11/08/17 2200  metroNIDAZOLE (FLAGYL) IVPB 500 mg     500 mg 100 mL/hr over 60 Minutes Intravenous  Once 11/08/17 2156 11/08/17 2307   11/08/17 1930  cefTRIAXone (ROCEPHIN) 2 g in sodium chloride 0.9 % 100 mL IVPB     2 g 200 mL/hr over 30 Minutes Intravenous  Once 11/08/17 1924 11/08/17 2104       Objective: Physical Exam: Vitals:   11/10/17 0201 11/10/17 0357 11/10/17 0608 11/10/17 1225  BP: (!) 93/57 113/75 102/62 (!) 126/46  Pulse: 87 69 71 66  Resp: 19 17  16   Temp: 98.7 F (37.1 C) 98.1 F (36.7 C) 98.3 F (36.8 C) 98.6 F (37 C)  TempSrc: Oral Oral Oral Oral  SpO2: 97% 98% 100% 100%  Weight:  Height:        Intake/Output Summary (Last 24 hours) at 11/10/2017 1308 Last data filed at 11/10/2017 0910 Gross per 24 hour  Intake 1473.33 ml  Output 1900 ml  Net -426.67 ml   Filed Weights   11/09/17 0100  Weight: 77.6 kg (171 lb)   General: Alert, Awake and Oriented to Time, Place and Person. Appear in mild distress, affect appropriate Eyes: PERRL, Conjunctiva normal ENT: Oral Mucosa  clear moist. Neck: no JVD, no Abnormal Mass Or lumps Cardiovascular: S1 and S2 Present, no Murmur, Peripheral Pulses Present Respiratory: normal respiratory effort, Bilateral Air entry equal and Decreased, no use of accessory muscle, Clear to Auscultation, no Crackles, no wheezes Abdomen: Bowel Sound present, Soft and mild tenderness, no hernia Skin: no redness, no Rash, no induration Extremities: no Pedal edema, no calf tenderness Neurologic: Grossly no focal neuro deficit. Bilaterally Equal motor strength  Data Reviewed: CBC: Recent Labs  Lab 11/05/17 1129 11/08/17 1630 11/09/17 0407 11/10/17 0606  WBC 6.5 12.9* 10.1 6.4  NEUTROABS 2.5  --   --  3.3  HGB 13.2 13.9 11.8* 11.7*  HCT 39.6 41.0 36.2 36.5  MCV 89 88.7 90.3 90.8  PLT 358 273 254 264   Basic Metabolic Panel: Recent Labs  Lab 11/05/17 1129 11/08/17 1630 11/09/17 0407 11/10/17 0606  NA 141 135 136 142  K 4.5 3.6 3.4* 3.9  CL 103 103 104 108  CO2 20 21* 23 24  GLUCOSE 86 118* 131* 119*  BUN 8 7 6 7   CREATININE 0.55* 0.36* 0.47 0.49  CALCIUM 9.8 9.2 8.0* 9.0  MG  --   --  1.4* 2.0  PHOS  --   --  3.0  --     Liver Function Tests: Recent Labs  Lab 11/05/17 1129 11/08/17 1630 11/09/17 0407 11/10/17 0606  AST 18 18 291* 59*  ALT 24 26 338* 186*  ALKPHOS 61 59 105 84  BILITOT 0.6 0.5 0.9 0.5  PROT 7.4 7.7 6.6 6.9  ALBUMIN 4.8 4.4 3.4* 3.6   Recent Labs  Lab 11/08/17 1630 11/10/17 0606  LIPASE 29 35   No results for input(s): AMMONIA in the last 168 hours. Coagulation Profile: No results for input(s): INR, PROTIME in the last 168 hours. Cardiac Enzymes: No results for input(s): CKTOTAL, CKMB, CKMBINDEX, TROPONINI in the last 168 hours. BNP (last 3 results) No results for input(s): PROBNP in the last 8760 hours. CBG: No results for input(s): GLUCAP in the last 168 hours. Studies: Nm Hepato W/eject Fract  Result Date: 11/09/2017 CLINICAL DATA:  Gallbladder distention on CT EXAM: NUCLEAR  MEDICINE HEPATOBILIARY IMAGING VIEWS: Anterior right upper quadrant RADIOPHARMACEUTICALS:  5.38 mCi Tc-35m  Choletec IV COMPARISON:  CT abdomen and pelvis November 09, 2014 FINDINGS: Liver uptake of radiotracer is within normal limits. There is prompt visualization of small bowel, indicating patency of the common bile duct. Images were obtained serially over a 60 minutes time span without gallbladder visualization. After 60 minutes, the patient was given 3.1 mg of morphine sulfate with images obtained over an additional 30 minute time span. Despite the morphine augmentation, gallbladder does not visualize. IMPRESSION: Gallbladder does not visualize during the course of the study even utilizing morphine augmentation. This finding is presumptive evidence for cystic duct obstruction, a finding indicative of acute cholecystitis. There is prompt visualization of small bowel indicating patency of the common bile duct. Electronically Signed   By: Bretta Bang III M.D.   On: 11/09/2017 15:35  Scheduled Meds: . Chlorhexidine Gluconate Cloth  6 each Topical Once   And  . Chlorhexidine Gluconate Cloth  6 each Topical Once   Continuous Infusions: . dextrose 5 % and 0.9 % NaCl with KCl 20 mEq/L 100 mL/hr at 11/09/17 1852  . piperacillin-tazobactam (ZOSYN)  IV 3.375 g (11/10/17 0901)   PRN Meds: acetaminophen **OR** acetaminophen, HYDROcodone-acetaminophen, ondansetron **OR** ondansetron (ZOFRAN) IV  Time spent: 35 minutes  Author: Lynden Oxford, MD [facility] Pager: [phone] 11/10/2017 1:08 PM  If 7PM-7AM, please contact night-coverage at [email or web address], password [facility]

## 2017-11-10 NOTE — Discharge Instructions (Addendum)
Please arrive at least 30 min before your appointment to complete your check in paperwork.  If you are unable to arrive 30 min prior to your appointment time we may have to cancel or reschedule you. ° °LAPAROSCOPIC SURGERY: POST OP INSTRUCTIONS  °1. DIET: Follow a light bland diet the first 24 hours after arrival home, such as soup, liquids, crackers, etc. Be sure to include lots of fluids daily. Avoid fast food or heavy meals as your are more likely to get nauseated. Eat a low fat the next few days after surgery.  °2. Take your usually prescribed home medications unless otherwise directed. °3. PAIN CONTROL:  °1. Pain is best controlled by a usual combination of three different methods TOGETHER:  °1. Ice/Heat °2. Over the counter pain medication °3. Prescription pain medication °2. Most patients will experience some swelling and bruising around the incisions. Ice packs or heating pads (30-60 minutes up to 6 times a day) will help. Use ice for the first few days to help decrease swelling and bruising, then switch to heat to help relax tight/sore spots and speed recovery. Some people prefer to use ice alone, heat alone, alternating between ice & heat. Experiment to what works for you. Swelling and bruising can take several weeks to resolve.  °3. It is helpful to take an over-the-counter pain medication regularly for the first few weeks. Choose one of the following that works best for you:  °1. Naproxen (Aleve, etc) Two 220mg tabs twice a day °2. Ibuprofen (Advil, etc) Three 200mg tabs four times a day (every meal & bedtime) °3. Acetaminophen (Tylenol, etc) 500-650mg four times a day (every meal & bedtime) °4. A prescription for pain medication (such as oxycodone, hydrocodone, etc) should be given to you upon discharge. Take your pain medication as prescribed.  °1. If you are having problems/concerns with the prescription medicine (does not control pain, nausea, vomiting, rash, itching, etc), please call us [phone] to see if we need to switch you to a different pain medicine that will work better for you and/or control your side effect better. °2. If you need a refill on your pain medication, please contact your pharmacy. They will contact our office to request authorization. Prescriptions will not be filled after 5 pm or on week-ends. °4. Avoid getting constipated. Between the surgery and the pain medications, it is common to experience some constipation. Increasing fluid intake and taking a fiber supplement (such as Metamucil, Citrucel, FiberCon, MiraLax, etc) 1-2 times a day regularly will usually help prevent this problem from occurring. A mild laxative (prune juice, Milk of Magnesia, MiraLax, etc) should be taken according to package directions if there are no bowel movements after 48 hours.  °5. Watch out for diarrhea. If you have many loose bowel movements, simplify your diet to bland foods & liquids for a few days. Stop any stool softeners and decrease your fiber supplement. Switching to mild anti-diarrheal medications (Kayopectate, Pepto Bismol) can help. If this worsens or does not improve, please call us. °6. Wash / shower every day. You may shower over the dressings as they are waterproof. Continue to shower over incision(s) after the dressing is off. If there is glue over the incisions try not to pick it off, let it fall off naturally. °7. Remove your waterproof bandages 2 days after surgery. You may leave the incision open to air. You may replace a dressing/Band-Aid to cover the incision for comfort if you wish.  °8. ACTIVITIES as tolerated:  °  1. You may resume regular (light) daily activities beginning the next day--such as daily self-care, walking, climbing stairs--gradually increasing activities as tolerated. If you can walk 30 minutes without difficulty, it is safe to try more intense activity such as jogging, treadmill, bicycling, low-impact aerobics, swimming, etc. 2. Save the most intensive and  strenuous activity for last such as sit-ups, heavy lifting, contact sports, etc Refrain from any heavy lifting or straining until you are off narcotics for pain control. For the first 2-3 weeks do not lift over 10-15lb.  3. DO NOT PUSH THROUGH PAIN. Let pain be your guide: If it hurts to do something, don't do it. Pain is your body warning you to avoid that activity for another week until the pain goes down. 4. You may drive when you are no longer taking prescription pain medication, you can comfortably wear a seatbelt, and you can safely maneuver your car and apply brakes. 5. You may have sexual intercourse when it is comfortable.  9. FOLLOW UP in our office  1. Please call [facility] at [phone] to set up an appointment to see your surgeon in the office for a follow-up appointment approximately 2-3 weeks after your surgery. 2. Make sure that you call for this appointment the day you arrive home to insure a convenient appointment time.      10. IF YOU HAVE DISABILITY OR FAMILY LEAVE FORMS, BRING THEM TO THE               OFFICE FOR PROCESSING.   WHEN TO CALL us [phone]:  1. Poor pain control 2. Reactions / problems with new medications (rash/itching, nausea, etc)  3. Fever over 101.5 F (38.5 C) 4. Inability to urinate 5. Nausea and/or vomiting 6. Worsening swelling or bruising 7. Continued bleeding from incision. 8. Increased pain, redness, or drainage from the incision  The clinic staff is available to answer your questions during regular business hours (8:30am-5pm). Please dont hesitate to call and ask to speak to one of our nurses for clinical concerns.  If you have a medical emergency, go to the nearest emergency room or call 911.  A surgeon from [facility] is always on call at the [facility], [place]  [address], [address], [place], [place] [address] ?  MAIN: [phone] ? TOLL FREE: [phone] ?  FAX [phone]    [email or web address]   Cuidado de sonda urinaria permanente en adultos Indwelling Urinary Catheter Care, Adult Si cuida adecuadamente de su sonda, har que funcione de [place] y Contractor a prevenir que surjan problemas. Cmo usar su sonda Sujete la sonda a su pierna con cinta adhesiva o una correa de pierna. Asegrese de que no haya tensin sobre la sonda. Si [place] Elders, primero quite cualquier residuo [employer] de la cinta que [place] anteriormente. Cmo usar una bolsa de drenaje  Usted debera haber recibido una bolsa de drenaje grande de uso nocturno y [place] bolsa de pierna ms pequea que se coloca debajo de la ropa. La bolsa de uso nocturno puede usarse en cualquier momento, pero la ms pequea nunca debe utilizarse durante la noche.  Mantenga la bolsa de drenaje de uso nocturno por debajo del nivel de la vejiga, pero mantngala separada del suelo. A la hora de dormir, cuelgue la bolsa dentro de una papelera cubierta con una bolsa de plstico limpia.  Use siempre la bolsa de pierna por debajo de su rodilla. Mantenga la bolsa de pierna  fija con una correa de pierna o cinta adhesiva. Cmo cuidar su piel  Limpie la piel que est alrededor de la sonda al menos una vez por da.  Dchese a diario. No tome baos de inmersin.  Aplquese cremas, lociones o ungentos en su zona genital nicamente segn lo indicado por su mdico.  No use talcos, aerosoles ni lociones en la zona genital. Cmo limpiar la sonda y su piel 1. Lvese las manos con agua y [place]. 2. Moje un pao con agua tibia y jabn suave. 3. Use el pao para limpiar la parte de la piel donde la sonda entra en el cuerpo. Limpie con un movimiento hacia abajo, alejndose de la sonda en pequeos crculos. No limpie hacia la sonda. Esto puede hacer que las bacterias ingresen en la uretra y causar una infeccin. 4. Seque bien el rea con una toalla limpia dando golpecitos. Asegrese de quitar todo el [place]. Cmo  cuidar sus bolsas de drenaje Vace la bolsa de drenaje cuando se haya llenado hasta una tercera parte o hasta la mitad, o por lo menos 2 o 3veces al C.H. Robinson Worldwideda. Cambie la bolsa de drenaje una vez por mes o antes, si empieza a tener mal olor o est sucia. No limpie la bolsa de drenaje a menos que as se lo indique su mdico. Cmo vaciar la bolsa de drenaje  Materiales necesarios  Alcohol para fricciones.  Apsito de gasa o bola de algodn.  Cinta adhesiva o correa de pierna.  Pasos 1. Lvese las manos con agua y [place]. 2. Desprenda la bolsa de drenaje de su pierna. 3. Sostenga la bolsa de drenaje sobre el inodoro o un recipiente limpio. Mantenga la bolsa de drenaje por debajo de sus caderas y vejiga. Esto impide que la orina reingrese en la sonda y en su vejiga. 4. Abra el pico vertedor que est en el fondo de la [place]. 5. Vace la orina en el inodoro o recipiente. No permita que el pico vertedor toque ninguna superficie. Esto mantiene a las bacterias fuera de la bolsa y [place] Grenadinesayuda a prevenir infecciones. 6. Coloque alcohol para fricciones en el apsito de gasa o la bola de algodn. 7. Use el apsito de gasa o la bola de algodn para Music therapistlimpiar el pico vertedor. 8. Cierre Technical brewerel pico vertedor. 9. Sujete la bolsa a su pierna con cinta adhesiva o una correa de pierna. 10. Lvese las manos.  Cmo cambiar la bolsa de drenaje Materiales necesarios  Toallitas impregnadas en alcohol.  Una bolsa de drenaje limpia.  Cinta adhesiva o correa de pierna.  Pasos 1. Lvese las manos con agua y [place]. 2. Desprenda la bolsa de drenaje sucia de su pierna. 3. Comprima la sonda de goma con sus dedos para que la orina no se derrame. 4. Desconecte la sonda del tubo de drenaje en la vlvula de conexin. No permita que la sonda o el tubo toque ninguna superficie. 5. Limpie el extremo de la sonda con un pao con alcohol. Use otro pao con alcohol para limpiar el extremo del tubo de drenaje. 6. Conecte la sonda al tubo de  drenaje de la bolsa de drenaje limpia. 7. Sujete la nueva bolsa a su pierna con cinta adhesiva o una correa de pierna. No la ajuste demasiado. 8. Lvese las manos.  Cmo prevenir una infeccin y otros problemas  [place]ca tire de la sonda o trate de [place] Plainsquitarla. Al tirar, podran daarse sus tejidos internos.  Lvese siempre las manos antes y despus de Community education officermanipular la sonda.  Si se moja la correa de pierna, cmbiela por una seca.  Beba suficiente lquido para Photographer orina clara o de color amarillo plido, o segn las indicaciones del mdico.  No permita que la bolsa de drenaje ni la sonda toquen el suelo.  Use ropa interior de algodn. El algodn absorbe la humedad y [place] su piel seca.  Si es [place], higiencese de adelante hacia atrs despus de defecar.  Revise la sonda con frecuencia para asegurarse de que funcione correctamente y que no est enroscada ni doblada. Comunquese con un mdico si:  Su orina es turbia.  Su orina huele inusualmente mal.  Su orina no drena dentro de la bolsa.  La sonda se obstruye.  La sonda empieza a tener filtraciones.  Siente que su vejiga est llena. Solicite ayuda de inmediato si:  Tiene enrojecimiento, hinchazn o dolor donde la sonda ingresa en su cuerpo.  Tiene lquido, pus o mal olor que salen del rea donde la sonda ingresa en su cuerpo.  El rea donde la sonda ingresa en su cuerpo se siente clida al tacto.  Tiene fiebre.  Siente dolor en el abdomen, las piernas, la zona lumbar o la vejiga.  La sonda se llena con sangre.  La orina es de color rosa o rojo.  Tiene nuseas, vmitos o escalofros.  La sonda se ha salido Social worker. Esta informacin no tiene Theme park manager el consejo del mdico. Asegrese de hacerle al mdico cualquier pregunta que tenga. Document Released: 07/06/2005 Document Revised: 06/17/2016 Document Reviewed: 12/19/2013 Elsevier Interactive Patient Education  [employer].

## 2017-11-10 NOTE — Op Note (Signed)
Sharon BirksHilda D Johnson  Primary Care Physician:  Patient, No Pcp Per    11/10/2017  5:32 PM  Procedure: Laparoscopic Cholecystectomy with intraoperative cholangiogram  Surgeon: Susy FrizzleMatt B. Daphine DeutscherMartin, MD, FACS Asst:  none  Anes:  General  Drains:  None  Findings: Acute cholecystitis  Description of Procedure: The patient was taken to OR 4 and given general anesthesia.  The patient was prepped with PCMX and draped sterilely. A time out was performed.  Access to the abdomen was achieved with a 5 mm Optiview through the right upper quadrant.  Port placement included three 5 mm trocars and .    The gallbladder was visualized and the fundus was grasped and the gallbladder was elevated. Traction on the infundibulum allowed for successful demonstration of the critical view. Inflammatory changes were acute.  The cystic duct was identified and clipped up on the gallbladder and an incision was made in the cystic duct and the Reddick catheter was inserted after milking the cystic duct of any debris. A dynamic cholangiogram was performed which demonstrated filling of the CBD and intrahepatic ducts.    The cystic duct was then triple clipped and divided, the cystic artery was double clipped and divided and then the gallbladder was removed from the gallbladder bed. Removal of the gallbladder from the gallbladder bed was without difficutly.  The gallbladder was then placed in a bag and brought out through one of the trocar sites. The gallbladder bed was inspected and no bleeding or bile leaks were seen.   Laparoscopic visualization was used when closing fascial defects for trocar sites.   Incisions were injected with Exparel and closed with 4-0 Monocryl and Exparel on the skin.  Sponge and needle count were correct.    The patient was taken to the recovery room in satisfactory condition.

## 2017-11-10 NOTE — Anesthesia Procedure Notes (Signed)
Procedure Name: Intubation Date/Time: 11/10/2017 4:00 PM Performed by: Elyn PeersAllen, Karrie Fluellen J, CRNA Pre-anesthesia Checklist: Patient identified, Emergency Drugs available, Suction available, Patient being monitored and Timeout performed Patient Re-evaluated:Patient Re-evaluated prior to induction Oxygen Delivery Method: Circle system utilized Preoxygenation: Pre-oxygenation with 100% oxygen Induction Type: IV induction, Rapid sequence and Cricoid Pressure applied Laryngoscope Size: Miller and 3 Grade View: Grade I Tube type: Oral Tube size: 7.0 mm Number of attempts: 1 Airway Equipment and Method: Stylet Placement Confirmation: ETT inserted through vocal cords under direct vision,  positive ETCO2 and breath sounds checked- equal and bilateral Secured at: 21 cm Tube secured with: Tape Dental Injury: Teeth and Oropharynx as per pre-operative assessment

## 2017-11-10 NOTE — Progress Notes (Signed)
[facility] Progress Note     Subjective: CC- abdominal pain Patient states that her abdominal pain is improving, but does still hurt in her lower abdomen and bilateral flanks. No n/v this morning. No fever last 24 hours.   HIDA yesterday showed acute cholecystitis, ready for surgery today.  Objective: Vital signs in last 24 hours: Temp:  [98.1 F (36.7 C)-98.9 F (37.2 C)] 98.3 F (36.8 C) (04/24 0608) Pulse Rate:  [69-87] 71 (04/24 0608) Resp:  [14-19] 17 (04/24 0357) BP: (93-127)/(57-75) 102/62 (04/24 0608) SpO2:  [95 %-100 %] 100 % (04/24 0608) Last BM Date: 11/08/17  Intake/Output from previous day: 04/23 0701 - 04/24 0700 In: 2423.3 [P.O.:360; I.V.:1813.3; IV Piggyback:250] Out: 2400 [Urine:2400] Intake/Output this shift: No intake/output data recorded.  PE: Gen:  Alert, NAD, pleasant HEENT: EOM's intact, pupils equal and round Card:  RRR, no M/G/R heard Pulm:  CTAB, no W/R/R, effort normal Abd: Soft, ND, mild lower abdominal TTP without guarding or rebound, no upper abdominal TTP, +BS, no HSM, no hernia Ext:  Calves soft and nontender Psych: A&Ox3  Skin: no rashes noted, warm and dry  Lab Results:  Recent Labs    11/09/17 0407 11/10/17 0606  WBC 10.1 6.4  HGB 11.8* 11.7*  HCT 36.2 36.5  PLT 254 264   BMET Recent Labs    11/09/17 0407 11/10/17 0606  NA 136 142  K 3.4* 3.9  CL 104 108  CO2 23 24  GLUCOSE 131* 119*  BUN 6 7  CREATININE 0.47 0.49  CALCIUM 8.0* 9.0   PT/INR No results for input(s): LABPROT, INR in the last 72 hours. CMP     Component Value Date/Time   NA 142 11/10/2017 0606   NA 141 11/05/2017 1129   K 3.9 11/10/2017 0606   CL 108 11/10/2017 0606   CO2 24 11/10/2017 0606   GLUCOSE 119 (H) 11/10/2017 0606   BUN 7 11/10/2017 0606   BUN 8 11/05/2017 1129   CREATININE 0.49 11/10/2017 0606   CALCIUM 9.0 11/10/2017 0606   PROT 6.9 11/10/2017 0606   PROT 7.4 11/05/2017 1129   ALBUMIN 3.6 11/10/2017 0606   ALBUMIN 4.8 11/05/2017 1129   AST 59 (H) 11/10/2017 0606   ALT 186 (H) 11/10/2017 0606   ALKPHOS 84 11/10/2017 0606   BILITOT 0.5 11/10/2017 0606   BILITOT 0.6 11/05/2017 1129   GFRNONAA >60 11/10/2017 0606   GFRAA >60 11/10/2017 0606   Lipase     Component Value Date/Time   LIPASE 35 11/10/2017 0606       Studies/Results: Dg Chest 2 View  Result Date: 11/08/2017 CLINICAL DATA:  Continued back and abdominal pain with fever. Pain is worsened since yesterday. EXAM: CHEST - 2 VIEW COMPARISON:  CT abdomen and pelvis from earlier on the same day. FINDINGS: The heart size and mediastinal contours are within normal limits. Both lungs are clear. Mild eventration of the right hemidiaphragm. Atelectasis posteriorly at the lung bases as seen on the same day CT likely accounts for the slightly irregular appearance of the posterior costophrenic angle. No pneumothorax or effusion. No acute osseous appearing abnormality. Degenerative changes are noted along the midthoracic spine. IMPRESSION: No active cardiopulmonary disease. Electronically Signed   By: Ashley Royalty M.D.   On: 11/08/2017 23:39   Ct Abdomen Pelvis W Contrast  Result Date: 11/08/2017 CLINICAL DATA:  Lower abdominal and back pain for the past 10 days. EXAM: CT ABDOMEN AND PELVIS WITH CONTRAST TECHNIQUE: Multidetector CT imaging of  the abdomen and pelvis was performed using the standard protocol following bolus administration of intravenous contrast. CONTRAST:  159m ISOVUE-300 IOPAMIDOL (ISOVUE-300) INJECTION 61% COMPARISON:  CT abdomen pelvis dated October 28, 2017. FINDINGS: Lower chest: No acute abnormality. Hepatobiliary: No focal liver abnormality. Persistent gallbladder distention and mild common bile duct dilatation to 9 mm, similar to prior study. No gallstones or gallbladder wall thickening. Pancreas: Unremarkable. No pancreatic ductal dilatation or surrounding inflammatory changes. Spleen: Normal in size without focal abnormality.  Adrenals/Urinary Tract: The adrenal glands are unremarkable. Resolved bilateral hydroureteronephrosis. Bilateral nonobstructing renal calculi again noted. No ureteral calculi. The bladder is unremarkable. Stomach/Bowel: Stomach is within normal limits. Appendix appears normal. No evidence of bowel wall thickening, distention, or inflammatory changes. Vascular/Lymphatic: No significant vascular findings are present. No enlarged abdominal or pelvic lymph nodes. Reproductive: IUD appropriately positioned within the uterus. No adnexal mass. Other: No free fluid or pneumoperitoneum. Musculoskeletal: No acute or significant osseous findings. IMPRESSION: 1. Unchanged gallbladder distention and mild common bile duct dilatation without CT evidence of cholelithiasis or choledocholithiasis. 2. Resolved bilateral hydroureteronephrosis. 3. Unchanged bilateral nonobstructive nephrolithiasis. Electronically Signed   By: WTitus DubinM.D.   On: 11/08/2017 21:43   Nm Hepato W/eject Fract  Result Date: 11/09/2017 CLINICAL DATA:  Gallbladder distention on CT EXAM: NUCLEAR MEDICINE HEPATOBILIARY IMAGING VIEWS: Anterior right upper quadrant RADIOPHARMACEUTICALS:  5.38 mCi Tc-956mCholetec IV COMPARISON:  CT abdomen and pelvis November 09, 2014 FINDINGS: Liver uptake of radiotracer is within normal limits. There is prompt visualization of small bowel, indicating patency of the common bile duct. Images were obtained serially over a 60 minutes time span without gallbladder visualization. After 60 minutes, the patient was given 3.1 mg of morphine sulfate with images obtained over an additional 30 minute time span. Despite the morphine augmentation, gallbladder does not visualize. IMPRESSION: Gallbladder does not visualize during the course of the study even utilizing morphine augmentation. This finding is presumptive evidence for cystic duct obstruction, a finding indicative of acute cholecystitis. There is prompt visualization of small  bowel indicating patency of the common bile duct. Electronically Signed   By: WiLowella GripII M.D.   On: 11/09/2017 15:35    Anti-infectives: Anti-infectives (From admission, onward)   Start     Dose/Rate Route Frequency Ordered Stop   11/09/17 0800  piperacillin-tazobactam (ZOSYN) IVPB 3.375 g     3.375 g 12.5 mL/hr over 240 Minutes Intravenous Every 8 hours 11/09/17 0040     11/09/17 0100  piperacillin-tazobactam (ZOSYN) IVPB 3.375 g  Status:  Discontinued     3.375 g 100 mL/hr over 30 Minutes Intravenous  Once 11/09/17 0051 11/09/17 0052   11/09/17 0100  piperacillin-tazobactam (ZOSYN) IVPB 3.375 g     3.375 g 12.5 mL/hr over 240 Minutes Intravenous  Once 11/09/17 0051 11/09/17 0534   11/09/17 0045  piperacillin-tazobactam (ZOSYN) IVPB 3.375 g  Status:  Discontinued     3.375 g 100 mL/hr over 30 Minutes Intravenous  Once 11/09/17 0034 11/09/17 0050   11/08/17 2200  metroNIDAZOLE (FLAGYL) IVPB 500 mg     500 mg 100 mL/hr over 60 Minutes Intravenous  Once 11/08/17 2156 11/08/17 2307   11/08/17 1930  cefTRIAXone (ROCEPHIN) 2 g in sodium chloride 0.9 % 100 mL IVPB     2 g 200 mL/hr over 30 Minutes Intravenous  Once 11/08/17 1924 11/08/17 2104       Assessment/Plan Nephrolithiasis - taking flomax Bilateral hydroureteronephrosis - resolved on CT 4/22  Acute cholecystitis -  CT scan 4/22 showed gallbladder distention and mild common bile duct dilatation without CT evidence of cholelithiasis or choledocholithiasis - u/s 4/11 showed cholelithiasis with no sonographic features to suggest acute cholecystitis; common bile duct measures at the upper limits of normal at approximately 6 mm; no choledocholithiasis - HIDA 4/23 showed nonvisualization of the gallbladder indicated with acute cholecystitis, common bile duct was paten - this AM LFTs trending down AST 59 and ALT 186, alk phos and bilirubin WNL  ID - zosyn 4/23>> FEN - IVF, NPO  VTE - SCDs Foley - none  Plan - Plan  for laparoscopic cholecystectomy later today. Keep NPO and continue IVF and antibiotics.    LOS: 2 days    Wellington Hampshire , [facility] Surgery 11/10/2017, 9:13 AM Pager: [phone] Consults: [phone] Mon-Fri 7:00 am-4:30 pm Sat-Sun 7:00 am-11:30 am

## 2017-11-10 NOTE — Anesthesia Preprocedure Evaluation (Addendum)
Anesthesia Evaluation  Patient identified by MRN, date of birth, ID band Patient awake    Reviewed: Allergy & Precautions, NPO status , Patient's Chart, lab work & pertinent test results, reviewed documented beta blocker date and time   Airway Mallampati: III  TM Distance: >3 FB Neck ROM: Full    Dental no notable dental hx. (+) Teeth Intact   Pulmonary neg pulmonary ROS,    Pulmonary exam normal breath sounds clear to auscultation       Cardiovascular hypertension, Normal cardiovascular exam Rhythm:Regular Rate:Normal     Neuro/Psych  Headaches,    GI/Hepatic Neg liver ROS, Cholelithiasis with acute cholecystitis   Endo/Other  Obesity  Renal/GU negative Renal ROS  negative genitourinary   Musculoskeletal negative musculoskeletal ROS (+)   Abdominal (+) + obese,   Peds  Hematology negative hematology ROS (+)   Anesthesia Other Findings   Reproductive/Obstetrics                            Anesthesia Physical Anesthesia Plan  ASA: II  Anesthesia Plan: General   Post-op Pain Management:    Induction: Intravenous, Rapid sequence and Cricoid pressure planned  PONV Risk Score and Plan: 4 or greater and Scopolamine patch - Pre-op, Midazolam, Dexamethasone, Ondansetron and Treatment may vary due to age or medical condition  Airway Management Planned: Oral ETT  Additional Equipment:   Intra-op Plan:   Post-operative Plan: Extubation in OR  Informed Consent: I have reviewed the patients History and Physical, chart, labs and discussed the procedure including the risks, benefits and alternatives for the proposed anesthesia with the patient or authorized representative who has indicated his/her understanding and acceptance.   Dental advisory given  Plan Discussed with: CRNA, Anesthesiologist and Surgeon  Anesthesia Plan Comments:         Anesthesia Quick Evaluation

## 2017-11-11 ENCOUNTER — Encounter (HOSPITAL_COMMUNITY): Payer: Self-pay | Admitting: Surgery

## 2017-11-11 LAB — COMPREHENSIVE METABOLIC PANEL
ALBUMIN: 3.6 g/dL (ref 3.5–5.0)
ALK PHOS: 127 U/L — AB (ref 38–126)
ALT: 180 U/L — ABNORMAL HIGH (ref 14–54)
ANION GAP: 7 (ref 5–15)
AST: 88 U/L — ABNORMAL HIGH (ref 15–41)
BUN: 7 mg/dL (ref 6–20)
CALCIUM: 8.8 mg/dL — AB (ref 8.9–10.3)
CHLORIDE: 106 mmol/L (ref 101–111)
CO2: 24 mmol/L (ref 22–32)
Creatinine, Ser: 0.45 mg/dL (ref 0.44–1.00)
GFR calc non Af Amer: >60 mL/min (ref >60)
GLUCOSE: 133 mg/dL — AB (ref 65–99)
POTASSIUM: 4.4 mmol/L (ref 3.5–5.1)
SODIUM: 137 mmol/L (ref 135–145)
Total Bilirubin: 0.6 mg/dL (ref 0.3–1.2)
Total Protein: 6.9 g/dL (ref 6.5–8.1)

## 2017-11-11 LAB — CBC WITH DIFFERENTIAL/PLATELET
Basophils Absolute: 0 10*3/uL (ref 0.0–0.1)
Basophils Relative: 0 %
Eosinophils Absolute: 0 10*3/uL (ref 0.0–0.7)
Eosinophils Relative: 0 %
HEMATOCRIT: 38.3 % (ref 36.0–46.0)
HEMOGLOBIN: 12.4 g/dL (ref 12.0–15.0)
LYMPHS ABS: 1.2 10*3/uL (ref 0.7–4.0)
LYMPHS PCT: 13 %
MCH: 29.2 pg (ref 26.0–34.0)
MCHC: 32.4 g/dL (ref 30.0–36.0)
MCV: 90.3 fL (ref 78.0–100.0)
MONO ABS: 0.5 10*3/uL (ref 0.1–1.0)
Monocytes Relative: 5 %
NEUTROS PCT: 82 %
Neutro Abs: 7.6 10*3/uL (ref 1.7–7.7)
Platelets: 335 10*3/uL (ref 150–400)
RBC: 4.24 MIL/uL (ref 3.87–5.11)
RDW: 13.6 % (ref 11.5–15.5)
WBC: 9.3 10*3/uL (ref 4.0–10.5)

## 2017-11-11 MED ORDER — METHOCARBAMOL 500 MG PO TABS
500.0000 mg | ORAL_TABLET | Freq: Three times a day (TID) | ORAL | Status: DC | PRN
  Administered 2017-11-13: 500 mg via ORAL
  Filled 2017-11-11: qty 1

## 2017-11-11 MED ORDER — OXYCODONE HCL 5 MG PO TABS
5.0000 mg | ORAL_TABLET | ORAL | Status: DC | PRN
  Administered 2017-11-11 (×2): 10 mg via ORAL
  Administered 2017-11-12: 5 mg via ORAL
  Filled 2017-11-11: qty 2
  Filled 2017-11-11: qty 1
  Filled 2017-11-11: qty 2

## 2017-11-11 MED ORDER — KETOROLAC TROMETHAMINE 15 MG/ML IJ SOLN
15.0000 mg | Freq: Four times a day (QID) | INTRAMUSCULAR | Status: DC
  Administered 2017-11-11 – 2017-11-13 (×8): 15 mg via INTRAVENOUS
  Filled 2017-11-11 (×8): qty 1

## 2017-11-11 MED ORDER — ACETAMINOPHEN 650 MG RE SUPP: 650.0000 mg | Freq: Three times a day (TID) | RECTAL | Status: DC

## 2017-11-11 MED ORDER — OXYCODONE HCL 5 MG PO TABS: 5.0000 mg | ORAL_TABLET | Freq: Once | ORAL | Status: DC

## 2017-11-11 MED ORDER — OXYCODONE HCL 5 MG PO TABS: 5.0000 mg | ORAL_TABLET | Freq: Four times a day (QID) | ORAL | 0 refills | Status: DC | PRN

## 2017-11-11 MED ORDER — ACETAMINOPHEN 500 MG PO TABS
1000.0000 mg | ORAL_TABLET | Freq: Three times a day (TID) | ORAL | Status: DC
  Administered 2017-11-11 – 2017-11-13 (×7): 1000 mg via ORAL
  Filled 2017-11-11 (×7): qty 2

## 2017-11-11 MED ORDER — GUAIFENESIN ER 600 MG PO TB12
600.0000 mg | ORAL_TABLET | Freq: Once | ORAL | Status: AC
  Administered 2017-11-11: 600 mg via ORAL
  Filled 2017-11-11: qty 1

## 2017-11-11 NOTE — Progress Notes (Signed)
[facility] Progress Note  1 Day Post-Op  Subjective: CC- RUQ pain Patient states that her abdomen is very sore this morning, pain is different than prior to surgery. Pain is mostly RUQ. Constant but worse with palpation or deep inspiration. Denies n/v. Tolerating liquids. She has ambulated to the restroom with no issues.  Objective: Vital signs in last 24 hours: Temp:  [98.4 F (36.9 C)-99.4 F (37.4 C)] 98.4 F (36.9 C) (04/25 0539) Pulse Rate:  [63-97] 67 (04/25 0539) Resp:  [14-20] 16 (04/25 0539) BP: (126-163)/(46-100) 148/83 (04/25 0539) SpO2:  [95 %-100 %] 99 % (04/25 0539) Weight:  [171 lb (77.6 kg)] 171 lb (77.6 kg) (04/24 1533) Last BM Date: 11/08/17  Intake/Output from previous day: 04/24 0701 - 04/25 0700 In: 3032 [P.O.:315; I.V.:2517; IV Piggyback:200] Out: 860 [Urine:850; Blood:10] Intake/Output this shift: No intake/output data recorded.  PE: Gen:  Alert, NAD, pleasant HEENT: EOM's intact, pupils equal and round Pulm:  effort normal Abd: Soft, ND, appropriately tender, +BS, lap incisions cdi Skin: no rashes noted, warm and dry  Lab Results:  Recent Labs    11/10/17 0606 11/11/17 0533  WBC 6.4 9.3  HGB 11.7* 12.4  HCT 36.5 38.3  PLT 264 335   BMET Recent Labs    11/10/17 0606 11/11/17 0533  NA 142 137  K 3.9 4.4  CL 108 106  CO2 24 24  GLUCOSE 119* 133*  BUN 7 7  CREATININE 0.49 0.45  CALCIUM 9.0 8.8*   PT/INR No results for input(s): LABPROT, INR in the last 72 hours. CMP     Component Value Date/Time   NA 137 11/11/2017 0533   NA 141 11/05/2017 1129   K 4.4 11/11/2017 0533   CL 106 11/11/2017 0533   CO2 24 11/11/2017 0533   GLUCOSE 133 (H) 11/11/2017 0533   BUN 7 11/11/2017 0533   BUN 8 11/05/2017 1129   CREATININE 0.45 11/11/2017 0533   CALCIUM 8.8 (L) 11/11/2017 0533   PROT 6.9 11/11/2017 0533   PROT 7.4 11/05/2017 1129   ALBUMIN 3.6 11/11/2017 0533   ALBUMIN 4.8 11/05/2017 1129   AST 88 (H) 11/11/2017 0533    ALT 180 (H) 11/11/2017 0533   ALKPHOS 127 (H) 11/11/2017 0533   BILITOT 0.6 11/11/2017 0533   BILITOT 0.6 11/05/2017 1129   GFRNONAA >60 11/11/2017 0533   GFRAA >60 11/11/2017 0533   Lipase     Component Value Date/Time   LIPASE 35 11/10/2017 0606       Studies/Results: Dg Cholangiogram Operative  Result Date: 11/10/2017 CLINICAL DATA:  Cholelithiasis, abdominal pain EXAM: INTRAOPERATIVE CHOLANGIOGRAM TECHNIQUE: Cholangiographic images from the C-arm fluoroscopic device were submitted for interpretation post-operatively. Please see the procedural report for the amount of contrast and the fluoroscopy time utilized. COMPARISON:  11/09/2017 FINDINGS: Intraoperative cholangiogram performed during the laparoscopic procedure. The biliary confluence, common hepatic duct, and common bile duct are mildly dilated with eventual drainage into the duodenum. No significant filling defect, obstruction, or stricture. IMPRESSION: Patent biliary system.  Mild nonspecific dilatation. Electronically Signed   By: Judie PetitM.  Shick M.D.   On: 11/10/2017 19:08   Nm Hepato W/eject Fract  Result Date: 11/09/2017 CLINICAL DATA:  Gallbladder distention on CT EXAM: NUCLEAR MEDICINE HEPATOBILIARY IMAGING VIEWS: Anterior right upper quadrant RADIOPHARMACEUTICALS:  5.38 mCi Tc-6820m  Choletec IV COMPARISON:  CT abdomen and pelvis November 09, 2014 FINDINGS: Liver uptake of radiotracer is within normal limits. There is prompt visualization of small bowel, indicating patency of the  common bile duct. Images were obtained serially over a 60 minutes time span without gallbladder visualization. After 60 minutes, the patient was given 3.1 mg of morphine sulfate with images obtained over an additional 30 minute time span. Despite the morphine augmentation, gallbladder does not visualize. IMPRESSION: Gallbladder does not visualize during the course of the study even utilizing morphine augmentation. This finding is presumptive evidence for  cystic duct obstruction, a finding indicative of acute cholecystitis. There is prompt visualization of small bowel indicating patency of the common bile duct. Electronically Signed   By: Bretta Bang III M.D.   On: 11/09/2017 15:35    Anti-infectives: Anti-infectives (From admission, onward)   Start     Dose/Rate Route Frequency Ordered Stop   11/09/17 0800  piperacillin-tazobactam (ZOSYN) IVPB 3.375 g     3.375 g 12.5 mL/hr over 240 Minutes Intravenous Every 8 hours 11/09/17 0040     11/09/17 0100  piperacillin-tazobactam (ZOSYN) IVPB 3.375 g  Status:  Discontinued     3.375 g 100 mL/hr over 30 Minutes Intravenous  Once 11/09/17 0051 11/09/17 0052   11/09/17 0100  piperacillin-tazobactam (ZOSYN) IVPB 3.375 g     3.375 g 12.5 mL/hr over 240 Minutes Intravenous  Once 11/09/17 0051 11/09/17 0534   11/09/17 0045  piperacillin-tazobactam (ZOSYN) IVPB 3.375 g  Status:  Discontinued     3.375 g 100 mL/hr over 30 Minutes Intravenous  Once 11/09/17 0034 11/09/17 0050   11/08/17 2200  metroNIDAZOLE (FLAGYL) IVPB 500 mg     500 mg 100 mL/hr over 60 Minutes Intravenous  Once 11/08/17 2156 11/08/17 2307   11/08/17 1930  cefTRIAXone (ROCEPHIN) 2 g in sodium chloride 0.9 % 100 mL IVPB     2 g 200 mL/hr over 30 Minutes Intravenous  Once 11/08/17 1924 11/08/17 2104       Assessment/Plan Nephrolithiasis - taking flomax Bilateral hydroureteronephrosis- resolved on CT 4/22  Acute cholecystitis S/p laparoscopic cholecystectomy with IOC 4/24 Dr. Sallyanne Havers - POD 1 - IOC negative  ID -zosyn 4/23>> FEN -regular diet VTE -SCDs, heparin Foley -none Follow up - [facility]  Plan- Schdule tylenol and toradol for better pain control, add robaxin PRN. Advance to regular diet.  If pain is controlled and patient tolerating diet she may be discharged from surgical standpoint. Pain med rx and work note on chart, discharge instructions and f/u info on AVS. She does not need any more antibiotics  from surgical standpoint.   LOS: 3 days    Franne Forts , [facility] Surgery 11/11/2017, 9:19 AM Pager: [phone] Consults: [phone] Mon-Fri 7:00 am-4:30 pm Sat-Sun 7:00 am-11:30 am

## 2017-11-11 NOTE — Progress Notes (Signed)
[facility] Progress Note  Patient: Sharon Johnson ZOX:[id number]   PCP: Patient, No Pcp Per DOB: 17-Nov-1975   DOA: 11/08/2017   DOS: 11/11/2017   Date of Service: the patient was seen and examined on 11/11/2017  Subjective: Pain was not controlled on current regimen.  Patient also having difficulty voiding.  No nausea no vomiting.  Passing gas.  Brief hospital course: Pt. with PMH of hypertension; admitted on 11/08/2017, presented with complaint of abdominal pain and fever, was found to have acute cholecystitis.  GI was consulted.  Recommended HIDA scan which was positive.  Scheduled for surgery Currently further plan is for postop course.  Assessment and Plan: 1.  Sepsis due to acute cholecystitis. Afebrile.  Leukocytosis resolved. Blood cultures so far negative.  UA showed red bacteria although urine culture is negative as well. CT abdomen showed gallbladder distention, CBD dilatation and patient had elevated LFTs.  GI was consulted, HIDA scan performed which is positive for acute cholecystitis. General surgery consulted patient scheduled for laparoscopic cholecystectomy. On IV Zosyn as well as pain medication for now.  Discontinue Zosyn for now. Pain control per surgery, appreciate their input.  Initially there was some concern about possible pyelonephritis due to patient's presentation although gallbladder appears to be the primary suspect.  2.  Postoperative Acute urinary retention,  will plan in and out catheter for now.  3.  Hypomagnesemia. Corrected.  4.  Bilateral hydronephrosis CT scan on 10/28/2017 showed bilateral hydronephrosis with a nonobstructing stone. Repeat CT scan on 4/22 shows no evidence of hydronephrosis.  Renal function stable.  Monitor.  Diet: Regular diet DVT Prophylaxis: subcutaneous Heparin  Advance goals of care discussion: full code  Family Communication: family was present at bedside, at the time of interview. The pt provided permission  to discuss medical plan with the family. Opportunity was given to ask question and all questions were answered satisfactorily.   Disposition:  Discharge to home tomorrow.  Consultants: General surgery gastroenterology  Procedures: Laparoscopic cholecystectomy  Antibiotics: Anti-infectives (From admission, onward)   Start     Dose/Rate Route Frequency Ordered Stop   11/09/17 0800  piperacillin-tazobactam (ZOSYN) IVPB 3.375 g  Status:  Discontinued     3.375 g 12.5 mL/hr over 240 Minutes Intravenous Every 8 hours 11/09/17 0040 11/11/17 1205   11/09/17 0100  piperacillin-tazobactam (ZOSYN) IVPB 3.375 g  Status:  Discontinued     3.375 g 100 mL/hr over 30 Minutes Intravenous  Once 11/09/17 0051 11/09/17 0052   11/09/17 0100  piperacillin-tazobactam (ZOSYN) IVPB 3.375 g     3.375 g 12.5 mL/hr over 240 Minutes Intravenous  Once 11/09/17 0051 11/09/17 0534   11/09/17 0045  piperacillin-tazobactam (ZOSYN) IVPB 3.375 g  Status:  Discontinued     3.375 g 100 mL/hr over 30 Minutes Intravenous  Once 11/09/17 0034 11/09/17 0050   11/08/17 2200  metroNIDAZOLE (FLAGYL) IVPB 500 mg     500 mg 100 mL/hr over 60 Minutes Intravenous  Once 11/08/17 2156 11/08/17 2307   11/08/17 1930  cefTRIAXone (ROCEPHIN) 2 g in sodium chloride 0.9 % 100 mL IVPB     2 g 200 mL/hr over 30 Minutes Intravenous  Once 11/08/17 1924 11/08/17 2104       Objective: Physical Exam: Vitals:   11/11/17 0141 11/11/17 0539 11/11/17 0935 11/11/17 1452  BP: 131/74 (!) 148/83 (!) 119/58 123/67  Pulse: 63 67 67 62  Resp: 18 16 16 14   Temp: 98.5 F (36.9 C) 98.4 F (36.9 C) 98.2 F (  36.8 C) 98.6 F (37 C)  TempSrc: Oral Oral Oral Oral  SpO2: 100% 99% 98% 96%  Weight:      Height:        Intake/Output Summary (Last 24 hours) at 11/11/2017 1642 Last data filed at 11/11/2017 1541 Gross per 24 hour  Intake 4810.16 ml  Output 860 ml  Net 3950.16 ml   Filed Weights   11/09/17 0100 11/10/17 1533  Weight: 77.6 kg (171 lb)  77.6 kg (171 lb)   General: Alert, Awake and Oriented to Time, Place and Person. Appear in mild distress, affect appropriate Eyes: PERRL, Conjunctiva normal ENT: Oral Mucosa clear moist. Neck: no JVD, no Abnormal Mass Or lumps Cardiovascular: S1 and S2 Present, no Murmur, Peripheral Pulses Present Respiratory: normal respiratory effort, Bilateral Air entry equal and Decreased, no use of accessory muscle, Clear to Auscultation, no Crackles, no wheezes Abdomen: Bowel Sound present, Soft and mild tenderness, no hernia Skin: no redness, no Rash, no induration Extremities: no Pedal edema, no calf tenderness Neurologic: Grossly no focal neuro deficit. Bilaterally Equal motor strength  Data Reviewed: CBC: Recent Labs  Lab 11/05/17 1129 11/08/17 1630 11/09/17 0407 11/10/17 0606 11/11/17 0533  WBC 6.5 12.9* 10.1 6.4 9.3  NEUTROABS 2.5  --   --  3.3 7.6  HGB 13.2 13.9 11.8* 11.7* 12.4  HCT 39.6 41.0 36.2 36.5 38.3  MCV 89 88.7 90.3 90.8 90.3  PLT 358 273 254 264 335   Basic Metabolic Panel: Recent Labs  Lab 11/05/17 1129 11/08/17 1630 11/09/17 0407 11/10/17 0606 11/11/17 0533  NA 141 135 136 142 137  K 4.5 3.6 3.4* 3.9 4.4  CL 103 103 104 108 106  CO2 20 21* 23 24 24   GLUCOSE 86 118* 131* 119* 133*  BUN 8 7 6 7 7   CREATININE 0.55* 0.36* 0.47 0.49 0.45  CALCIUM 9.8 9.2 8.0* 9.0 8.8*  MG  --   --  1.4* 2.0  --   PHOS  --   --  3.0  --   --     Liver Function Tests: Recent Labs  Lab 11/05/17 1129 11/08/17 1630 11/09/17 0407 11/10/17 0606 11/11/17 0533  AST 18 18 291* 59* 88*  ALT 24 26 338* 186* 180*  ALKPHOS 61 59 105 84 127*  BILITOT 0.6 0.5 0.9 0.5 0.6  PROT 7.4 7.7 6.6 6.9 6.9  ALBUMIN 4.8 4.4 3.4* 3.6 3.6   Recent Labs  Lab 11/08/17 1630 11/10/17 0606  LIPASE 29 35   No results for input(s): AMMONIA in the last 168 hours. Coagulation Profile: No results for input(s): INR, PROTIME in the last 168 hours. Cardiac Enzymes: No results for input(s):  CKTOTAL, CKMB, CKMBINDEX, TROPONINI in the last 168 hours. BNP (last 3 results) No results for input(s): PROBNP in the last 8760 hours. CBG: No results for input(s): GLUCAP in the last 168 hours. Studies: Dg Cholangiogram Operative  Result Date: 11/10/2017 CLINICAL DATA:  Cholelithiasis, abdominal pain EXAM: INTRAOPERATIVE CHOLANGIOGRAM TECHNIQUE: Cholangiographic images from the C-arm fluoroscopic device were submitted for interpretation post-operatively. Please see the procedural report for the amount of contrast and the fluoroscopy time utilized. COMPARISON:  11/09/2017 FINDINGS: Intraoperative cholangiogram performed during the laparoscopic procedure. The biliary confluence, common hepatic duct, and common bile duct are mildly dilated with eventual drainage into the duodenum. No significant filling defect, obstruction, or stricture. IMPRESSION: Patent biliary system.  Mild nonspecific dilatation. Electronically Signed   By: Judie PetitM.  Shick M.D.   On: 11/10/2017 19:08  Scheduled Meds: . acetaminophen  1,000 mg Oral TID   Or  . acetaminophen  650 mg Rectal TID  . Chlorhexidine Gluconate Cloth  6 each Topical Once  . heparin injection (subcutaneous)  5,000 Units Subcutaneous Q8H  . ketorolac  15 mg Intravenous Q6H   Continuous Infusions:  PRN Meds: methocarbamol, ondansetron **OR** ondansetron (ZOFRAN) IV, oxyCODONE  Time spent: 35 minutes  Author: Lynden Oxford, MD [facility] Pager: [phone] 11/11/2017 4:42 PM  If 7PM-7AM, please contact night-coverage at [email or web address], password [facility]

## 2017-11-12 ENCOUNTER — Ambulatory Visit: Payer: Self-pay | Admitting: Family Medicine

## 2017-11-12 DIAGNOSIS — K810 Acute cholecystitis: Secondary | ICD-10-CM

## 2017-11-12 LAB — URINALYSIS, ROUTINE W REFLEX MICROSCOPIC
BILIRUBIN URINE: NEGATIVE
Glucose, UA: NEGATIVE mg/dL
Ketones, ur: NEGATIVE mg/dL
Leukocytes, UA: NEGATIVE
Nitrite: NEGATIVE
PH: 7 (ref 5.0–8.0)
Protein, ur: NEGATIVE mg/dL
SPECIFIC GRAVITY, URINE: 1.004 — AB (ref 1.005–1.030)

## 2017-11-12 LAB — CBC WITH DIFFERENTIAL/PLATELET
Basophils Absolute: 0 10*3/uL (ref 0.0–0.1)
Basophils Relative: 0 %
EOS ABS: 0.1 10*3/uL (ref 0.0–0.7)
Eosinophils Relative: 1 %
HCT: 35.6 % — ABNORMAL LOW (ref 36.0–46.0)
Hemoglobin: 11.4 g/dL — ABNORMAL LOW (ref 12.0–15.0)
Lymphocytes Relative: 55 %
Lymphs Abs: 4.3 10*3/uL — ABNORMAL HIGH (ref 0.7–4.0)
MCH: 29.2 pg (ref 26.0–34.0)
MCHC: 32 g/dL (ref 30.0–36.0)
MCV: 91.3 fL (ref 78.0–100.0)
MONO ABS: 0.6 10*3/uL (ref 0.1–1.0)
MONOS PCT: 8 %
Neutro Abs: 2.7 10*3/uL (ref 1.7–7.7)
Neutrophils Relative %: 36 %
Platelets: 320 10*3/uL (ref 150–400)
RBC: 3.9 MIL/uL (ref 3.87–5.11)
RDW: 13.8 % (ref 11.5–15.5)
WBC: 7.7 10*3/uL (ref 4.0–10.5)

## 2017-11-12 LAB — COMPREHENSIVE METABOLIC PANEL
ALBUMIN: 3.6 g/dL (ref 3.5–5.0)
ALK PHOS: 99 U/L (ref 38–126)
ALT: 169 U/L — AB (ref 14–54)
AST: 61 U/L — ABNORMAL HIGH (ref 15–41)
Anion gap: 10 (ref 5–15)
BUN: 12 mg/dL (ref 6–20)
CALCIUM: 9.3 mg/dL (ref 8.9–10.3)
CO2: 26 mmol/L (ref 22–32)
CREATININE: 0.47 mg/dL (ref 0.44–1.00)
Chloride: 105 mmol/L (ref 101–111)
GFR calc Af Amer: >60 mL/min (ref >60)
GFR calc non Af Amer: >60 mL/min (ref >60)
GLUCOSE: 89 mg/dL (ref 65–99)
Potassium: 4 mmol/L (ref 3.5–5.1)
Sodium: 141 mmol/L (ref 135–145)
Total Bilirubin: 0.5 mg/dL (ref 0.3–1.2)
Total Protein: 6.9 g/dL (ref 6.5–8.1)

## 2017-11-12 MED ORDER — POLYETHYLENE GLYCOL 3350 17 G PO PACK
17.0000 g | PACK | Freq: Every day | ORAL | Status: DC
Start: 2017-11-12 — End: 2017-11-13
  Administered 2017-11-12 – 2017-11-13 (×2): 17 g via ORAL
  Filled 2017-11-12 (×2): qty 1

## 2017-11-12 MED ORDER — METHOCARBAMOL 500 MG PO TABS: 500.0000 mg | ORAL_TABLET | Freq: Three times a day (TID) | ORAL | 0 refills | Status: DC | PRN

## 2017-11-12 MED ORDER — TAMSULOSIN HCL 0.4 MG PO CAPS
0.4000 mg | ORAL_CAPSULE | Freq: Every day | ORAL | Status: DC
  Administered 2017-11-12 – 2017-11-13 (×2): 0.4 mg via ORAL
  Filled 2017-11-12 (×2): qty 1

## 2017-11-12 MED ORDER — DOCUSATE SODIUM 100 MG PO CAPS
100.0000 mg | ORAL_CAPSULE | Freq: Two times a day (BID) | ORAL | Status: DC
  Administered 2017-11-12 – 2017-11-13 (×3): 100 mg via ORAL
  Filled 2017-11-12 (×3): qty 1

## 2017-11-12 NOTE — Progress Notes (Signed)
[facility] Progress Note  Patient: Sharon Johnson:[id number]   PCP: Patient, No Pcp Per DOB: 06/20/1976   DOA: 11/08/2017   DOS: 11/12/2017   Date of Service: the patient was seen and examined on 11/12/2017  Subjective: Pain controlled, patient is able to ambulate.  Passing gas but no BM. Earlier in the morning patient retain 100 cc postvoid.  Later in the afternoon on a repeat bladder scan patient continues to retain 300 cc.  Brief hospital course: Pt. with PMH of hypertension; admitted on 11/08/2017, presented with complaint of abdominal pain and fever, was found to have acute cholecystitis.  GI was consulted.  Recommended HIDA scan which was positive.  Scheduled for surgery Currently further plan is for postop course.  Assessment and Plan: 1.  Sepsis due to acute cholecystitis. Afebrile.  Leukocytosis resolved. Blood cultures so far negative.  UA showed red bacteria although urine culture is negative as well. CT abdomen showed gallbladder distention, CBD dilatation and patient had elevated LFTs.  GI was consulted, HIDA scan performed which is positive for acute cholecystitis. General surgery consulted patient scheduled for laparoscopic cholecystectomy. On IV Zosyn as well as pain medication for now.  Discontinue Zosyn for now. Pain control per surgery, appreciate their input.  Initially there was some concern about possible pyelonephritis due to patient's presentation although gallbladder appears to be the primary suspect.  2.  Postoperative Acute urinary retention,  Started on Flomax. Monitor. Recommend patient to ambulate as well as continue pain control. UA unremarkable.  Reportedly patient was actually in the ER in the beginning of this month for urinary retention as well.  If it does not resolve with Flomax will get ultrasound.  3.  Hypomagnesemia. Corrected.  4.  Bilateral hydronephrosis CT scan on 10/28/2017 showed bilateral hydronephrosis with a  nonobstructing stone. Repeat CT scan on 4/22 shows no evidence of hydronephrosis.  Renal function stable.  Monitor.  Diet: Regular diet DVT Prophylaxis: subcutaneous Heparin  Advance goals of care discussion: full code  Family Communication: family was present at bedside, at the time of interview. The pt provided permission to discuss medical plan with the family. Opportunity was given to ask question and all questions were answered satisfactorily.   Disposition:  Discharge to home doing improvement in retention.  Consultants: General surgery gastroenterology  Procedures: Laparoscopic cholecystectomy  Antibiotics: Anti-infectives (From admission, onward)   Start     Dose/Rate Route Frequency Ordered Stop   11/09/17 0800  piperacillin-tazobactam (ZOSYN) IVPB 3.375 g  Status:  Discontinued     3.375 g 12.5 mL/hr over 240 Minutes Intravenous Every 8 hours 11/09/17 0040 11/11/17 1205   11/09/17 0100  piperacillin-tazobactam (ZOSYN) IVPB 3.375 g  Status:  Discontinued     3.375 g 100 mL/hr over 30 Minutes Intravenous  Once 11/09/17 0051 11/09/17 0052   11/09/17 0100  piperacillin-tazobactam (ZOSYN) IVPB 3.375 g     3.375 g 12.5 mL/hr over 240 Minutes Intravenous  Once 11/09/17 0051 11/09/17 0534   11/09/17 0045  piperacillin-tazobactam (ZOSYN) IVPB 3.375 g  Status:  Discontinued     3.375 g 100 mL/hr over 30 Minutes Intravenous  Once 11/09/17 0034 11/09/17 0050   11/08/17 2200  metroNIDAZOLE (FLAGYL) IVPB 500 mg     500 mg 100 mL/hr over 60 Minutes Intravenous  Once 11/08/17 2156 11/08/17 2307   11/08/17 1930  cefTRIAXone (ROCEPHIN) 2 g in sodium chloride 0.9 % 100 mL IVPB     2 g 200 mL/hr over 30 Minutes Intravenous  Once 11/08/17 1924 11/08/17 2104       Objective: Physical Exam: Vitals:   11/11/17 1452 11/11/17 2209 11/12/17 0615 11/12/17 1324  BP: 123/67 120/74 120/65 (!) 115/54  Pulse: 62 (!) 58 65 82  Resp: 14   17  Temp: 98.6 F (37 C) 98.5 F (36.9 C) 98.3 F  (36.8 C) 98.6 F (37 C)  TempSrc: Oral Oral Oral Oral  SpO2: 96% 98% 97% 97%  Weight:      Height:        Intake/Output Summary (Last 24 hours) at 11/12/2017 1807 Last data filed at 11/12/2017 1325 Gross per 24 hour  Intake 720 ml  Output 900 ml  Net -180 ml   Filed Weights   11/09/17 0100 11/10/17 1533  Weight: 77.6 kg (171 lb) 77.6 kg (171 lb)   General: Alert, Awake and Oriented to Time, Place and Person. Appear in mild distress, affect appropriate Eyes: PERRL, Conjunctiva normal ENT: Oral Mucosa clear moist. Neck: no JVD, no Abnormal Mass Or lumps Cardiovascular: S1 and S2 Present, no Murmur, Peripheral Pulses Present Respiratory: normal respiratory effort, Bilateral Air entry equal and Decreased, no use of accessory muscle, Clear to Auscultation, no Crackles, no wheezes Abdomen: Bowel Sound present, Soft and mild tenderness, no hernia Skin: no redness, no Rash, no induration Extremities: no Pedal edema, no calf tenderness Neurologic: Grossly no focal neuro deficit. Bilaterally Equal motor strength  Data Reviewed: CBC: Recent Labs  Lab 11/08/17 1630 11/09/17 0407 11/10/17 0606 11/11/17 0533 11/12/17 0536  WBC 12.9* 10.1 6.4 9.3 7.7  NEUTROABS  --   --  3.3 7.6 2.7  HGB 13.9 11.8* 11.7* 12.4 11.4*  HCT 41.0 36.2 36.5 38.3 35.6*  MCV 88.7 90.3 90.8 90.3 91.3  PLT 273 254 264 335 320   Basic Metabolic Panel: Recent Labs  Lab 11/08/17 1630 11/09/17 0407 11/10/17 0606 11/11/17 0533 11/12/17 0536  NA 135 136 142 137 141  K 3.6 3.4* 3.9 4.4 4.0  CL 103 104 108 106 105  CO2 21* 23 24 24 26   GLUCOSE 118* 131* 119* 133* 89  BUN 7 6 7 7 12   CREATININE 0.36* 0.47 0.49 0.45 0.47  CALCIUM 9.2 8.0* 9.0 8.8* 9.3  MG  --  1.4* 2.0  --   --   PHOS  --  3.0  --   --   --     Liver Function Tests: Recent Labs  Lab 11/08/17 1630 11/09/17 0407 11/10/17 0606 11/11/17 0533 11/12/17 0536  AST 18 291* 59* 88* 61*  ALT 26 338* 186* 180* 169*  ALKPHOS 59 105 84 127*  99  BILITOT 0.5 0.9 0.5 0.6 0.5  PROT 7.7 6.6 6.9 6.9 6.9  ALBUMIN 4.4 3.4* 3.6 3.6 3.6   Recent Labs  Lab 11/08/17 1630 11/10/17 0606  LIPASE 29 35   No results for input(s): AMMONIA in the last 168 hours. Coagulation Profile: No results for input(s): INR, PROTIME in the last 168 hours. Cardiac Enzymes: No results for input(s): CKTOTAL, CKMB, CKMBINDEX, TROPONINI in the last 168 hours. BNP (last 3 results) No results for input(s): PROBNP in the last 8760 hours. CBG: No results for input(s): GLUCAP in the last 168 hours. Studies: No results found.  Scheduled Meds: . acetaminophen  1,000 mg Oral TID   Or  . acetaminophen  650 mg Rectal TID  . Chlorhexidine Gluconate Cloth  6 each Topical Once  . docusate sodium  100 mg Oral BID  . heparin injection (subcutaneous)  5,000 Units Subcutaneous Q8H  . ketorolac  15 mg Intravenous Q6H  . polyethylene glycol  17 g Oral Daily  . tamsulosin  0.4 mg Oral Daily   Continuous Infusions:  PRN Meds: methocarbamol, ondansetron **OR** ondansetron (ZOFRAN) IV, oxyCODONE  Time spent: 35 minutes  Author: Lynden Oxford, MD [facility] Pager: [phone] 11/12/2017 6:07 PM  If 7PM-7AM, please contact night-coverage at [email or web address], password [facility]

## 2017-11-12 NOTE — Progress Notes (Signed)
[facility] Progress Note  2 Days Post-Op  Subjective: CC- urinary retention Patient states that she is feeling better today than yesterday. Continues to have some RUQ pain, but it is less than yesterday. Denies n/v. Tolerating diet. Last BM 4/23, passing flatus. Patient reports having to undergo in and out cath for urinary retention last night and this morning. She reports persistent dysuria and dark colored urine. Denies flank pain. Urine culture negative on admission.  Objective: Vital signs in last 24 hours: Temp:  [98.2 F (36.8 C)-98.6 F (37 C)] 98.3 F (36.8 C) (04/26 0615) Pulse Rate:  [58-67] 65 (04/26 0615) Resp:  [14-16] 14 (04/25 1452) BP: (119-123)/(58-74) 120/65 (04/26 0615) SpO2:  [96 %-98 %] 97 % (04/26 0615) Last BM Date: 11/08/17  Intake/Output from previous day: 04/25 0701 - 04/26 0700 In: 3008.2 [P.O.:1200; I.V.:1758.2; IV Piggyback:50] Out: 850 [Urine:850] Intake/Output this shift: No intake/output data recorded.  PE: Gen:  Alert, NAD, pleasant HEENT: EOM's intact, pupils equal and round Pulm:  effort normal Abd: Soft, ND, appropriately tender, +BS, lap incisions cdi Skin: no rashes noted, warm and dry  Lab Results:  Recent Labs    11/11/17 0533 11/12/17 0536  WBC 9.3 7.7  HGB 12.4 11.4*  HCT 38.3 35.6*  PLT 335 320   BMET Recent Labs    11/11/17 0533 11/12/17 0536  NA 137 141  K 4.4 4.0  CL 106 105  CO2 24 26  GLUCOSE 133* 89  BUN 7 12  CREATININE 0.45 0.47  CALCIUM 8.8* 9.3   PT/INR No results for input(s): LABPROT, INR in the last 72 hours. CMP     Component Value Date/Time   NA 141 11/12/2017 0536   NA 141 11/05/2017 1129   K 4.0 11/12/2017 0536   CL 105 11/12/2017 0536   CO2 26 11/12/2017 0536   GLUCOSE 89 11/12/2017 0536   BUN 12 11/12/2017 0536   BUN 8 11/05/2017 1129   CREATININE 0.47 11/12/2017 0536   CALCIUM 9.3 11/12/2017 0536   PROT 6.9 11/12/2017 0536   PROT 7.4 11/05/2017 1129   ALBUMIN 3.6  11/12/2017 0536   ALBUMIN 4.8 11/05/2017 1129   AST 61 (H) 11/12/2017 0536   ALT 169 (H) 11/12/2017 0536   ALKPHOS 99 11/12/2017 0536   BILITOT 0.5 11/12/2017 0536   BILITOT 0.6 11/05/2017 1129   GFRNONAA >60 11/12/2017 0536   GFRAA >60 11/12/2017 0536   Lipase     Component Value Date/Time   LIPASE 35 11/10/2017 0606       Studies/Results: Dg Cholangiogram Operative  Result Date: 11/10/2017 CLINICAL DATA:  Cholelithiasis, abdominal pain EXAM: INTRAOPERATIVE CHOLANGIOGRAM TECHNIQUE: Cholangiographic images from the C-arm fluoroscopic device were submitted for interpretation post-operatively. Please see the procedural report for the amount of contrast and the fluoroscopy time utilized. COMPARISON:  11/09/2017 FINDINGS: Intraoperative cholangiogram performed during the laparoscopic procedure. The biliary confluence, common hepatic duct, and common bile duct are mildly dilated with eventual drainage into the duodenum. No significant filling defect, obstruction, or stricture. IMPRESSION: Patent biliary system.  Mild nonspecific dilatation. Electronically Signed   By: Judie Petit.  Shick M.D.   On: 11/10/2017 19:08    Anti-infectives: Anti-infectives (From admission, onward)   Start     Dose/Rate Route Frequency Ordered Stop   11/09/17 0800  piperacillin-tazobactam (ZOSYN) IVPB 3.375 g  Status:  Discontinued     3.375 g 12.5 mL/hr over 240 Minutes Intravenous Every 8 hours 11/09/17 0040 11/11/17 1205   11/09/17 0100  piperacillin-tazobactam (ZOSYN) IVPB 3.375 g  Status:  Discontinued     3.375 g 100 mL/hr over 30 Minutes Intravenous  Once 11/09/17 0051 11/09/17 0052   11/09/17 0100  piperacillin-tazobactam (ZOSYN) IVPB 3.375 g     3.375 g 12.5 mL/hr over 240 Minutes Intravenous  Once 11/09/17 0051 11/09/17 0534   11/09/17 0045  piperacillin-tazobactam (ZOSYN) IVPB 3.375 g  Status:  Discontinued     3.375 g 100 mL/hr over 30 Minutes Intravenous  Once 11/09/17 0034 11/09/17 0050   11/08/17  2200  metroNIDAZOLE (FLAGYL) IVPB 500 mg     500 mg 100 mL/hr over 60 Minutes Intravenous  Once 11/08/17 2156 11/08/17 2307   11/08/17 1930  cefTRIAXone (ROCEPHIN) 2 g in sodium chloride 0.9 % 100 mL IVPB     2 g 200 mL/hr over 30 Minutes Intravenous  Once 11/08/17 1924 11/08/17 2104       Assessment/Plan Nephrolithiasis  Bilateral hydroureteronephrosis- resolved on CT 4/22 Urinary retention - negative Ucx 4/22. Repeat u/a and start flomax  Acute cholecystitis S/p laparoscopic cholecystectomy with IOC 4/24 Dr. Daphine DeutscherMartin - POD 2 - IOC negative - LFTs trending down  ID -zosyn 4/23>> FEN -regular diet, miralax/colace VTE -SCDs, heparin Foley -none Follow up - [facility]  Plan-Add colace/miralax for bowel regimen. Check u/a due to persistent dysuria and urinary retention, start flomax. Pain med rx and new work note on chart, discharge instructions and f/u info on AVS.    LOS: 4 days    Franne FortsBrooke A Yeraldin Litzenberger , [facility] 11/12/2017, 8:38 AM Pager: [phone] Consults: [phone] Mon-Fri 7:00 am-4:30 pm Sat-Sun 7:00 am-11:30 am

## 2017-11-12 NOTE — Plan of Care (Signed)
Plan of care discussed.   

## 2017-11-13 LAB — CBC WITH DIFFERENTIAL/PLATELET
BASOS ABS: 0 10*3/uL (ref 0.0–0.1)
Basophils Relative: 0 %
Eosinophils Absolute: 0.1 10*3/uL (ref 0.0–0.7)
Eosinophils Relative: 1 %
HCT: 36.7 % (ref 36.0–46.0)
Hemoglobin: 12 g/dL (ref 12.0–15.0)
Lymphocytes Relative: 50 %
Lymphs Abs: 3.9 10*3/uL (ref 0.7–4.0)
MCH: 29.6 pg (ref 26.0–34.0)
MCHC: 32.7 g/dL (ref 30.0–36.0)
MCV: 90.6 fL (ref 78.0–100.0)
MONO ABS: 0.7 10*3/uL (ref 0.1–1.0)
MONOS PCT: 9 %
NEUTROS ABS: 3.2 10*3/uL (ref 1.7–7.7)
Neutrophils Relative %: 40 %
PLATELETS: 356 10*3/uL (ref 150–400)
RBC: 4.05 MIL/uL (ref 3.87–5.11)
RDW: 13.6 % (ref 11.5–15.5)
WBC: 7.9 10*3/uL (ref 4.0–10.5)

## 2017-11-13 LAB — COMPREHENSIVE METABOLIC PANEL
ALBUMIN: 3.5 g/dL (ref 3.5–5.0)
ALK PHOS: 102 U/L (ref 38–126)
ALT: 124 U/L — ABNORMAL HIGH (ref 14–54)
ANION GAP: 12 (ref 5–15)
AST: 35 U/L (ref 15–41)
BILIRUBIN TOTAL: 0.4 mg/dL (ref 0.3–1.2)
BUN: 13 mg/dL (ref 6–20)
CO2: 23 mmol/L (ref 22–32)
Calcium: 9.4 mg/dL (ref 8.9–10.3)
Chloride: 106 mmol/L (ref 101–111)
Creatinine, Ser: 0.48 mg/dL (ref 0.44–1.00)
GFR calc Af Amer: >60 mL/min (ref >60)
GFR calc non Af Amer: >60 mL/min (ref >60)
GLUCOSE: 91 mg/dL (ref 65–99)
Potassium: 4.1 mmol/L (ref 3.5–5.1)
Sodium: 141 mmol/L (ref 135–145)
TOTAL PROTEIN: 6.4 g/dL — AB (ref 6.5–8.1)

## 2017-11-13 LAB — CULTURE, BLOOD (ROUTINE X 2)
CULTURE: NO GROWTH
CULTURE: NO GROWTH

## 2017-11-13 MED ORDER — DOCUSATE SODIUM 100 MG PO CAPS: 100.0000 mg | ORAL_CAPSULE | Freq: Two times a day (BID) | ORAL | 0 refills | Status: DC

## 2017-11-13 MED ORDER — POLYETHYLENE GLYCOL 3350 17 G PO PACK: 17.0000 g | PACK | Freq: Every day | ORAL | 0 refills | Status: DC

## 2017-11-13 MED ORDER — TAMSULOSIN HCL 0.4 MG PO CAPS: 0.4000 mg | ORAL_CAPSULE | Freq: Every day | ORAL | 0 refills | Status: DC

## 2017-11-13 NOTE — Progress Notes (Signed)
[facility]  [phone] General Surgery Progress Note   LOS: 5 days  POD -  3 Days Post-Op  Chief Complaint: Abdominal pain  Assessment and Plan: 1.  LAPAROSCOPIC CHOLECYSTECTOMY WITH INTRAOPERATIVE CHOLANGIOGRAM - 11/10/2017 - Sharon Johnson to go home from our standpoint.  Follow up with Dr. Daphine Deutscher in 2 to 3 weeks.  2. DVT prophylaxis - SQ Heparin   Active Problems:   Lower abdominal pain   SIRS (systemic inflammatory response syndrome) (HCC)  Subjective:  Sore, but doing well.  Daughter, Morrie Sheldon, in room with patient.  Objective:   Vitals:   11/12/17 2134 11/13/17 0523  BP: 128/86 134/83  Pulse: 70 65  Resp: 16 16  Temp: 98.3 F (36.8 C) 98.6 F (37 C)  SpO2: 99% 97%     Intake/Output from previous day:  04/26 0701 - 04/27 0700 In: 840 [P.O.:840] Out: 500 [Urine:500]  Intake/Output this shift:  No intake/output data recorded.   Physical Exam:   General: WN Hispanic F who is alert and oriented.  She speaks [place] fairly well.   HEENT: Normal. Pupils equal. .   Lungs: Clear.   Abdomen: Soft   Wound: Look good.   Lab Results:    Recent Labs    11/12/17 0536 11/13/17 0520  WBC 7.7 7.9  HGB 11.4* 12.0  HCT 35.6* 36.7  PLT 320 356    BMET   Recent Labs    11/12/17 0536 11/13/17 0520  NA 141 141  K 4.0 4.1  CL 105 106  CO2 26 23  GLUCOSE 89 91  BUN 12 13  CREATININE 0.47 0.48  CALCIUM 9.3 9.4    PT/INR  No results for input(s): LABPROT, INR in the last 72 hours.  ABG  No results for input(s): PHART, HCO3 in the last 72 hours.  Invalid input(s): PCO2, PO2   Studies/Results:  No results found.   Anti-infectives:   Anti-infectives (From admission, onward)   Start     Dose/Rate Route Frequency Ordered Stop   11/09/17 0800  piperacillin-tazobactam (ZOSYN) IVPB 3.375 g  Status:  Discontinued     3.375 g 12.5 mL/hr over 240 Minutes Intravenous Every 8 hours 11/09/17 0040 11/11/17 1205   11/09/17 0100   piperacillin-tazobactam (ZOSYN) IVPB 3.375 g  Status:  Discontinued     3.375 g 100 mL/hr over 30 Minutes Intravenous  Once 11/09/17 0051 11/09/17 0052   11/09/17 0100  piperacillin-tazobactam (ZOSYN) IVPB 3.375 g     3.375 g 12.5 mL/hr over 240 Minutes Intravenous  Once 11/09/17 0051 11/09/17 0534   11/09/17 0045  piperacillin-tazobactam (ZOSYN) IVPB 3.375 g  Status:  Discontinued     3.375 g 100 mL/hr over 30 Minutes Intravenous  Once 11/09/17 0034 11/09/17 0050   11/08/17 2200  metroNIDAZOLE (FLAGYL) IVPB 500 mg     500 mg 100 mL/hr over 60 Minutes Intravenous  Once 11/08/17 2156 11/08/17 2307   11/08/17 1930  cefTRIAXone (ROCEPHIN) 2 g in sodium chloride 0.9 % 100 mL IVPB     2 g 200 mL/hr over 30 Minutes Intravenous  Once 11/08/17 1924 11/08/17 2104      Ovidio Kin, MD, FACS Pager: [phone] [facility] Office: [phone] 11/13/2017

## 2017-11-13 NOTE — Progress Notes (Signed)
Pt discharged to home after foley placement and foley bag teaching. Both patient and husband were able to verbalize teaching and demonstrate cleaning and switching from leg bag to overnight bag. Discussed discharge teaching as well when to call to set up follow up appointment. Patient verbalized understanding. Discharged to home with prescriptions and supplies.

## 2017-11-16 NOTE — Discharge Summary (Signed)
[facility] Discharge Summary   Patient: Sharon Johnson OZH:[id number]   PCP: Patient, No Pcp Per DOB: 1976/03/07   Date of admission: 11/08/2017   Date of discharge: 11/13/2017    Discharge Diagnoses:  Active Problems:   Lower abdominal pain   SIRS (systemic inflammatory response syndrome) (HCC)   Admitted From: home Disposition: Home  Recommendations for Outpatient Follow-up:  1. Please follow-up with surgery and urology as an outpatient  Follow-up Information    [facility], [place]. Go on 11/23/2017.   Specialty:  General Surgery Why:  Your appointment is 05/07 at 11:30 am. Please arrive 30 minutes prior to your appointment to check in and fill out paperwork. Bring photo ID and insurance information. Contact information: [address] [place] [address] [phone]       PCP. Schedule an appointment as soon as possible for a visit in 1 week(s).        Heloise Purpura, MD. Schedule an appointment as soon as possible for a visit in 1 week(s).   Specialty:  Urology Contact information: [address] [place] [address] [phone]          Diet recommendation: Cardiac diet  Activity: The patient is advised to gradually reintroduce usual activities.  Discharge Condition: good  Code Status: Full code  History of present illness: As per the H and P dictated on admission, "Presented with   abdominal pain back pain and fever associated some dysuria. Today fever was up to 102. Pain in her flanks and suprapubic.  Patient had  recent vitamin infusion had [facility] on 10 4 April afterwards she developed sharp abdominal pain and bilateral flank pain associated with  nausea.  Was later found out that she had an infusion of the magnesium vitamin C vitamin B12 and B6 as well as fish oil supplement.  Other family members have had similar abdominal pain. initia  CT of abdomen was done showed no  evidence of pancreatitis showed gallstones but no cholecystitis. RUQ [place] no cholecystitis. Common bile duct upper limits of normal at 6 mm No choledocholithiasis."  Hospital Course:  Summary of her active problems in the hospital is as following. 1.  Sepsis due to acute cholecystitis. Afebrile.  Leukocytosis resolved. Blood cultures so far negative.  UA showed red bacteria although urine culture is negative as well. CT abdomen showed gallbladder distention, CBD dilatation and patient had elevated LFTs.  GI was consulted, HIDA scan performed which is positive for acute cholecystitis. General surgery consulted patient scheduled for laparoscopic cholecystectomy. On IV Zosyn as well as pain medication for now.  Discontinue Zosyn for now. Pain control per surgery, appreciate their input.  Initially there was some concern about possible pyelonephritis due to patient's presentation although gallbladder appears to be the primary suspect.  2.  Postoperative Acute urinary retention,  Started on Flomax. Monitor. Recommend patient to ambulate as well as continue pain control. UA unremarkable.  Reportedly patient was actually in the ER in the beginning of this month for urinary retention as well.   Due to persistent retention despite trying Flomax, patient received Foley catheter.  Discussed with urology and recommend outpatient work-up, information provided to patient regarding follow-up  3.  Hypomagnesemia. Corrected.  4.  Bilateral hydronephrosis CT scan on 10/28/2017 showed bilateral hydronephrosis with a nonobstructing stone. Repeat CT scan on 4/22 shows no evidence of hydronephrosis.  Renal function stable.  Monitor.  All other chronic medical condition were stable  during the hospitalization.  Patient was ambulatory without any assistance. On the day of the discharge the patient's vitals were stable, and no other acute medical condition were reported by patient. the patient was felt safe to  be discharge at home with family.  Consultants: General surgery  Procedures: S/P laparoscopic cholecystectomy  DISCHARGE MEDICATION: Allergies as of 11/13/2017   No Known Allergies     Medication List    TAKE these medications   docusate sodium 100 MG capsule Commonly known as:  COLACE Take 1 capsule (100 mg total) by mouth 2 (two) times daily.   ibuprofen 800 MG tablet Commonly known as:  ADVIL,MOTRIN Take 1 tablet (800 mg total) by mouth every 8 (eight) hours as needed (for pain.).   levonorgestrel 20 MCG/24HR IUD Commonly known as:  MIRENA 1 each by Intrauterine route once.   methocarbamol 500 MG tablet Commonly known as:  ROBAXIN Take 1 tablet (500 mg total) by mouth every 8 (eight) hours as needed for muscle spasms.   MULTIVITAMIN GUMMIES ADULT Chew Chew 1 tablet by mouth daily.   oxyCODONE 5 MG immediate release tablet Commonly known as:  Oxy IR/ROXICODONE Take 1 tablet (5 mg total) by mouth every 6 (six) hours as needed for severe pain.   polyethylene glycol packet Commonly known as:  MIRALAX / GLYCOLAX Take 17 g by mouth daily.   tamsulosin 0.4 MG Caps capsule Commonly known as:  FLOMAX Take 1 capsule (0.4 mg total) by mouth daily. What changed:  Another medication with the same name was added. Make sure you understand how and when to take each.   tamsulosin 0.4 MG Caps capsule Commonly known as:  FLOMAX Take 1 capsule (0.4 mg total) by mouth daily. What changed:  You were already taking a medication with the same name, and this prescription was added. Make sure you understand how and when to take each.      No Known Allergies Discharge Instructions    Diet - low sodium heart healthy   Complete by:  As directed    Discharge instructions   Complete by:  As directed    It is important that you read following instructions as well as go over your medication list with RN to help you understand your care after this hospitalization.  Discharge  Instructions:   Please follow-up with PCP in one week  Please request your primary care physician to go over all Hospital Tests and Procedure/Radiological results at the follow up,  Please get all Hospital records sent to your PCP by signing hospital release before you go home.   Do not drive, operating heavy machinery, perform activities at heights, swimming or participation in water activities or provide baby sitting services while you are on Pain, Sleep and Anxiety Medications; until you have been seen by Primary Care Physician or a Neurologist and advised to do so again. Do not take more than prescribed Pain, Sleep and Anxiety Medications. You were cared for by a hospitalist during your hospital stay. If you have any questions about your discharge medications or the care you received while you were in the hospital after you are discharged, you can call the unit and ask to speak with the hospitalist on call if the hospitalist that took care of you is not available.  Once you are discharged, your primary care physician will handle any further medical issues. Please note that NO REFILLS for any discharge medications will be authorized once you are discharged, as it is imperative  that you return to your primary care physician (or establish a relationship with a primary care physician if you do not have one) for your aftercare needs so that they can reassess your need for medications and monitor your lab values. You Must read complete instructions/literature along with all the possible adverse reactions/side effects for all the Medicines you take and that have been prescribed to you. Take any new Medicines after you have completely understood and accept all the possible adverse reactions/side effects. Wear Seat belts while driving. If you have smoked or chewed Tobacco in the last 2 yrs please stop smoking and/or stop any Recreational drug use.   Increase activity slowly   Complete by:  As directed     Urinary leg bag   Complete by:  As directed      Discharge Exam: Filed Weights   11/09/17 0100 11/10/17 1533  Weight: 77.6 kg (171 lb) 77.6 kg (171 lb)   Vitals:   11/12/17 2134 11/13/17 0523  BP: 128/86 134/83  Pulse: 70 65  Resp: 16 16  Temp: 98.3 F (36.8 C) 98.6 F (37 C)  SpO2: 99% 97%   General: Appear in no distress, no Rash; Oral Mucosa moist. Cardiovascular: S1 and S2 Present, no Murmur, no JVD Respiratory: Bilateral Air entry present and Clear to Auscultation, no Crackles, no wheezes Abdomen: Bowel Sound present, Soft and no tenderness Extremities: no Pedal edema, no calf tenderness Neurology: Grossly no focal neuro deficit.  The results of significant diagnostics from this hospitalization (including imaging, microbiology, ancillary and laboratory) are listed below for reference.    Significant Diagnostic Studies: Dg Chest 2 View  Result Date: 11/08/2017 CLINICAL DATA:  Continued back and abdominal pain with fever. Pain is worsened since yesterday. EXAM: CHEST - 2 VIEW COMPARISON:  CT abdomen and pelvis from earlier on the same day. FINDINGS: The heart size and mediastinal contours are within normal limits. Both lungs are clear. Mild eventration of the right hemidiaphragm. Atelectasis posteriorly at the lung bases as seen on the same day CT likely accounts for the slightly irregular appearance of the posterior costophrenic angle. No pneumothorax or effusion. No acute osseous appearing abnormality. Degenerative changes are noted along the midthoracic spine. IMPRESSION: No active cardiopulmonary disease. Electronically Signed   By: Tollie Eth M.D.   On: 11/08/2017 23:39   Dg Cholangiogram Operative  Result Date: 11/10/2017 CLINICAL DATA:  Cholelithiasis, abdominal pain EXAM: INTRAOPERATIVE CHOLANGIOGRAM TECHNIQUE: Cholangiographic images from the C-arm fluoroscopic device were submitted for interpretation post-operatively. Please see the procedural report for the amount of  contrast and the fluoroscopy time utilized. COMPARISON:  11/09/2017 FINDINGS: Intraoperative cholangiogram performed during the laparoscopic procedure. The biliary confluence, common hepatic duct, and common bile duct are mildly dilated with eventual drainage into the duodenum. No significant filling defect, obstruction, or stricture. IMPRESSION: Patent biliary system.  Mild nonspecific dilatation. Electronically Signed   By: Judie Petit.  Shick M.D.   On: 11/10/2017 19:08   Ct Abdomen Pelvis W Contrast  Result Date: 11/08/2017 CLINICAL DATA:  Lower abdominal and back pain for the past 10 days. EXAM: CT ABDOMEN AND PELVIS WITH CONTRAST TECHNIQUE: Multidetector CT imaging of the abdomen and pelvis was performed using the standard protocol following bolus administration of intravenous contrast. CONTRAST:  ISOVUE-300 IOPAMIDOL (ISOVUE-300) INJECTION 61% COMPARISON:  CT abdomen pelvis dated October 28, 2017. FINDINGS: Lower chest: No acute abnormality. Hepatobiliary: No focal liver abnormality. Persistent gallbladder distention and mild common bile duct dilatation to 9 mm, similar to prior  study. No gallstones or gallbladder wall thickening. Pancreas: Unremarkable. No pancreatic ductal dilatation or surrounding inflammatory changes. Spleen: Normal in size without focal abnormality. Adrenals/Urinary Tract: The adrenal glands are unremarkable. Resolved bilateral hydroureteronephrosis. Bilateral nonobstructing renal calculi again noted. No ureteral calculi. The bladder is unremarkable. Stomach/Bowel: Stomach is within normal limits. Appendix appears normal. No evidence of bowel wall thickening, distention, or inflammatory changes. Vascular/Lymphatic: No significant vascular findings are present. No enlarged abdominal or pelvic lymph nodes. Reproductive: IUD appropriately positioned within the uterus. No adnexal mass. Other: No free fluid or pneumoperitoneum. Musculoskeletal: No acute or significant osseous findings.  IMPRESSION: 1. Unchanged gallbladder distention and mild common bile duct dilatation without CT evidence of cholelithiasis or choledocholithiasis. 2. Resolved bilateral hydroureteronephrosis. 3. Unchanged bilateral nonobstructive nephrolithiasis. Electronically Signed   By: Obie Dredge M.D.   On: 11/08/2017 21:43   Ct Abdomen Pelvis W Contrast  Result Date: 10/28/2017 CLINICAL DATA:  Acute abdominal pain. EXAM: CT ABDOMEN AND PELVIS WITH CONTRAST TECHNIQUE: Multidetector CT imaging of the abdomen and pelvis was performed using the standard protocol following bolus administration of intravenous contrast. CONTRAST:  100 cc Isovue-300 IV COMPARISON:  None. FINDINGS: Lower chest: The lung bases are clear. Hepatobiliary: Decreased hepatic density consistent with steatosis. No focal lesion. Mild gallbladder distention without calcified stone or pericholecystic inflammation. Common bile duct is dilated measuring 10 mm. No visualized choledocholithiasis. Pancreas: No ductal dilatation or inflammation. Spleen: Normal in size without focal abnormality. Adrenals/Urinary Tract: Normal adrenal glands. Bilateral hydroureteronephrosis. Heterogeneous enhancement of both kidneys. There are nonobstructing stones within both kidneys. No ureteral calculi. No perirenal or intrarenal fluid collection. Urinary bladder is distended. No bladder wall thickening. No bladder stone. Stomach/Bowel: Stomach is within normal limits. Appendix appears normal. No evidence of bowel wall thickening, distention, or inflammatory changes. Vascular/Lymphatic: No significant vascular findings are present. No enlarged abdominal or pelvic lymph nodes. Reproductive: IUD appropriately positioned in the uterus. No adnexal mass. Other: No free air, free fluid, or intra-abdominal fluid collection. Musculoskeletal: There are no acute or suspicious osseous abnormalities. IMPRESSION: 1. Gallbladder and biliary dilatation without calcified gallstone or  choledocholithiasis. Consider right upper quadrant ultrasound and possible MRCP for further evaluation. 2. Bilateral hydroureteronephrosis. Urinary bladder distention. Nonobstructing stones in both kidneys without ureteral stone. Recommend correlation for urinary retention or urinary tract infection. Electronically Signed   By: Rubye Oaks M.D.   On: 10/28/2017 02:36   Nm Hepato W/eject Fract  Result Date: 11/09/2017 CLINICAL DATA:  Gallbladder distention on CT EXAM: NUCLEAR MEDICINE HEPATOBILIARY IMAGING VIEWS: Anterior right upper quadrant RADIOPHARMACEUTICALS:  5.38 mCi Tc-61m  Choletec IV COMPARISON:  CT abdomen and pelvis November 09, 2014 FINDINGS: Liver uptake of radiotracer is within normal limits. There is prompt visualization of small bowel, indicating patency of the common bile duct. Images were obtained serially over a 60 minutes time span without gallbladder visualization. After 60 minutes, the patient was given 3.1 mg of morphine sulfate with images obtained over an additional 30 minute time span. Despite the morphine augmentation, gallbladder does not visualize. IMPRESSION: Gallbladder does not visualize during the course of the study even utilizing morphine augmentation. This finding is presumptive evidence for cystic duct obstruction, a finding indicative of acute cholecystitis. There is prompt visualization of small bowel indicating patency of the common bile duct. Electronically Signed   By: Bretta Bang III M.D.   On: 11/09/2017 15:35   US Abdomen Limited  Result Date: 10/28/2017 CLINICAL DATA:  Initial evaluation for acute right upper quadrant pain,  nausea, vomiting EXAM: ULTRASOUND ABDOMEN LIMITED RIGHT UPPER QUADRANT COMPARISON:  Prior CT from earlier the same day. FINDINGS: Gallbladder: Multiple mobile stones present within the gallbladder lumen, largest of which measures 3 cm. Gallbladder wall measure within normal limits at 2.8 mm. No free pericholecystic fluid. No  sonographic Murphy sign elicited on exam. Common bile duct: Diameter: 6.4 mm, upper limits of normal Liver: No focal lesion identified. Within normal limits in parenchymal echogenicity. Portal vein is patent on color Doppler imaging with normal direction of blood flow towards the liver. IMPRESSION: 1. Cholelithiasis with no sonographic features to suggest acute cholecystitis. 2. Common bile duct measures at the upper limits of normal at approximately 6 mm. No choledocholithiasis. Electronically Signed   By: Rise Mu M.D.   On: 10/28/2017 04:25    Microbiology: Recent Results (from the past 240 hour(s))  Urine culture     Status: None   Collection Time: 11/08/17  6:54 PM  Result Value Ref Range Status   Specimen Description   Final    URINE, CLEAN CATCH Performed at [facility], [address], [place], [place] [address]    Special Requests NONE  Final   Culture   Final    NO GROWTH Performed at [facility], [address] [place], [place] [address]    Report Status 11/10/2017 FINAL  Final  Blood culture (routine x 2)     Status: None   Collection Time: 11/08/17  7:24 PM  Result Value Ref Range Status   Specimen Description   Final    BLOOD RIGHT ANTECUBITAL Performed at [facility], [address] [place], [place] [address]    Special Requests   Final    BOTTLES DRAWN AEROBIC AND ANAEROBIC Blood Culture results may not be optimal due to an excessive volume of blood received in culture bottles Performed at [facility], [address] [place], [place] [address]    Culture   Final    NO GROWTH 5 DAYS Performed at [facility], [address], [place], [place] [address]    Report Status 11/13/2017 FINAL  Final  Blood culture (routine x 2)     Status: None   Collection Time: 11/08/17  7:29 PM  Result Value Ref Range Status   Specimen Description   Final    BLOOD LEFT HAND Performed at [facility], [address], [place], [place] [address]    Special Requests   Final    BOTTLES DRAWN AEROBIC AND ANAEROBIC Blood Culture results may not be optimal due to an inadequate volume of blood received in culture bottles Performed at [facility], [address], [place], [place] [address]    Culture   Final    NO GROWTH 5 DAYS Performed at [facility], [address] [place], [place] [address]    Report Status 11/13/2017 FINAL  Final     Labs: CBC: Recent Labs  Lab 11/10/17 0606 11/11/17 0533 11/12/17 0536 11/13/17 0520  WBC 6.4 9.3 7.7 7.9  NEUTROABS 3.3 7.6 2.7 3.2  HGB 11.7* 12.4 11.4* 12.0  HCT 36.5 38.3 35.6* 36.7  MCV 90.8 90.3 91.3 90.6  PLT 264 335 320 356   Basic Metabolic Panel: Recent Labs  Lab 11/10/17 0606 11/11/17 0533 11/12/17 0536 11/13/17 0520  NA 142 137 141 141  K 3.9 4.4 4.0 4.1  CL 108 106 105 106  CO2 24 24  26 23  GLUCOSE 119* 133* 89 91  BUN CREATININE 0.49 0.45 0.47 0.48  CALCIUM 9.0 8.8* 9.3 9.4  MG 2.0  --   --   --    Liver Function Tests: Recent Labs  Lab 11/10/17 0606 11/11/17 0533 11/12/17 0536 11/13/17 0520  AST 59* 88* 61* 35  ALT 186* 180* 169* 124*  ALKPHOS 84 127* 99 102  BILITOT 0.5 0.6 0.5 0.4  PROT 6.9 6.9 6.9 6.4*  ALBUMIN 3.6 3.6 3.6 3.5   Recent Labs  Lab 11/10/17 0606  LIPASE 35   Time spent: 35 minutes  Signed:  Lynden Oxford  [facility] 11/13/2017 , 7:00 PM

## 2017-12-23 ENCOUNTER — Encounter (HOSPITAL_COMMUNITY): Payer: Self-pay | Admitting: Internal Medicine

## 2018-04-08 ENCOUNTER — Ambulatory Visit: Payer: Self-pay | Attending: Family Medicine | Admitting: Family Medicine

## 2018-04-08 ENCOUNTER — Encounter: Payer: Self-pay | Admitting: Family Medicine

## 2018-04-08 VITALS — BP 141/94 | HR 72 | Temp 98.0°F | Ht 62.0 in | Wt 177.8 lb

## 2018-04-08 DIAGNOSIS — Z79899 Other long term (current) drug therapy: Secondary | ICD-10-CM | POA: Insufficient documentation

## 2018-04-08 DIAGNOSIS — Z23 Encounter for immunization: Secondary | ICD-10-CM

## 2018-04-08 DIAGNOSIS — K810 Acute cholecystitis: Secondary | ICD-10-CM

## 2018-04-08 DIAGNOSIS — E785 Hyperlipidemia, unspecified: Secondary | ICD-10-CM

## 2018-04-08 DIAGNOSIS — R1013 Epigastric pain: Secondary | ICD-10-CM

## 2018-04-08 DIAGNOSIS — Z803 Family history of malignant neoplasm of breast: Secondary | ICD-10-CM | POA: Insufficient documentation

## 2018-04-08 DIAGNOSIS — Z1239 Encounter for other screening for malignant neoplasm of breast: Secondary | ICD-10-CM

## 2018-04-08 DIAGNOSIS — I10 Essential (primary) hypertension: Secondary | ICD-10-CM | POA: Insufficient documentation

## 2018-04-08 DIAGNOSIS — G43909 Migraine, unspecified, not intractable, without status migrainosus: Secondary | ICD-10-CM | POA: Insufficient documentation

## 2018-04-08 DIAGNOSIS — Z1231 Encounter for screening mammogram for malignant neoplasm of breast: Secondary | ICD-10-CM

## 2018-04-08 DIAGNOSIS — K760 Fatty (change of) liver, not elsewhere classified: Secondary | ICD-10-CM | POA: Insufficient documentation

## 2018-04-08 DIAGNOSIS — R51 Headache: Secondary | ICD-10-CM

## 2018-04-08 DIAGNOSIS — R102 Pelvic and perineal pain: Secondary | ICD-10-CM

## 2018-04-08 DIAGNOSIS — R1030 Lower abdominal pain, unspecified: Secondary | ICD-10-CM | POA: Insufficient documentation

## 2018-04-08 DIAGNOSIS — Z8249 Family history of ischemic heart disease and other diseases of the circulatory system: Secondary | ICD-10-CM | POA: Insufficient documentation

## 2018-04-08 DIAGNOSIS — R519 Headache, unspecified: Secondary | ICD-10-CM

## 2018-04-08 DIAGNOSIS — R1024 Suprapubic pain: Secondary | ICD-10-CM

## 2018-04-08 MED ORDER — SULFAMETHOXAZOLE-TRIMETHOPRIM 800-160 MG PO TABS: 1.0000 | ORAL_TABLET | Freq: Two times a day (BID) | ORAL | 0 refills | Status: AC

## 2018-04-08 MED ORDER — ATORVASTATIN CALCIUM 20 MG PO TABS: 20.0000 mg | ORAL_TABLET | Freq: Every day | ORAL | 6 refills | Status: DC

## 2018-04-08 MED ORDER — METHOCARBAMOL 500 MG PO TABS: 500.0000 mg | ORAL_TABLET | Freq: Three times a day (TID) | ORAL | 1 refills | Status: DC | PRN

## 2018-04-08 MED ORDER — AMLODIPINE BESYLATE 5 MG PO TABS: 5.0000 mg | ORAL_TABLET | Freq: Every day | ORAL | 3 refills | Status: DC

## 2018-04-08 MED FILL — ATORVASTATIN 20 MG TABLET: 20 | 30 days supply | Qty: 30 | Fill #0

## 2018-04-08 MED FILL — SULFAMETHOXAZOLE-TMP DS TAB: 800-160 | 3 days supply | Qty: 6 | Fill #0

## 2018-04-08 MED FILL — METHOCARBAMOL 500 MG TABS: 500 | 10 days supply | Qty: 30 | Fill #0

## 2018-04-08 MED FILL — AMLODIPINE BESYLATE 5 MG TA: 5 | 30 days supply | Qty: 30 | Fill #0

## 2018-04-08 NOTE — Progress Notes (Addendum)
Subjective:    Patient ID: Sharon Johnson, female    DOB: 01/01/1976, 42 y.o.   MRN: [id number]    Due to language barrier, a live interpreter accompanied patient to her visit today  HPI 42 year old female new to the practice.  Patient reports that she has been having some issues with recurrent headaches which are at the top of her head and the back of her neck. Patient reports that she thinks that the headaches may be related to her blood pressure. She was on blood pressure medication in the past and then she was told that she no longer needed to be on the medication. She cannot recall the name of the medication. She has not been on blood pressure medication for the past 1 and 1/2 years. Patient states that in the past that she also received a medication to help with muscle spasm/neck pain but she cannot recall the name. Patient also has migraine type headaches that cause her to be sensitive to light and noise but these headaches are different. Migraine headaches are relieved by an over the counter medication that she gets at [facility] that is similar to Excedrin. Patient does have a dull top of her head headache today as well as neck discomfort and her headache is a 7 on a 01- scale.        Patient is s/p removal of her gallbladder in April of this year but she still has some occasional discomfort in the middle of her upper abdomen and occasional nausea. No burping or belching and no substernal chest pain. Patient occasionally has a pins and needles sensation in her right upper chest but not at today's visit. This sensation comes and goes. Patient also with hyperlipidemia and fatty liver and she is on atorvastatin which she would like to have refilled and she takes an otc fish oil tablet for the fatty liver.       Patient reports that she is single. She is employed as a Advertising copywriter. Patient does not drink or smoke. Patient has a family history of mom with Hypertension and gastritis. Patient does  not know her paternal family history. Patient has had a sister who was diagnosed with breast cancer last year at age 63 and is s/p mastectomy for treatment.     Review of Systems  Constitutional: Positive for fatigue. Negative for chills and fever.  HENT: Positive for congestion. Negative for postnasal drip, sore throat and trouble swallowing.   Respiratory: Negative for cough and shortness of breath.   Cardiovascular: Negative for chest pain, palpitations and leg swelling.  Gastrointestinal: Positive for abdominal pain and nausea.  Genitourinary: Positive for frequency. Negative for dysuria.  Musculoskeletal: Positive for back pain, neck pain and neck stiffness. Negative for joint swelling.  Neurological: Positive for headaches. Negative for dizziness.       Objective:   Physical Exam BP (!) 141/94   Pulse 72   Temp 98 F (36.7 C) (Oral)   Ht 5\' 2"  (1.575 m)   Wt 177 lb 12.8 oz (80.6 kg)   SpO2 97%   BMI 32.52 kg/m Nurse's notes and vital signs reviewed General-well-nourished, well-developed female in no acute distress ENT- TMs dull, patient with moderate edema of the nasal turbinates of fluid, patient with tonsils that are somewhat large, no exudate but patient with presence of tonsillar stone, patient with posterior pharynx erythema. Neck- supple, patient does have some mild posterior cervical paraspinal spasm, no thyromegaly, no carotid bruit, no lymphadenopathy Lungs-clear to  auscultation bilateral Cardiovascular-regular rate and rhythm Chest wall- no reproducible chest wall discomfort with palpation Abdomen- with healed surgical port scars.  Patient with mild epigastric discomfort to palpation, no rebound or guarding.  Patient with mild suprapubic discomfort, no rebound or guarding. Back- patient with mild right CVA tenderness; patient with some muscle spasm over the bilateral trapezius muscles of the back and cervical paraspinous spasm. Neuro-cranial nerves II through XII  intact.  Patient with a grossly normal neurologic exam Extremities-no edema      Assessment & Plan:  1. Essential hypertension Patient's blood pressure slightly elevated at today's visit.  Patient does report a past history of hypertension for which she was on medication.  Patient believes that her current headaches are also related to hypertension.  Patient provided with prescription for amlodipine 5 mg once daily and patient was asked to follow-up in a few weeks to see if her blood pressure is now controlled and if her headaches have improved with use of medication. - amLODipine (NORVASC) 5 MG tablet; Take 1 tablet (5 mg total) by mouth daily.  Dispense: 90 tablet; Refill: 3  2. Hyperlipidemia, unspecified hyperlipidemia type Patient reports a history of hyperlipidemia and at today's visit, patient does have a prescription bottle for Lipitor 20 mg and patient would like to have a refill of this medication.  Patient is not fasting at today's visit.  If patient is fasting at her next visit, she will have lipid panel but will also have CMP in follow-up of her use of medications as well as her new start of amlodipine for control of blood pressure. - atorvastatin (LIPITOR) 20 MG tablet; Take 1 tablet (20 mg total) by mouth daily.  Dispense: 30 tablet; Refill: 6  3. Nonintractable episodic headache, unspecified headache type Patient with complaint of migraine headaches but she has also had recent recurrent dull, top of the head and back of the neck headaches which she believes are related to her blood pressure.  Patient is being placed on amlodipine to control her blood pressure and patient has been asked to return in a few weeks for follow-up of blood pressure and headaches.  Patient was also provided with refill of Robaxin which she was prescribed in the past per her medical records to see if this will help with her muscle spasm/posterior neck discomfort.  4. Suprapubic abdominal pain Patient with  complaint of suprapubic abdominal pain on examination and on review of systems, patient with complaint of urinary frequency.  Patient unfortunately went to the restroom just before her examination and did not feel that she would be able to provide another sample.  Prescription was sent to pharmacy for Septra double strength twice daily x3 days in case of uncomplicated urinary tract infection.  Patient should return to clinic next week if she continues to have the lower abdominal discomfort and urinary frequency. - sulfamethoxazole-trimethoprim (BACTRIM DS,SEPTRA DS) 800-160 MG tablet; Take 1 tablet by mouth 2 (two) times daily for 3 days.  Dispense: 6 tablet; Refill: 0 - methocarbamol (ROBAXIN) 500 MG tablet; Take 1 tablet (500 mg total) by mouth every 8 (eight) hours as needed for muscle spasms.  Dispense: 30 tablet; Refill: 1  5. Epigastric abdominal pain Patient with complaint of some recent onset epigastric abdominal discomfort with nausea.  Patient also may have urinary tract infection and will be treated with antibiotic.  If patient team epigastric abdominal pain and nausea at her follow-up appointment, patient will be started on medication to reduce stomach acid.  6. Screening for breast cancer Patient will be scheduled for screening mammogram as she does have family history of an older sister who was diagnosed last year with breast cancer - MM Digital Screening; Future  7. Need for immunization against influenza Patient was offered and agreed to receive influenza immunization at today's visit.  Patient also received informational handout - Flu Vaccine QUAD 36+ mos IM  An After Visit Summary was printed and given to the patient.  Return in about 4 weeks (around 05/06/2018) for blood pressure.

## 2018-04-08 NOTE — Progress Notes (Signed)
Patient has headache and neck pain.

## 2018-04-29 ENCOUNTER — Ambulatory Visit: Payer: Self-pay | Attending: Family Medicine

## 2018-05-10 ENCOUNTER — Telehealth: Payer: Self-pay | Admitting: Family Medicine

## 2018-05-10 NOTE — Telephone Encounter (Signed)
Patient called to see if she could drop off her ID she says she just got it in the mail. Please follow up with patient.

## 2018-05-11 ENCOUNTER — Telehealth: Payer: Self-pay | Admitting: Family Medicine

## 2018-05-11 MED FILL — AMLODIPINE BESYLATE 5 MG TA: 5 | 30 days supply | Qty: 30 | Fill #1

## 2018-05-11 MED FILL — METHOCARBAMOL 500 MG TABS: 500 | 10 days supply | Qty: 30 | Fill #1

## 2018-05-11 MED FILL — ATORVASTATIN 20 MG TABLET: 20 | 30 days supply | Qty: 30 | Fill #1

## 2018-05-11 NOTE — Telephone Encounter (Signed)
Patient called because she would like to speak with you. Please follow up.

## 2018-05-12 NOTE — Telephone Encounter (Signed)
I called LVM to call back to find out about what we can help her

## 2018-05-16 ENCOUNTER — Encounter: Payer: Self-pay | Admitting: Internal Medicine

## 2018-05-16 ENCOUNTER — Ambulatory Visit: Payer: Self-pay | Attending: Internal Medicine | Admitting: Internal Medicine

## 2018-05-16 VITALS — BP 130/85 | HR 66 | Temp 98.5°F | Resp 16 | Wt 177.2 lb

## 2018-05-16 DIAGNOSIS — Z803 Family history of malignant neoplasm of breast: Secondary | ICD-10-CM | POA: Insufficient documentation

## 2018-05-16 DIAGNOSIS — Z79899 Other long term (current) drug therapy: Secondary | ICD-10-CM | POA: Insufficient documentation

## 2018-05-16 DIAGNOSIS — Z8249 Family history of ischemic heart disease and other diseases of the circulatory system: Secondary | ICD-10-CM | POA: Insufficient documentation

## 2018-05-16 DIAGNOSIS — Z9049 Acquired absence of other specified parts of digestive tract: Secondary | ICD-10-CM | POA: Insufficient documentation

## 2018-05-16 DIAGNOSIS — M5412 Radiculopathy, cervical region: Secondary | ICD-10-CM | POA: Insufficient documentation

## 2018-05-16 DIAGNOSIS — M549 Dorsalgia, unspecified: Secondary | ICD-10-CM | POA: Insufficient documentation

## 2018-05-16 MED ORDER — IBUPROFEN 800 MG PO TABS: 800.0000 mg | ORAL_TABLET | Freq: Three times a day (TID) | ORAL | 0 refills | Status: DC | PRN

## 2018-05-16 MED ORDER — TRAMADOL HCL 50 MG PO TABS: 50.0000 mg | ORAL_TABLET | Freq: Three times a day (TID) | ORAL | 0 refills | Status: DC | PRN

## 2018-05-16 MED FILL — traMADol HCL 50 MG TABS: 50 | 10 days supply | Qty: 30 | Fill #0

## 2018-05-16 MED FILL — IBUPROFEN 800 MG TABLET: 800 | 20 days supply | Qty: 60 | Fill #0

## 2018-05-16 NOTE — Progress Notes (Signed)
Pt states she has been taking ibuprofen 800 and she has applying ice  Pt state her back has bene hurting for a month

## 2018-05-16 NOTE — Progress Notes (Signed)
Patient ID: Sharon Johnson, female    DOB: Apr 13, 1976  MRN: [id number]  CC: Back Pain (upper); Neck Pain; and Arm Pain (right)   Subjective: Sharon Johnson is a 42 y.o. female who presents for UC.  Her 2 daughters are with her.  Her concerns today include:   Pt c/o pain on the RT side of her neck and mid to upper back.  The pain radiates to shoulder and down RT arm into hand Duration is about 1 mth Endorses numbness sometimes in the hand.  No weakness in the arm or hand. 2 wks ago she felt that her RT flank was swollen.  She iced it.  The pain interferes with her sleep and work.  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   He has been taking ibuprofen 800 mg once a day with minimal relief.    Pain is a little better on her days off from work.  She is right-handed.  Patient Active Problem List   Diagnosis Date Noted  . Migraine headache 04/08/2018  . Hypertension 04/08/2018  . SIRS (systemic inflammatory response syndrome) (HCC) 11/08/2017  . Calculus of gallbladder without cholecystitis without obstruction 11/05/2017  . Fatty liver 11/05/2017  . Nausea and vomiting 10/28/2017  . Lower abdominal pain 10/28/2017  . Transaminitis 10/28/2017  . Hyperbilirubinemia 10/28/2017  . Leukocytosis 10/28/2017     Current Outpatient Medications on File Prior to Visit  Medication Sig Dispense Refill  . amLODipine (NORVASC) 5 MG tablet Take 1 tablet (5 mg total) by mouth daily. 90 tablet 3  . atorvastatin (LIPITOR) 20 MG tablet Take 1 tablet (20 mg total) by mouth daily. 30 tablet 6  . levonorgestrel (MIRENA) 20 MCG/24HR IUD 1 each by Intrauterine route once.    . methocarbamol (ROBAXIN) 500 MG tablet Take 1 tablet (500 mg total) by mouth every 8 (eight) hours as needed for muscle spasms. 30 tablet 1  . Multiple Vitamins-Minerals (MULTIVITAMIN GUMMIES ADULT) CHEW Chew 1 tablet by mouth daily.    . polyethylene glycol (MIRALAX /  GLYCOLAX) packet Take 17 g by mouth daily. (Patient not taking: Reported on 04/08/2018) 14 each 0   No current facility-administered medications on file prior to visit.     No Known Allergies  Social History   Socioeconomic History  . Marital status: Single    Spouse name: Not on file  . Number of children: Not on file  . Years of education: Not on file  . Highest education level: Not on file  Occupational History  . Not on file  Social Needs  . Financial resource strain: Not on file  . Food insecurity:    Worry: Not on file    Inability: Not on file  . Transportation needs:    Medical: Not on file    Non-medical: Not on file  Tobacco Use  . Smoking status: Never Smoker  . Smokeless tobacco: Never Used  Substance and Sexual Activity  . Alcohol use: No    Alcohol/week: 0.0 standard drinks  . Drug use: No  . Sexual activity: Not Currently  Lifestyle  . Physical activity:    Days per week: Not on file    Minutes per session: Not on file  . Stress: Not on file  Relationships  . Social connections:    Talks on phone: Not on file    Gets together: Not on file    Attends religious service: Not on file  Active member of club or organization: Not on file    Attends meetings of clubs or organizations: Not on file    Relationship status: Not on file  . Intimate partner violence:    Fear of current or ex partner: Not on file    Emotionally abused: Not on file    Physically abused: Not on file    Forced sexual activity: Not on file  Other Topics Concern  . Not on file  Social History Narrative   houskeeping    Family History  Problem Relation Age of Onset  . Hypertension Mother   . Breast cancer Sister     Past Surgical History:  Procedure Laterality Date  . CHOLECYSTECTOMY N/A 11/10/2017   Procedure: LAPAROSCOPIC CHOLECYSTECTOMY WITH INTRAOPERATIVE CHOLANGIOGRAM;  Surgeon: Luretha Murphy, MD;  Location: [facility];  Service: General;  Laterality: N/A;     ROS: Review of Systems Neg except as above  PHYSICAL EXAM: BP 130/85   Pulse 66   Temp 98.5 F (36.9 C) (Oral)   Resp 16   Wt 177 lb 3.2 oz (80.4 kg)   SpO2 99%   BMI 32.41 kg/m   Physical Exam  General appearance - alert, well appearing, and in no distress Mental status - normal mood, behavior, speech, dress, motor activity, and thought processes Chest - clear to auscultation, no wheezes, rales or rhonchi, symmetric air entry Heart - normal rate, regular rhythm, normal S1, S2, no murmurs, rubs, clicks or gallops Musculoskeletal -mild tenderness on palpation of the cervical spine.  Mild discomfort with passive range of motion of the neck.  Tenderness on palpation over the right trapezius muscle and the anterior and posterior shoulder joint.  She also has tenderness over the supraspinatus and infraspinatus muscles.  Power upper extremities 5/5 bilaterally.  Grip 5/5 bilaterally.  Drop arm test negative.  Reflexes at the wrists and elbows normal bilaterally  ASSESSMENT AND PLAN: 1. Cervical radiculopathy I advised against any heavy lifting for the next 2 weeks.  Work restriction given in the form of a letter. Continue ibuprofen as needed for pain.  Limited prescription given for tramadol.  Patient advised that the medication can cause drowsiness.  NCCSRS reviewed. Recommend use of a heating pad as needed. She has muscle relaxant at home that she can also use as needed. - traMADol (ULTRAM) 50 MG tablet; Take 1 tablet (50 mg total) by mouth every 8 (eight) hours as needed.  Dispense: 30 tablet; Refill: 0 - ibuprofen (ADVIL,MOTRIN) 800 MG tablet; Take 1 tablet (800 mg total) by mouth every 8 (eight) hours as needed for moderate pain (for pain.).  Dispense: 60 tablet; Refill: 0   Patient was given the opportunity to ask questions.  Patient verbalized understanding of the plan and was able to repeat key elements of the plan.  [employer] interpreter used during this  encounter.     Requested Prescriptions   Signed Prescriptions Disp Refills  . traMADol (ULTRAM) 50 MG tablet 30 tablet 0    Sig: Take 1 tablet (50 mg total) by mouth every 8 (eight) hours as needed.  Marland Kitchen ibuprofen (ADVIL,MOTRIN) 800 MG tablet 60 tablet 0    Sig: Take 1 tablet (800 mg total) by mouth every 8 (eight) hours as needed for moderate pain (for pain.).    Return in about 2 weeks (around 05/30/2018) for [place].  Jonah Blue, MD, FACP

## 2018-05-20 ENCOUNTER — Encounter: Payer: Self-pay | Admitting: Family Medicine

## 2018-05-20 ENCOUNTER — Ambulatory Visit: Payer: Self-pay | Attending: Family Medicine | Admitting: Family Medicine

## 2018-05-20 ENCOUNTER — Telehealth: Payer: Self-pay | Admitting: Family Medicine

## 2018-05-20 ENCOUNTER — Other Ambulatory Visit: Payer: Self-pay

## 2018-05-20 VITALS — BP 130/86 | HR 71 | Temp 98.8°F | Resp 18 | Ht 62.0 in | Wt 178.0 lb

## 2018-05-20 DIAGNOSIS — I10 Essential (primary) hypertension: Secondary | ICD-10-CM | POA: Insufficient documentation

## 2018-05-20 DIAGNOSIS — M25511 Pain in right shoulder: Secondary | ICD-10-CM

## 2018-05-20 DIAGNOSIS — M79601 Pain in right arm: Secondary | ICD-10-CM | POA: Insufficient documentation

## 2018-05-20 DIAGNOSIS — R200 Anesthesia of skin: Secondary | ICD-10-CM | POA: Insufficient documentation

## 2018-05-20 DIAGNOSIS — M67911 Unspecified disorder of synovium and tendon, right shoulder: Secondary | ICD-10-CM

## 2018-05-20 DIAGNOSIS — M7711 Lateral epicondylitis, right elbow: Secondary | ICD-10-CM

## 2018-05-20 DIAGNOSIS — M542 Cervicalgia: Secondary | ICD-10-CM | POA: Insufficient documentation

## 2018-05-20 DIAGNOSIS — R202 Paresthesia of skin: Secondary | ICD-10-CM | POA: Insufficient documentation

## 2018-05-20 DIAGNOSIS — Z9049 Acquired absence of other specified parts of digestive tract: Secondary | ICD-10-CM | POA: Insufficient documentation

## 2018-05-20 DIAGNOSIS — M62838 Other muscle spasm: Secondary | ICD-10-CM

## 2018-05-20 MED ORDER — TRAMADOL HCL 50 MG PO TABS: 50.0000 mg | ORAL_TABLET | Freq: Three times a day (TID) | ORAL | 0 refills | Status: DC | PRN

## 2018-05-20 MED ORDER — PREDNISONE 20 MG PO TABS: ORAL_TABLET | ORAL | 1 refills | Status: DC

## 2018-05-20 MED FILL — traMADol HCL 50 MG TABS: 50 | 10 days supply | Qty: 30 | Fill #0

## 2018-05-20 MED FILL — predniSONE 20 MG TABS: 20 | 8 days supply | Qty: 8 | Fill #0

## 2018-05-20 NOTE — Telephone Encounter (Signed)
LVM, Pt was call to inform that we need the 3 month bank statement since she has direct deposit on [id number] acct she has until 05/26/18 to bring the papers

## 2018-05-20 NOTE — Progress Notes (Signed)
  Pain: right arm, 1 mnth gotten worse Monday, collar bone area. 6    Patient stated that when her arm is swollen she has noticed that her right ankle bone is swollen and hurting as well.   Refill: tramadol and amlodipine, and robaxin

## 2018-05-20 NOTE — Progress Notes (Signed)
Subjective:    Patient ID: Sharon Johnson, female    DOB: 1975/11/27, 42 y.o.   MRN: [id number]   Due to a language barrier, a video interpreter system was used at today's visit.  HPI       42 year old female who was seen secondary to complaint of pain in her right arm, shoulder and upper back/neck.  Patient reports that she was recently seen here on 05/16/2018 by another provider..  Patient states that she has had about a month of pain in her right upper back, shoulder, arm and patient sometimes has numbness and tingling in her right hand.  Patient states that she was written out of work after her recent visit here.  Patient was told not to do any heavy lifting.  Patient states that sometimes her work involves wearing a heavy vacuum on her back.  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  Patient states that after again using the vacuum and cleaning as part of her job, patient again had onset of right upper back/shoulder/arm pain.  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  Patient reports that her last visit she was prescribed tramadol which did help.  Patient previously had been taking over-the-counter ibuprofen.  Patient states that her pain had decreased but since recently working, her pain has now increased and is about an 8 on a 0-to-10 scale.  Patient has increased pain with lifting her right arm or attempting to pick up anything.  Past Medical History:  Diagnosis Date  . Hypertension   . Migraine    Past Surgical History:  Procedure Laterality Date  . CHOLECYSTECTOMY N/A 11/10/2017   Procedure: LAPAROSCOPIC CHOLECYSTECTOMY WITH INTRAOPERATIVE CHOLANGIOGRAM;  Surgeon: Luretha Murphy, MD;  Location: [facility];  Service: General;  Laterality: N/A;   Social History   Tobacco Use  . Smoking status: Never Smoker  .  Smokeless tobacco: Never Used  Substance Use Topics  . Alcohol use: No    Alcohol/week: 0.0 standard drinks  . Drug use: No  No Known Allergies    Review of Systems  Constitutional: Positive for fatigue. Negative for chills and fever.  Respiratory: Negative for cough and shortness of breath.   Cardiovascular: Negative for chest pain and palpitations.  Gastrointestinal: Negative for abdominal pain and nausea.  Genitourinary: Negative for flank pain and frequency.  Musculoskeletal: Positive for arthralgias, back pain, myalgias, neck pain and neck stiffness. Negative for gait problem and joint swelling.  Neurological: Negative for dizziness and headaches.       Objective:   Physical Exam BP 130/86   Pulse 71   Temp 98.8 F (37.1 C) (Oral)   Resp 18   Ht 5\' 2"  (1.575 m)   Wt 178 lb (80.7 kg)   LMP 05/03/2018 (Approximate)   SpO2 97%   BMI 32.56 kg/m Nurse's notes and vital signs reviewed General-well-nourished, well-developed female in no acute distress while sitting still but patient did have discomfort/distress with physical exam of the right upper extremity.  Patient is accompanied by 1 of her daughters at today's visit. Neck- patient with some posterior cervical paraspinous spasm right greater than left, no lymphadenopathy, no thyromegaly. Lungs-clear to auscultation bilaterally Cardiovascular-regular rate and rhythm Musculoskeletal- patient with tenderness in the right upper back/trapezius and over the right scapula.  Patient with upper back spasm.  Patient with  tenderness in the posterior and anterior lateral right shoulder and patient with positive empty can/impingement sign on exam.  Patient with tenderness at the right lateral epicondyle of the elbow. Negative Tinel at the right wrist.        Assessment & Plan:  1. Acute pain of right shoulder Patient with recent increase in right shoulder pain after resuming work-related activities.  Patient is provided with refill  of tramadol.  Discussed with patient that she appears to have some rotator cuff tendinopathy.  Patient is being referred to orthopedics for further evaluation and treatment.  Patient was again provided with a note for light duty for the next 2 weeks at work as hopefully this will help improve her current shoulder pain - traMADol (ULTRAM) 50 MG tablet; Take 1 tablet (50 mg total) by mouth every 8 (eight) hours as needed.  Dispense: 30 tablet; Refill: 0 - Ambulatory referral to Orthopedic Surgery  2. Tendinopathy of right rotator cuff Patient is being placed on a prednisone taper to help with muscle and joint inflammation in the right arm and shoulder.  Patient with findings consistent with right rotator cuff tendinopathy and lateral epicondylitis.  Patient is also being referred to orthopedics for further evaluation and treatment - predniSONE (DELTASONE) 20 MG tablet; Take 2 pills once daily for 2 days then 1 pill x 2 days then 1/2 pill for 4 days; take after eating  Dispense: 8 tablet; Refill: 1 - Ambulatory referral to Orthopedic Surgery  3. Right lateral epicondylitis Patient with right lateral epicondylitis and patient will be placed on a prednisone taper.  Patient will follow-up with orthopedics - predniSONE (DELTASONE) 20 MG tablet; Take 2 pills once daily for 2 days then 1 pill x 2 days then 1/2 pill for 4 days; take after eating  Dispense: 8 tablet; Refill: 1 - Ambulatory referral to Orthopedic Surgery  4. Muscle spasms of neck Patient with muscle spasms of the upper back and neck.  Patient is encouraged to use warm moist heat to the areas.  Patient is being placed on a prednisone taper.  Patient may take ibuprofen or tramadol as needed depending on her degree of pain. - predniSONE (DELTASONE) 20 MG tablet; Take 2 pills once daily for 2 days then 1 pill x 2 days then 1/2 pill for 4 days; take after eating  Dispense: 8 tablet; Refill: 1  An After Visit Summary was printed and given to the  patient.  Return if symptoms worsen or fail to improve, for 2 weeks with PCP.  Been

## 2018-06-01 ENCOUNTER — Ambulatory Visit (INDEPENDENT_AMBULATORY_CARE_PROVIDER_SITE_OTHER): Payer: Self-pay | Admitting: Orthopaedic Surgery

## 2018-06-09 MED FILL — ?ATORVASTATIN 20 MG TABLET: 20 | 30 days supply | Qty: 30 | Fill #2

## 2018-06-09 MED FILL — ?AMLODIPINE BESYLATE 5 MG T: 5 MG | 30 days supply | Qty: 30 | Fill #2

## 2018-06-09 MED FILL — predniSONE 20 MG TABS: 20 | 8 days supply | Qty: 8 | Fill #1

## 2018-06-13 ENCOUNTER — Telehealth: Payer: Self-pay | Admitting: Family Medicine

## 2018-06-13 NOTE — Telephone Encounter (Signed)
Please send patient a copy of her [employer]FA letter to her home address when possible, and give a call to inform her of her discount rate.

## 2018-06-14 NOTE — Telephone Encounter (Signed)
Pt was remind that we call her on the 05/20/18 to tell her of the documents we still missing, still waiting for them and we can promise that will be review and approve at this time

## 2018-06-24 ENCOUNTER — Ambulatory Visit: Payer: Self-pay | Admitting: Family Medicine

## 2018-07-14 MED FILL — ?ATORVASTATIN 20 MG TABLET: 20 | 30 days supply | Qty: 30 | Fill #3

## 2018-07-14 MED FILL — ?AMLODIPINE BESYLATE 5 MG T: 5 MG | 30 days supply | Qty: 30 | Fill #3

## 2018-07-21 ENCOUNTER — Ambulatory Visit: Payer: Self-pay | Admitting: Family Medicine

## 2018-07-22 ENCOUNTER — Encounter: Payer: Self-pay | Admitting: Internal Medicine

## 2018-07-22 ENCOUNTER — Ambulatory Visit: Payer: Self-pay | Attending: Internal Medicine | Admitting: Internal Medicine

## 2018-07-22 VITALS — BP 125/78 | HR 63 | Temp 99.1°F | Resp 16 | Wt 175.6 lb

## 2018-07-22 DIAGNOSIS — Z803 Family history of malignant neoplasm of breast: Secondary | ICD-10-CM | POA: Insufficient documentation

## 2018-07-22 DIAGNOSIS — I10 Essential (primary) hypertension: Secondary | ICD-10-CM | POA: Insufficient documentation

## 2018-07-22 DIAGNOSIS — J069 Acute upper respiratory infection, unspecified: Secondary | ICD-10-CM | POA: Insufficient documentation

## 2018-07-22 DIAGNOSIS — Z79899 Other long term (current) drug therapy: Secondary | ICD-10-CM | POA: Insufficient documentation

## 2018-07-22 DIAGNOSIS — Z8249 Family history of ischemic heart disease and other diseases of the circulatory system: Secondary | ICD-10-CM | POA: Insufficient documentation

## 2018-07-22 DIAGNOSIS — Z9049 Acquired absence of other specified parts of digestive tract: Secondary | ICD-10-CM | POA: Insufficient documentation

## 2018-07-22 DIAGNOSIS — H6983 Other specified disorders of Eustachian tube, bilateral: Secondary | ICD-10-CM | POA: Insufficient documentation

## 2018-07-22 DIAGNOSIS — Z793 Long term (current) use of hormonal contraceptives: Secondary | ICD-10-CM | POA: Insufficient documentation

## 2018-07-22 MED ORDER — PROMETHAZINE HCL 12.5 MG PO TABS: 12.5000 mg | ORAL_TABLET | Freq: Three times a day (TID) | ORAL | 0 refills | Status: DC | PRN

## 2018-07-22 MED ORDER — IBUPROFEN 800 MG PO TABS: 800.0000 mg | ORAL_TABLET | Freq: Three times a day (TID) | ORAL | 0 refills | Status: DC | PRN

## 2018-07-22 MED ORDER — PSEUDOEPHEDRINE HCL 30 MG PO TABS: 30.0000 mg | ORAL_TABLET | Freq: Three times a day (TID) | ORAL | 0 refills | Status: DC | PRN

## 2018-07-22 MED FILL — IBUPROFEN 800 MG TABLET: 800 | 20 days supply | Qty: 60 | Fill #0

## 2018-07-22 MED FILL — PROMETHAZINE 12.5 MG TABLET: 12.5 | 4 days supply | Qty: 12 | Fill #0

## 2018-07-22 NOTE — Progress Notes (Signed)
Patient ID: Sharon Johnson, female    DOB: [birth year]  MRN: [id number]  CC: Abdominal Pain and Headache   Subjective: Sharon Johnson is a 43 y.o. female who presents for UC visit Her concerns today include:   Pt c/o rhinorrhea, congestion, headache and ears feeling clogged x 4 days. No cough or fever. No sore throat. Some dizziness and stomach pain last evening.  Felt weak. No diarrhea. Vomited last evening and today.  Regular menses started yesterday -mom sick with URI Patient Active Problem List   Diagnosis Date Noted  . Migraine headache 04/08/2018  . Hypertension 04/08/2018  . SIRS (systemic inflammatory response syndrome) (HCC) 11/08/2017  . Calculus of gallbladder without cholecystitis without obstruction 11/05/2017  . Fatty liver 11/05/2017  . Nausea and vomiting 10/28/2017  . Lower abdominal pain 10/28/2017  . Transaminitis 10/28/2017  . Hyperbilirubinemia 10/28/2017  . Leukocytosis 10/28/2017     Current Outpatient Medications on File Prior to Visit  Medication Sig Dispense Refill  . amLODipine (NORVASC) 5 MG tablet Take 1 tablet (5 mg total) by mouth daily. 90 tablet 3  . atorvastatin (LIPITOR) 20 MG tablet Take 1 tablet (20 mg total) by mouth daily. 30 tablet 6  . levonorgestrel (MIRENA) 20 MCG/24HR IUD 1 each by Intrauterine route once.    . methocarbamol (ROBAXIN) 500 MG tablet Take 1 tablet (500 mg total) by mouth every 8 (eight) hours as needed for muscle spasms. (Patient not taking: Reported on 07/22/2018) 30 tablet 1  . Multiple Vitamins-Minerals (MULTIVITAMIN GUMMIES ADULT) CHEW Chew 1 tablet by mouth daily.    . polyethylene glycol (MIRALAX / GLYCOLAX) packet Take 17 g by mouth daily. (Patient not taking: Reported on 04/08/2018) 14 each 0  . predniSONE (DELTASONE) 20 MG tablet Take 2 pills once daily for 2 days then 1 pill x 2 days then 1/2 pill for 4 days; take after eating (Patient not taking: Reported on 07/22/2018) 8 tablet 1   No current  facility-administered medications on file prior to visit.     No Known Allergies  Social History   Socioeconomic History  . Marital status: Single    Spouse name: Not on file  . Number of children: Not on file  . Years of education: Not on file  . Highest education level: Not on file  Occupational History  . Not on file  Social Needs  . Financial resource strain: Not on file  . Food insecurity:    Worry: Not on file    Inability: Not on file  . Transportation needs:    Medical: Not on file    Non-medical: Not on file  Tobacco Use  . Smoking status: Never Smoker  . Smokeless tobacco: Never Used  Substance and Sexual Activity  . Alcohol use: No    Alcohol/week: 0.0 standard drinks  . Drug use: No  . Sexual activity: Not Currently  Lifestyle  . Physical activity:    Days per week: Not on file    Minutes per session: Not on file  . Stress: Not on file  Relationships  . Social connections:    Talks on phone: Not on file    Gets together: Not on file    Attends religious service: Not on file    Active member of club or organization: Not on file    Attends meetings of clubs or organizations: Not on file    Relationship status: Not on file  . Intimate partner violence:    Fear of  current or ex partner: Not on file    Emotionally abused: Not on file    Physically abused: Not on file    Forced sexual activity: Not on file  Other Topics Concern  . Not on file  Social History Narrative   houskeeping    Family History  Problem Relation Age of Onset  . Hypertension Mother   . Breast cancer Sister     Past Surgical History:  Procedure Laterality Date  . CHOLECYSTECTOMY N/A 11/10/2017   Procedure: LAPAROSCOPIC CHOLECYSTECTOMY WITH INTRAOPERATIVE CHOLANGIOGRAM;  Surgeon: Luretha Murphy, MD;  Location: [facility];  Service: General;  Laterality: N/A;    ROS: Review of Systems Negative except as above PHYSICAL EXAM: BP 125/78   Pulse 63   Temp 99.1 F (37.3 C)  (Oral)   Resp 16   Wt 175 lb 9.6 oz (79.7 kg)   SpO2 97%   BMI 32.12 kg/m    Physical Exam General appearance - alert, well appearing, and in no distress Mental status - normal mood, behavior, speech, dress, motor activity, and thought processes Ears - bilateral TM's and external ear canals normal Mouth - mucous membranes moist, pharynx normal without lesions Neck - supple, no significant adenopathy Chest - clear to auscultation, no wheezes, rales or rhonchi, symmetric air entry Heart - normal rate, regular rhythm, normal S1, S2, no murmurs, rubs, clicks or gallops Abdomen - soft, nontender, nondistended, no masses or organomegaly Neuro -neck is supple.  Cranial nerves grossly intact.  Power 5/5 in all fours.  Gross sensation intact.  ASSESSMENT AND PLAN: 1. Viral upper respiratory illness Symptoms suggest acute viral illness.  I recommend conservative measures.  I have given some Phenergan to use as needed for nausea/vomiting.  She will use ibuprofen or Tylenol for fever - promethazine (PHENERGAN) 12.5 MG tablet; Take 1 tablet (12.5 mg total) by mouth every 8 (eight) hours as needed for nausea or vomiting.  Dispense: 12 tablet; Refill: 0 - ibuprofen (ADVIL,MOTRIN) 800 MG tablet; Take 1 tablet (800 mg total) by mouth every 8 (eight) hours as needed for moderate pain (for pain.).  Dispense: 60 tablet; Refill: 0  2. Dysfunction of both eustachian tubes - pseudoephedrine (SUDAFED) 30 MG tablet; Take 1 tablet (30 mg total) by mouth every 8 (eight) hours as needed for congestion.  Dispense: 12 tablet; Refill: 0   Patient was given the opportunity to ask questions.  Patient verbalized understanding of the plan and was able to repeat key elements of the plan.  [employer] interpreter used during this encounter. #[id number]  No orders of the defined types were placed in this encounter.    Requested Prescriptions   Signed Prescriptions Disp Refills  . promethazine (PHENERGAN) 12.5 MG tablet 12  tablet 0    Sig: Take 1 tablet (12.5 mg total) by mouth every 8 (eight) hours as needed for nausea or vomiting.  Marland Kitchen ibuprofen (ADVIL,MOTRIN) 800 MG tablet 60 tablet 0    Sig: Take 1 tablet (800 mg total) by mouth every 8 (eight) hours as needed for moderate pain (for pain.).  Marland Kitchen pseudoephedrine (SUDAFED) 30 MG tablet 12 tablet 0    Sig: Take 1 tablet (30 mg total) by mouth every 8 (eight) hours as needed for congestion.    No follow-ups on file.  Jonah Blue, MD, FACP

## 2018-07-29 ENCOUNTER — Ambulatory Visit: Payer: Self-pay | Admitting: Family Medicine

## 2018-08-24 MED FILL — AMLODIPINE BESYLATE 5 MG TA: 5 | 30 days supply | Qty: 30 | Fill #4

## 2018-08-24 MED FILL — ?ATORVASTATIN 20 MG TABLET: 20 | 30 days supply | Qty: 30 | Fill #4

## 2018-09-23 MED FILL — AMLODIPINE BESYLATE 5 MG TA: 5 | 90 days supply | Qty: 90 | Fill #5

## 2018-09-23 MED FILL — ATORVASTATIN 20 MG TABLET: 20 | 60 days supply | Qty: 60 | Fill #5

## 2018-11-15 ENCOUNTER — Other Ambulatory Visit: Payer: Self-pay | Admitting: Internal Medicine

## 2018-11-15 DIAGNOSIS — J069 Acute upper respiratory infection, unspecified: Secondary | ICD-10-CM

## 2018-11-15 MED FILL — ?IBUPROFEN 800 MG TABS: AMNEAL | 20 days supply | Qty: 60 | Fill #0

## 2018-12-19 ENCOUNTER — Telehealth: Payer: Self-pay | Admitting: Family Medicine

## 2018-12-19 NOTE — Telephone Encounter (Signed)
Patients call returned.  Patient identified by name and date of birth.  Patient states that for years her right lower back has been bothering her with inflammation and pain.  Patient states that has now calmed down, but the inflammation and pain is now located in t her right foot.  Patient states that this has been present for three weeks.  Patient states the color is pinkish and the foot is swollen.  Patient states she is unable to walk. Pain is 8-9/10.  Patient advised to go to Urgent Care or ED.  Patient states she can not afford that.  Patient states she has a appointment Friday and she would wait for that.   Patient was told that if his condition worsened and/or change for the worse to go to the Emergency Department or Urgent care.  Patient acknowledged understanding of advice.

## 2018-12-19 NOTE — Telephone Encounter (Signed)
Patient called stating she has foot inflammation and pain. Advised to go to the ED or UC.

## 2018-12-19 NOTE — Telephone Encounter (Signed)
Reviewed and will see patient on Friday. She can take OCT tylenol or ibuprofen/naproxen and try soaking her heel in warm water and Epsom salts if she does not have any allergies/intolerances

## 2018-12-23 ENCOUNTER — Ambulatory Visit: Payer: Self-pay | Admitting: Family Medicine

## 2018-12-23 MED FILL — AMLODIPINE BESYLATE 5 MG TA: 5 | 90 days supply | Qty: 90 | Fill #6

## 2019-01-05 ENCOUNTER — Ambulatory Visit: Payer: Self-pay | Attending: Family Medicine

## 2019-01-05 ENCOUNTER — Other Ambulatory Visit: Payer: Self-pay

## 2019-01-17 ENCOUNTER — Telehealth: Payer: Self-pay | Admitting: Family Medicine

## 2019-01-17 NOTE — Telephone Encounter (Signed)
I  Called Pt and LVM informed her that we need the bank statement that show the direct deposit from her work, we need the last 3 month no later that 01/23/19 if we don't received the document [facility] will denied her application and can not reapply for 6 month and she will be responsible for all this bills

## 2019-01-26 ENCOUNTER — Ambulatory Visit: Payer: Self-pay | Admitting: Family Medicine

## 2019-02-23 ENCOUNTER — Telehealth: Payer: Self-pay | Admitting: Family Medicine

## 2019-02-23 NOTE — Telephone Encounter (Signed)
Please contact patient and find out what issues she is having or forward to triage nurse to speak with patient and see if she needs to come in for nurse visit BP check, go to urgent care if COVID sx's or can wait for office visit

## 2019-02-23 NOTE — Telephone Encounter (Signed)
noted 

## 2019-02-23 NOTE — Telephone Encounter (Signed)
Patient called wanting to know if she could speak to someone in [place] to her BP states she is having headaches. Please follow up.

## 2019-02-23 NOTE — Telephone Encounter (Signed)
Patient aware that the appointment is in-person. Screening questions asked on the phone. Has continued problems with body aches since last OV.

## 2019-02-24 ENCOUNTER — Encounter: Payer: Self-pay | Admitting: Family Medicine

## 2019-02-24 ENCOUNTER — Other Ambulatory Visit: Payer: Self-pay

## 2019-02-24 ENCOUNTER — Ambulatory Visit: Payer: Self-pay | Attending: Family Medicine | Admitting: Family Medicine

## 2019-02-24 VITALS — BP 133/89 | HR 73 | Temp 98.9°F | Ht 62.0 in

## 2019-02-24 DIAGNOSIS — I10 Essential (primary) hypertension: Secondary | ICD-10-CM

## 2019-02-24 DIAGNOSIS — M791 Myalgia, unspecified site: Secondary | ICD-10-CM

## 2019-02-24 DIAGNOSIS — R600 Localized edema: Secondary | ICD-10-CM

## 2019-02-24 DIAGNOSIS — R609 Edema, unspecified: Secondary | ICD-10-CM

## 2019-02-24 MED ORDER — IBUPROFEN 600 MG PO TABS: 600.0000 mg | ORAL_TABLET | Freq: Three times a day (TID) | ORAL | 3 refills | Status: DC | PRN

## 2019-02-24 MED ORDER — AMLODIPINE BESYLATE 5 MG PO TABS: 5.0000 mg | ORAL_TABLET | Freq: Every day | ORAL | 1 refills | Status: DC

## 2019-02-24 MED FILL — ?IBUPROFEN 600 MG TABS: 600 | 30 days supply | Qty: 60 | Fill #0

## 2019-02-24 MED FILL — ?AMLODIPINE BESYLATE 5MG TA: 5 | 30 days supply | Qty: 30 | Fill #0

## 2019-02-24 NOTE — Patient Instructions (Addendum)
You can obtain compression hose/support hose at [facility] [address], [place] [address] Edema perifrico Peripheral Edema  El edema perifrico es la hinchazn causada por la acumulacin de lquido. En la [employer], el edema perifrico afecta la parte inferior de las piernas, los tobillos y los pies. Tambin [employer], las manos y Therapist, occupational. La zona del cuerpo que presenta edema perifrico se ver hinchada. Tambin puede sentirse pesada o caliente. Es posible que sienta que la ropa comienza a apretarle. La presin sobre el rea puede dejar una marca temporal en la piel. Tal vez no pueda mover el brazo o la pierna hinchados como lo hace habitualmente. Hay muchas causas posibles de edema perifrico. Puede deberse a una complicacin de otras afecciones, como insuficiencia cardaca congestiva, enfermedad renal o un problema con la circulacin sangunea. Tambin puede ser un efecto secundario de ciertos medicamentos o deberse a una infeccin. Es frecuente Solicitor. A veces, la causa es desconocida. Siga estas instrucciones en su casa: Control del dolor, la rigidez y Teacher, early years/pre las piernas mientras est sentado o recostado.  Muvase con frecuencia para evitar la rigidez y reducir la hinchazn.  No permanezca sentado o de pie durante largos perodos.  Use medias de compresin como le haya indicado su mdico. Medicamentos  Tome los medicamentos de venta libre y los recetados solamente como se lo haya indicado el mdico.  El mdico puede recetarle un medicamento para ayudar a que el cuerpo elimine el exceso de agua (diurtico). Instrucciones generales  Est atento a cualquier cambio en los sntomas.  Siga las instrucciones del mdico respecto de las restricciones en el consumo de sal (sodio) en la dieta. A veces, disminuir el consumo de sal puede reducir la hinchazn.  Humctese la piel todos los das para evitar que se agriete y se  seque.  Concurra a todas las visitas de seguimiento como se lo haya indicado el mdico. Esto es importante. Comunquese con un mdico si tiene:  Fiebre.  Edema que aparece de forma repentina o empeora, en especial si usted est embarazada o tiene una enfermedad.  Hinchazn en una sola pierna.  Aumento de la hinchazn, el enrojecimiento o Conservation officer, historic buildings en una pierna o en ambas.  Secrecin o llagas en la zona donde tiene edema. Solicite ayuda inmediatamente si:  Le falta el aire, especialmente al [employer].  Tiene dolor en el pecho o el abdomen.  Se siente dbil.  Siente que va a desmayarse. Resumen  El edema perifrico es la hinchazn causada por la acumulacin de lquido. En la [employer], el edema perifrico afecta la parte inferior de las piernas, los tobillos y los pies.  Muvase con frecuencia para evitar la rigidez y reducir la hinchazn. No permanezca sentado o de pie durante largos perodos.  Est atento a cualquier cambio en los sntomas.  Comunquese con un mdico si tiene un edema que aparece de forma repentina o empeora, en especial si usted est embarazada o tiene una afeccin mdica.  Busque ayuda de inmediato si le falta el aire, especialmente al [employer]. Esta informacin no tiene Marine scientist el consejo del mdico. Asegrese de hacerle al mdico cualquier pregunta que tenga. Document Released: 07/06/2005 Document Revised: 05/06/2018 Document Reviewed: 05/06/2018 Elsevier Patient Education  2020 [employer].

## 2019-02-24 NOTE — Progress Notes (Signed)
Established Patient Office Visit  Subjective:  Patient ID: Sharon BirksHilda D Johnson, female    DOB: [birth year]  Age: 43 y.o. MRN: [id number]  CC: No chief complaint on file.   HPI Sharon BirksHilda D Johnson presents for follow-up of hypertension and she states that she needs a refill of her medications.  She believes that her blood pressure has been controlled on her current medication and she denies any headaches or dizziness related to her blood pressure.  She however believes that she has some increase in swelling in her ankles and feet by the end of the day.  So far, swelling does go away overnight.  Patient also continues to have some issues with generalized muscle aches usually in the upper to mid back if she states that her work involves a lot of repetitive motions as well as lifting.  She currently works in housekeeping.  Past Medical History:  Diagnosis Date  . Hypertension   . Migraine     Past Surgical History:  Procedure Laterality Date  . CHOLECYSTECTOMY N/A 11/10/2017   Procedure: LAPAROSCOPIC CHOLECYSTECTOMY WITH INTRAOPERATIVE CHOLANGIOGRAM;  Surgeon: Luretha MurphyMartin, Matthew, MD;  Location: [facility];  Service: General;  Laterality: N/A;    Family History  Problem Relation Age of Onset  . Hypertension Mother   . Breast cancer Sister     Social History   Socioeconomic History  . Marital status: Single    Spouse name: Not on file  . Number of children: Not on file  . Years of education: Not on file  . Highest education level: Not on file  Occupational History  . Not on file  Social Needs  . Financial resource strain: Not on file  . Food insecurity    Worry: Not on file    Inability: Not on file  . Transportation needs    Medical: Not on file    Non-medical: Not on file  Tobacco Use  . Smoking status: Never Smoker  . Smokeless tobacco: Never Used  Substance and Sexual Activity  . Alcohol use: No    Alcohol/week: 0.0 standard drinks  . Drug use: No  . Sexual  activity: Not Currently  Lifestyle  . Physical activity    Days per week: Not on file    Minutes per session: Not on file  . Stress: Not on file  Relationships  . Social Musicianconnections    Talks on phone: Not on file    Gets together: Not on file    Attends religious service: Not on file    Active member of club or organization: Not on file    Attends meetings of clubs or organizations: Not on file    Relationship status: Not on file  . Intimate partner violence    Fear of current or ex partner: Not on file    Emotionally abused: Not on file    Physically abused: Not on file    Forced sexual activity: Not on file  Other Topics Concern  . Not on file  Social History Narrative   houskeeping    Outpatient Medications Prior to Visit  Medication Sig Dispense Refill  . amLODipine (NORVASC) 5 MG tablet Take 1 tablet (5 mg total) by mouth daily. 90 tablet 3  . atorvastatin (LIPITOR) 20 MG tablet Take 1 tablet (20 mg total) by mouth daily. 30 tablet 6  . ibuprofen (ADVIL) 800 MG tablet TAKE 1 TABLET BY MOUTH EVERY 8 HOURS AS AS NEEDED FOR PAIN 60 tablet 0  . levonorgestrel (  MIRENA) 20 MCG/24HR IUD 1 each by Intrauterine route once.    . methocarbamol (ROBAXIN) 500 MG tablet Take 1 tablet (500 mg total) by mouth every 8 (eight) hours as needed for muscle spasms. (Patient not taking: Reported on 07/22/2018) 30 tablet 1  . Multiple Vitamins-Minerals (MULTIVITAMIN GUMMIES ADULT) CHEW Chew 1 tablet by mouth daily.    . polyethylene glycol (MIRALAX / GLYCOLAX) packet Take 17 g by mouth daily. (Patient not taking: Reported on 04/08/2018) 14 each 0  . predniSONE (DELTASONE) 20 MG tablet Take 2 pills once daily for 2 days then 1 pill x 2 days then 1/2 pill for 4 days; take after eating (Patient not taking: Reported on 07/22/2018) 8 tablet 1  . promethazine (PHENERGAN) 12.5 MG tablet Take 1 tablet (12.5 mg total) by mouth every 8 (eight) hours as needed for nausea or vomiting. 12 tablet 0  . pseudoephedrine  (SUDAFED) 30 MG tablet Take 1 tablet (30 mg total) by mouth every 8 (eight) hours as needed for congestion. 12 tablet 0   No facility-administered medications prior to visit.     No Known Allergies  ROS Review of Systems  Constitutional: Negative for chills, fatigue and fever.  HENT: Negative for congestion, sore throat and trouble swallowing.   Respiratory: Negative for cough and shortness of breath.   Cardiovascular: Positive for leg swelling. Negative for chest pain and palpitations.  Gastrointestinal: Negative for abdominal pain, blood in stool, constipation and diarrhea.  Endocrine: Negative for cold intolerance, heat intolerance, polydipsia, polyphagia and polyuria.  Genitourinary: Negative for dysuria and frequency.  Musculoskeletal: Positive for arthralgias, back pain (Upper to mid back; occasional low back pain without radiation) and myalgias. Negative for gait problem and joint swelling.  Neurological: Negative for dizziness and headaches.  Hematological: Negative for adenopathy. Does not bruise/bleed easily.      Objective:    Physical Exam  Constitutional: She is oriented to person, place, and time. She appears well-developed and well-nourished.  Neck: Normal range of motion. Neck supple. No JVD present.  Cardiovascular: Normal rate and regular rhythm.  Pulmonary/Chest: Effort normal and breath sounds normal.  Abdominal: Soft. There is no abdominal tenderness. There is no rebound and no guarding.  Musculoskeletal:        General: Tenderness present. No edema.     Comments: Patient with some mild upper back/trapezius area spasm as well as mild thoracolumbar paraspinous spasm with some mild tenderness to palpation along the upper back and midline lumbosacral area  Lymphadenopathy:    She has no cervical adenopathy.  Neurological: She is alert and oriented to person, place, and time.  Skin: Skin is warm and dry.  Psychiatric: She has a normal mood and affect. Her  behavior is normal.  Nursing note and vitals reviewed.   BP 133/89 (BP Location: Right Arm, Patient Position: Sitting, Cuff Size: Large)   Pulse 73   Temp 98.9 F (37.2 C) (Oral)   Ht 5\' 2"  (1.575 m)   SpO2 97%   Breastfeeding No   BMI 32.12 kg/m  Wt Readings from Last 3 Encounters:  07/22/18 175 lb 9.6 oz (79.7 kg)  05/20/18 178 lb (80.7 kg)  05/16/18 177 lb 3.2 oz (80.4 kg)     Health Maintenance Due  Topic Date Due  . TETANUS/TDAP  09/06/1994  . PAP SMEAR-Modifier  09/06/1996  . INFLUENZA VACCINE  02/18/2019    Lab Results  Component Value Date   TSH 2.000 11/09/2017   Lab Results  Component Value  Date   WBC 7.9 11/13/2017   HGB 12.0 11/13/2017   HCT 36.7 11/13/2017   MCV 90.6 11/13/2017   PLT 356 11/13/2017   Lab Results  Component Value Date   NA 141 11/13/2017   K 4.1 11/13/2017   CO2 23 11/13/2017   GLUCOSE 91 11/13/2017   BUN 13 11/13/2017   CREATININE 0.48 11/13/2017   BILITOT 0.4 11/13/2017   ALKPHOS 102 11/13/2017   AST 35 11/13/2017   ALT 124 (H) 11/13/2017   PROT 6.4 (L) 11/13/2017   ALBUMIN 3.5 11/13/2017   CALCIUM 9.4 11/13/2017   ANIONGAP 12 11/13/2017   No results found for: CHOL No results found for: HDL No results found for: LDLCALC No results found for: TRIG No results found for: [facility]OLHDL Lab Results  Component Value Date   HGBA1C 5.2 11/09/2017      Assessment & Plan:  1. Essential hypertension Patient's blood pressures currently controlled on amlodipine.  Low-salt diet/DASH diet encouraged.  Patient with complaint of some lower extremity edema but does not have any significant edema today's visit.  If edema worsens, then consideration can be given to adding low-dose diuretic such as hydrochlorothiazide and or changing medication from amlodipine as some people will get swelling with use of amlodipine but normally at higher doses but as this medication does cause vasodilatation/relaxation of blood vessels it can contribute to  edema. - amLODipine (NORVASC) 5 MG tablet; Take 1 tablet (5 mg total) by mouth daily.  Dispense: 90 tablet; Refill: 1  2. Mild peripheral edema Patient does not have any significant peripheral edema at today's visit.  Discussed with patient that she should watch her salt intake and she may also want to wear compressive stockings which she can obtain over-the-counter at a local pharmacy or medical supply store to help with swelling.  She should wear the compressive stockings/socks during work hours and take them off when she is at home or before bedtime.  She can also keep her legs elevated as needed when swelling occurs.  3. Myalgia Patient is encouraged to use warm moist heat to the areas of discomfort and she can take over-the-counter medications as needed such as ibuprofen or naproxen but should try not to take these every day as this may also contribute to elevated blood pressure or increased risk for renal injury and/or gastritis/GI bleed.  Patient also has prescription for Robaxin that she can take as needed for muscle spasm.  Patient request prescription for ibuprofen to take as needed for more severe pain as she has taken ibuprofen 800 in the past.  Patient given prescription for ibuprofen 600 which is the dose available through this pharmacy.  She is aware that she should report any issues with abdominal pain or blood in the stool/dark stools or seek medical attention.  An After Visit Summary was printed and given to the patient.  Follow-up: Return in about 5 months (around 07/27/2019) for hypertension.   Cain Saupeammie Keairra Bardon, MD

## 2019-03-17 ENCOUNTER — Encounter: Payer: Self-pay | Admitting: Family Medicine

## 2019-04-18 ENCOUNTER — Emergency Department (HOSPITAL_BASED_OUTPATIENT_CLINIC_OR_DEPARTMENT_OTHER): Payer: Worker's Compensation

## 2019-04-18 ENCOUNTER — Other Ambulatory Visit: Payer: Self-pay

## 2019-04-18 ENCOUNTER — Emergency Department (HOSPITAL_BASED_OUTPATIENT_CLINIC_OR_DEPARTMENT_OTHER)
Admission: EM | Admit: 2019-04-18 | Discharge: 2019-04-18 | Disposition: A | Discharge status: Home / Self Care | Payer: Worker's Compensation | Attending: Emergency Medicine | Admitting: Emergency Medicine

## 2019-04-18 ENCOUNTER — Encounter (HOSPITAL_BASED_OUTPATIENT_CLINIC_OR_DEPARTMENT_OTHER): Payer: Self-pay | Admitting: *Deleted

## 2019-04-18 DIAGNOSIS — Z79899 Other long term (current) drug therapy: Secondary | ICD-10-CM | POA: Diagnosis not present

## 2019-04-18 DIAGNOSIS — S82832A Other fracture of upper and lower end of left fibula, initial encounter for closed fracture: Secondary | ICD-10-CM | POA: Diagnosis not present

## 2019-04-18 DIAGNOSIS — Y929 Unspecified place or not applicable: Secondary | ICD-10-CM | POA: Diagnosis not present

## 2019-04-18 DIAGNOSIS — Y998 Other external cause status: Secondary | ICD-10-CM | POA: Insufficient documentation

## 2019-04-18 DIAGNOSIS — Y9301 Activity, walking, marching and hiking: Secondary | ICD-10-CM | POA: Diagnosis not present

## 2019-04-18 DIAGNOSIS — I10 Essential (primary) hypertension: Secondary | ICD-10-CM | POA: Diagnosis not present

## 2019-04-18 DIAGNOSIS — W19XXXA Unspecified fall, initial encounter: Secondary | ICD-10-CM | POA: Insufficient documentation

## 2019-04-18 DIAGNOSIS — S99912A Unspecified injury of left ankle, initial encounter: Secondary | ICD-10-CM | POA: Diagnosis present

## 2019-04-18 MED ORDER — OXYCODONE-ACETAMINOPHEN 5-325 MG PO TABS
1.0000 | ORAL_TABLET | Freq: Once | ORAL | Status: AC
  Administered 2019-04-18: 14:00:00 1 via ORAL

## 2019-04-18 MED ORDER — OXYCODONE-ACETAMINOPHEN 5-325 MG PO TABS
ORAL_TABLET | ORAL | Status: AC
  Filled 2019-04-18: qty 1

## 2019-04-18 MED ORDER — OXYCODONE-ACETAMINOPHEN 5-325 MG PO TABS: 1.0000 | ORAL_TABLET | Freq: Four times a day (QID) | ORAL | 0 refills | Status: AC | PRN

## 2019-04-18 NOTE — ED Triage Notes (Signed)
Pt c/o fall x 1 hr ago c/o left ankle injury

## 2019-04-18 NOTE — ED Provider Notes (Addendum)
[facility] EMERGENCY DEPARTMENT Provider Note   CSN: [id number] Arrival date & time: 04/18/19  1356     History   Chief Complaint Chief Complaint  Patient presents with  . Ankle Injury    HPI Sharon Johnson is a 43 y.o. female.     Patient is a 43 year old female with past medical history of hypertension presenting to the emergency department for left ankle pain after fall.  Patient reports she was walking and accidentally inverted her ankle.  She fell to the ground.  Did not hit her head or pass out.  Reports unable to bear weight on her left ankle.  No other injuries.     Past Medical History:  Diagnosis Date  . Hypertension   . Migraine     Patient Active Problem List   Diagnosis Date Noted  . Migraine headache 04/08/2018  . Hypertension 04/08/2018  . SIRS (systemic inflammatory response syndrome) ([place]) 11/08/2017  . Calculus of gallbladder without cholecystitis without obstruction 11/05/2017  . Fatty liver 11/05/2017  . Nausea and vomiting 10/28/2017  . Lower abdominal pain 10/28/2017  . Transaminitis 10/28/2017  . Hyperbilirubinemia 10/28/2017  . Leukocytosis 10/28/2017    Past Surgical History:  Procedure Laterality Date  . CHOLECYSTECTOMY N/A 11/10/2017   Procedure: LAPAROSCOPIC CHOLECYSTECTOMY WITH INTRAOPERATIVE CHOLANGIOGRAM;  Surgeon: Johnathan Hausen, MD;  Location: [facility];  Service: General;  Laterality: N/A;     OB History    Gravida  1   Para      Term      Preterm      AB      Living        SAB      TAB      Ectopic      Multiple      Live Births               Home Medications    Prior to Admission medications   Medication Sig Start Date End Date Taking? Authorizing Provider  amLODipine (NORVASC) 5 MG tablet Take 1 tablet (5 mg total) by mouth daily. 02/24/19   Fulp, Cammie, MD  atorvastatin (LIPITOR) 20 MG tablet Take 1 tablet (20 mg total) by mouth daily. 04/08/18   Fulp, Cammie, MD  ibuprofen  (ADVIL) 600 MG tablet Take 1 tablet (600 mg total) by mouth every 8 (eight) hours as needed. For pain; take after eating 02/24/19   Fulp, Cammie, MD  levonorgestrel (MIRENA) 20 MCG/24HR IUD 1 each by Intrauterine route once.    [provider]  methocarbamol (ROBAXIN) 500 MG tablet Take 1 tablet (500 mg total) by mouth every 8 (eight) hours as needed for muscle spasms. Patient not taking: Reported on 07/22/2018 04/08/18   Antony Blackbird, MD  Multiple Vitamins-Minerals (MULTIVITAMIN GUMMIES ADULT) CHEW Chew 1 tablet by mouth daily.    [provider]  oxyCODONE-acetaminophen (PERCOCET/ROXICET) 5-325 MG tablet Take 1 tablet by mouth every 6 (six) hours as needed for up to 3 days for severe pain. 04/18/19 04/21/19  Madilyn Hook A, PA-C  polyethylene glycol (MIRALAX / GLYCOLAX) packet Take 17 g by mouth daily. Patient not taking: Reported on 04/08/2018 11/14/17   Lavina Hamman, MD  predniSONE (DELTASONE) 20 MG tablet Take 2 pills once daily for 2 days then 1 pill x 2 days then 1/2 pill for 4 days; take after eating Patient not taking: Reported on 07/22/2018 05/20/18   Fulp, Ander Gaster, MD  promethazine (PHENERGAN) 12.5 MG tablet Take 1 tablet (  12.5 mg total) by mouth every 8 (eight) hours as needed for nausea or vomiting. 07/22/18   Marcine Matar, MD  pseudoephedrine (SUDAFED) 30 MG tablet Take 1 tablet (30 mg total) by mouth every 8 (eight) hours as needed for congestion. Patient not taking: Reported on 02/24/2019 07/22/18   Marcine Matar, MD    Family History Family History  Problem Relation Age of Onset  . Hypertension Mother   . Breast cancer Sister     Social History Social History   Tobacco Use  . Smoking status: Never Smoker  . Smokeless tobacco: Never Used  Substance Use Topics  . Alcohol use: No    Alcohol/week: 0.0 standard drinks  . Drug use: No     Allergies   Patient has no known allergies.   Review of Systems Review of Systems  Constitutional: Negative for  fever.  Musculoskeletal: Positive for arthralgias and joint swelling. Negative for back pain, gait problem and myalgias.  Skin: Negative for rash and wound.  Allergic/Immunologic: Negative for immunocompromised state.  Neurological: Negative for dizziness and light-headedness.  Hematological: Does not bruise/bleed easily.  All other systems reviewed and are negative.    Physical Exam Updated Vital Signs BP 133/77 (BP Location: Right Arm)   Pulse 85   Resp 14   Ht  (1.575 m)   Wt 78.5 kg   SpO2 99%   BMI 31.64 kg/m   Physical Exam Vitals signs and nursing note reviewed.  Constitutional:      Appearance: Normal appearance.  HENT:     Head: Normocephalic.  Eyes:     Conjunctiva/sclera: Conjunctivae normal.  Pulmonary:     Effort: Pulmonary effort is normal.  Musculoskeletal:     Left ankle: She exhibits decreased range of motion and swelling. She exhibits no ecchymosis, no deformity, no laceration and normal pulse. Tenderness. Lateral malleolus tenderness found. No AITFL, no CF ligament, no posterior TFL and no head of 5th metatarsal tenderness found. Achilles tendon exhibits no pain and no defect.     Comments: Patient is neurovascularly intact.  Has significant pain with any range of motion of the left ankle.  Skin:    General: Skin is dry.     Capillary Refill: Capillary refill takes less than 2 seconds.     Comments: No ecchymosis.  Lateral left ankle swelling.  Neurological:     Mental Status: She is alert.  Psychiatric:        Mood and Affect: Mood normal.      ED Treatments / Results  Labs (all labs ordered are listed, but only abnormal results are displayed) Labs Reviewed - No data to display  EKG None  Radiology Dg Ankle Complete Left  Result Date: 04/18/2019 CLINICAL DATA:  Recent fall today with lateral ankle pain, initial encounter EXAM: LEFT ANKLE COMPLETE - 3+ VIEW COMPARISON:  None. FINDINGS: Oblique fracture is noted through the distal  fibular metaphysis with associated soft tissue swelling. No other fracture is noted. IMPRESSION: Distal fibular fracture with soft tissue swelling. Electronically Signed   By: Alcide Clever M.D.   On: 04/18/2019 14:50   Dg Foot Complete Left  Result Date: 04/18/2019 CLINICAL DATA:  Larey Seat today.  Left foot pain. EXAM: LEFT FOOT - COMPLETE 3+ VIEW COMPARISON:  None. FINDINGS: The joint spaces are maintained. No acute fracture is identified. Cortical thickening involving the midshaft region of the proximal phalanx of the third toe is likely a remote healed fracture. On the lateral film  there is a small fracture involving the posterior malleolus and also a fibular fracture. IMPRESSION: Ankle fractures but no acute foot fracture. Probable remote healed third proximal phalanx fracture. Electronically Signed   By: Rudie MeyerP.  Gallerani M.D.   On: 04/18/2019 14:49    Procedures .Splint Application  Date/Time: 04/18/2019 4:48 PM Performed by: Arlyn DunningMcLean, Leana Springston A, PA-C Authorized by: Arlyn DunningMcLean, Kimisha Eunice A, PA-C   Consent:    Consent obtained:  Verbal   Consent given by:  Patient   Risks discussed:  Discoloration, numbness, pain and swelling   Alternatives discussed:  Alternative treatment Pre-procedure details:    Sensation:  Normal Procedure details:    Laterality:  Left   Location:  Leg   Leg:  L lower leg   Strapping: no     Cast type:  Short leg   Splint type:  Short leg   Supplies:  Elastic bandage, cotton padding and Ortho-Glass Post-procedure details:    Pain:  Improved   Sensation:  Normal   Patient tolerance of procedure:  Tolerated well, no immediate complications   (including critical care time)  Medications Ordered in ED Medications  oxyCODONE-acetaminophen (PERCOCET/ROXICET) 5-325 MG per tablet (has no administration in time range)  oxyCODONE-acetaminophen (PERCOCET/ROXICET) 5-325 MG per tablet 1 tablet (1 tablet Oral Given 04/18/19 1417)     Initial Impression / Assessment and Plan / ED  Course  I have reviewed the triage vital signs and the nursing notes.  Pertinent labs & imaging results that were available during my care of the patient were reviewed by me and considered in my medical decision making (see chart for details).  Clinical Course as of Apr 17 1648  Tue Apr 18, 2019  1502 Oblique fracture is noted through the distal fibular metaphysis. No associated foot fracture.  Neurovascularly intact.  Patient placed in splint, given crutches and oxycodone.  Advised to follow-up with orthopedics.   [KM]  1505 I discussed treatment options for the patients pain to include narcotic and non-narcotic medications. I discussed the risks of narcotic medications in full detail to include overdose and addiction. Patient verbalized full understanding of risks of narcotic medication but still insisted to take rx for narcotic pain medication due to the severity of their pain.  Prior to providing a prescription for a controlled substance, I independently reviewed the patient's recent prescription history on the [place] Controlled Substance Reporting System. The patient had no recent or regular prescriptions and was deemed appropriate for a brief, less than 3 day prescription of narcotic for acute analgesia.     [KM]    Clinical Course User Index [KM] Arlyn DunningMcLean, Demani Mcbrien A, PA-C       Counseled pt on good return precautions and encouraged both PCP and ED follow-up as needed.  Prior to discharge, I also discussed incidental imaging findings with patient in detail and advised appropriate, recommended follow-up in detail.  Clinical Impression: 1. Other closed fracture of distal end of left fibula, initial encounter     Disposition: Discharge  Prior to providing a prescription for a controlled substance, I independently reviewed the patient's recent prescription history on the [place] Controlled Substance Reporting System. The patient had no recent or regular prescriptions and  was deemed appropriate for a brief, less than 3 day prescription of narcotic for acute analgesia.  This note was prepared with assistance of Conservation officer, historic [employer]agon voice recognition software. Occasional wrong-word or sound-a-like substitutions may have occurred due to the inherent limitations of voice recognition software.   Final  Clinical Impressions(s) / ED Diagnoses   Final diagnoses:  Other closed fracture of distal end of left fibula, initial encounter    ED Discharge Orders         Ordered    oxyCODONE-acetaminophen (PERCOCET/ROXICET) 5-325 MG tablet  Every 6 hours PRN     04/18/19 1506           Jeral Pinch 04/18/19 1507    Tegeler, Canary Brim, MD 04/18/19 1530    Arlyn Dunning, PA-C 04/18/19 1649    Tegeler, Canary Brim, MD 04/19/19 0730

## 2019-04-18 NOTE — Discharge Instructions (Addendum)
Thank you for allowing me to care for you today. Please return to the emergency department if you have new or worsening symptoms. Take your medications as instructed.    PATIENT IS TO STAY OUT OF WORK UNTIL FOLLOW UP WITH ORTHOPEDICS

## 2019-04-20 ENCOUNTER — Encounter (HOSPITAL_BASED_OUTPATIENT_CLINIC_OR_DEPARTMENT_OTHER): Payer: Self-pay | Admitting: *Deleted

## 2019-04-20 ENCOUNTER — Emergency Department (HOSPITAL_BASED_OUTPATIENT_CLINIC_OR_DEPARTMENT_OTHER)
Admission: EM | Admit: 2019-04-20 | Discharge: 2019-04-20 | Disposition: A | Discharge status: Home / Self Care | Payer: Worker's Compensation | Attending: Emergency Medicine | Admitting: Emergency Medicine

## 2019-04-20 ENCOUNTER — Other Ambulatory Visit: Payer: Self-pay

## 2019-04-20 DIAGNOSIS — X58XXXD Exposure to other specified factors, subsequent encounter: Secondary | ICD-10-CM | POA: Insufficient documentation

## 2019-04-20 DIAGNOSIS — I10 Essential (primary) hypertension: Secondary | ICD-10-CM | POA: Diagnosis not present

## 2019-04-20 DIAGNOSIS — N3000 Acute cystitis without hematuria: Secondary | ICD-10-CM | POA: Insufficient documentation

## 2019-04-20 DIAGNOSIS — S82832A Other fracture of upper and lower end of left fibula, initial encounter for closed fracture: Secondary | ICD-10-CM

## 2019-04-20 DIAGNOSIS — S89302D Unspecified physeal fracture of lower end of left fibula, subsequent encounter for fracture with routine healing: Secondary | ICD-10-CM | POA: Diagnosis not present

## 2019-04-20 DIAGNOSIS — Z79899 Other long term (current) drug therapy: Secondary | ICD-10-CM | POA: Diagnosis not present

## 2019-04-20 DIAGNOSIS — G8918 Other acute postprocedural pain: Secondary | ICD-10-CM | POA: Diagnosis present

## 2019-04-20 LAB — URINALYSIS, ROUTINE W REFLEX MICROSCOPIC
Bilirubin Urine: NEGATIVE
Glucose, UA: NEGATIVE mg/dL
Ketones, ur: NEGATIVE mg/dL
Nitrite: POSITIVE — AB
Protein, ur: NEGATIVE mg/dL
Specific Gravity, Urine: <1.005 — ABNORMAL LOW (ref 1.005–1.030)
pH: 7 (ref 5.0–8.0)

## 2019-04-20 LAB — URINALYSIS, MICROSCOPIC (REFLEX)

## 2019-04-20 MED ORDER — CEPHALEXIN 500 MG PO CAPS: 500.0000 mg | ORAL_CAPSULE | Freq: Two times a day (BID) | ORAL | 0 refills | Status: DC

## 2019-04-20 MED ORDER — OXYCODONE-ACETAMINOPHEN 5-325 MG PO TABS: 1.0000 | ORAL_TABLET | Freq: Four times a day (QID) | ORAL | 0 refills | Status: DC | PRN

## 2019-04-20 MED ORDER — CEPHALEXIN 250 MG PO CAPS
500.0000 mg | ORAL_CAPSULE | Freq: Once | ORAL | Status: AC
  Administered 2019-04-20: 19:00:00 500 mg via ORAL
  Filled 2019-04-20: qty 2

## 2019-04-20 NOTE — Discharge Instructions (Signed)
Follow-up with who you are seeing for the Workmen's Comp. issue of your lower leg.  Return here as needed.  Make sure that she did not get the splint wet.

## 2019-04-20 NOTE — ED Provider Notes (Signed)
[facility] EMERGENCY DEPARTMENT Provider Note   CSN: [id number] Arrival date & time: 04/20/19  1650     History   Chief Complaint Chief Complaint  Patient presents with  . Foot Pain    HPI Sharon Johnson is a 43 y.o. female.     HPI Patient presents to the emergency department with reevaluation of her distal fibular fracture.  The patient states that she has run out of her pain medication and feels that she needs some further management.  Patient states she is also had some pain over her bladder that started last night.  Patient states that she got her splint wet as well.  Patient has no other complaints. Past Medical History:  Diagnosis Date  . Hypertension   . Migraine     Patient Active Problem List   Diagnosis Date Noted  . Migraine headache 04/08/2018  . Hypertension 04/08/2018  . SIRS (systemic inflammatory response syndrome) ([place]) 11/08/2017  . Calculus of gallbladder without cholecystitis without obstruction 11/05/2017  . Fatty liver 11/05/2017  . Nausea and vomiting 10/28/2017  . Lower abdominal pain 10/28/2017  . Transaminitis 10/28/2017  . Hyperbilirubinemia 10/28/2017  . Leukocytosis 10/28/2017    Past Surgical History:  Procedure Laterality Date  . CHOLECYSTECTOMY N/A 11/10/2017   Procedure: LAPAROSCOPIC CHOLECYSTECTOMY WITH INTRAOPERATIVE CHOLANGIOGRAM;  Surgeon: Johnathan Hausen, MD;  Location: [facility];  Service: General;  Laterality: N/A;     OB History    Gravida  1   Para      Term      Preterm      AB      Living        SAB      TAB      Ectopic      Multiple      Live Births               Home Medications    Prior to Admission medications   Medication Sig Start Date End Date Taking? Authorizing Provider  amLODipine (NORVASC) 5 MG tablet Take 1 tablet (5 mg total) by mouth daily. 02/24/19   Fulp, Cammie, MD  atorvastatin (LIPITOR) 20 MG tablet Take 1 tablet (20 mg total) by mouth daily. 04/08/18    Fulp, Cammie, MD  ibuprofen (ADVIL) 600 MG tablet Take 1 tablet (600 mg total) by mouth every 8 (eight) hours as needed. For pain; take after eating 02/24/19   Fulp, Cammie, MD  levonorgestrel (MIRENA) 20 MCG/24HR IUD 1 each by Intrauterine route once.    [provider]  methocarbamol (ROBAXIN) 500 MG tablet Take 1 tablet (500 mg total) by mouth every 8 (eight) hours as needed for muscle spasms. Patient not taking: Reported on 07/22/2018 04/08/18   Antony Blackbird, MD  Multiple Vitamins-Minerals (MULTIVITAMIN GUMMIES ADULT) CHEW Chew 1 tablet by mouth daily.    [provider]  oxyCODONE-acetaminophen (PERCOCET/ROXICET) 5-325 MG tablet Take 1 tablet by mouth every 6 (six) hours as needed for up to 3 days for severe pain. 04/18/19 04/21/19  Madilyn Hook A, PA-C  polyethylene glycol (MIRALAX / GLYCOLAX) packet Take 17 g by mouth daily. Patient not taking: Reported on 04/08/2018 11/14/17   Lavina Hamman, MD  predniSONE (DELTASONE) 20 MG tablet Take 2 pills once daily for 2 days then 1 pill x 2 days then 1/2 pill for 4 days; take after eating Patient not taking: Reported on 07/22/2018 05/20/18   Fulp, Ander Gaster, MD  promethazine (PHENERGAN) 12.5 MG tablet Take  1 tablet (12.5 mg total) by mouth every 8 (eight) hours as needed for nausea or vomiting. 07/22/18   Marcine Matar, MD  pseudoephedrine (SUDAFED) 30 MG tablet Take 1 tablet (30 mg total) by mouth every 8 (eight) hours as needed for congestion. Patient not taking: Reported on 02/24/2019 07/22/18   Marcine Matar, MD    Family History Family History  Problem Relation Age of Onset  . Hypertension Mother   . Breast cancer Sister     Social History Social History   Tobacco Use  . Smoking status: Never Smoker  . Smokeless tobacco: Never Used  Substance Use Topics  . Alcohol use: No    Alcohol/week: 0.0 standard drinks  . Drug use: No     Allergies   Patient has no known allergies.   Review of Systems Review of Systems All  other systems negative except as documented in the HPI. All pertinent positives and negatives as reviewed in the HPI.  Physical Exam Updated Vital Signs BP (!) 156/80   Pulse 88   Temp 98 F (36.7 C) (Oral)   Resp 16   SpO2 100%   Physical Exam Vitals signs and nursing note reviewed.  Constitutional:      General: She is not in acute distress.    Appearance: She is well-developed.  HENT:     Head: Normocephalic and atraumatic.  Eyes:     Pupils: Pupils are equal, round, and reactive to light.  Pulmonary:     Effort: Pulmonary effort is normal.  Abdominal:     Tenderness: There is abdominal tenderness in the suprapubic area.  Skin:    General: Skin is warm and dry.  Neurological:     Mental Status: She is alert and oriented to person, place, and time.      ED Treatments / Results  Labs (all labs ordered are listed, but only abnormal results are displayed) Labs Reviewed - No data to display  EKG None  Radiology No results found.  Procedures Procedures (including critical care time)  Medications Ordered in ED Medications - No data to display   Initial Impression / Assessment and Plan / ED Course  I have reviewed the triage vital signs and the nursing notes.  Pertinent labs & imaging results that were available during my care of the patient were reviewed by me and considered in my medical decision making (see chart for details).        We will check a urinalysis to ensure that she does not have a urinary tract infection based on her suprapubic pain and back pain.  Patient has had this in the past as well.  We will replace her splint and have her follow-up with orthopedics.  Final Clinical Impressions(s) / ED Diagnoses   Final diagnoses:  None    ED Discharge Orders    None       Charlestine Night, PA-C 04/20/19 1729    Rolan Bucco, MD 04/20/19 [phone]

## 2019-04-20 NOTE — ED Triage Notes (Signed)
Pt fractured her ankle two days ago and has returned here due to continued pain.  Pt also notes that she showered this am and got her bandages wet.  Pt has used the pain medications that she was given.  Pt is reporting her pain is 10/10.  Pt reports that she has pain in left ankle and up the left leg as well as back pain.

## 2019-04-20 NOTE — ED Notes (Signed)
Pt states that she has been elevating her leg but has not been taking ibuprofen or other pain medication since running out of her prescription medication she was given for pain.

## 2019-04-25 ENCOUNTER — Other Ambulatory Visit: Payer: Self-pay

## 2019-04-25 DIAGNOSIS — Z20822 Contact with and (suspected) exposure to covid-19: Secondary | ICD-10-CM

## 2019-04-27 LAB — NOVEL CORONAVIRUS, NAA: SARS-CoV-2, NAA: NOT DETECTED

## 2019-04-27 MED FILL — IBUPROFEN 600 MG TABLET: 600 | 30 days supply | Qty: 60 | Fill #1

## 2019-04-27 MED FILL — ?AMLODIPINE BESYLATE 5MG TA: 5 | 30 days supply | Qty: 30 | Fill #1

## 2019-06-19 MED FILL — ?AMLODIPINE BESYLATE 5MG TA: 5 | 30 days supply | Qty: 30 | Fill #2

## 2019-06-20 ENCOUNTER — Ambulatory Visit: Payer: Self-pay | Attending: Family Medicine

## 2019-06-20 ENCOUNTER — Other Ambulatory Visit: Payer: Self-pay

## 2019-06-22 ENCOUNTER — Other Ambulatory Visit: Payer: Self-pay

## 2019-06-22 ENCOUNTER — Ambulatory Visit: Payer: Self-pay | Attending: Family Medicine | Admitting: Physician Assistant

## 2019-06-22 VITALS — BP 146/84 | HR 105 | Temp 98.0°F | Ht 62.0 in | Wt 186.0 lb

## 2019-06-22 DIAGNOSIS — M791 Myalgia, unspecified site: Secondary | ICD-10-CM

## 2019-06-22 DIAGNOSIS — S82832A Other fracture of upper and lower end of left fibula, initial encounter for closed fracture: Secondary | ICD-10-CM

## 2019-06-22 DIAGNOSIS — E785 Hyperlipidemia, unspecified: Secondary | ICD-10-CM

## 2019-06-22 DIAGNOSIS — R519 Headache, unspecified: Secondary | ICD-10-CM

## 2019-06-22 DIAGNOSIS — I10 Essential (primary) hypertension: Secondary | ICD-10-CM

## 2019-06-22 MED ORDER — AMLODIPINE BESYLATE 5 MG PO TABS: 5.0000 mg | ORAL_TABLET | Freq: Every day | ORAL | 1 refills | Status: DC

## 2019-06-22 MED ORDER — METHOCARBAMOL 500 MG PO TABS: 500.0000 mg | ORAL_TABLET | Freq: Three times a day (TID) | ORAL | 1 refills | Status: DC | PRN

## 2019-06-22 MED ORDER — ATORVASTATIN CALCIUM 20 MG PO TABS: 20.0000 mg | ORAL_TABLET | Freq: Every day | ORAL | 6 refills | Status: DC

## 2019-06-22 MED ORDER — IBUPROFEN 600 MG PO TABS: 600.0000 mg | ORAL_TABLET | Freq: Three times a day (TID) | ORAL | 3 refills | Status: DC | PRN

## 2019-06-22 MED FILL — ?ATORVASTATIN 20 MG TABLET: 20 | 30 days supply | Qty: 30 | Fill #0

## 2019-06-22 MED FILL — ?IBUPROFEN 600 MG TABLETS: 600 | 20 days supply | Qty: 60 | Fill #0

## 2019-06-22 MED FILL — METHOCARBAMOL 500 MG TABS: 500 | 10 days supply | Qty: 30 | Fill #0

## 2019-06-22 NOTE — Progress Notes (Signed)
Patient ID: Sharon Johnson, female   DOB: 06-22-1976, 43 y.o.   MRN: [id number]   Sharon Johnson, is a 43 y.o. female  GYF:[id number]  FFM:[id number]  DOB - 12/25/1975  Subjective:  Chief Complaint and HPI: Sharon Johnson is a 43 y.o. female here today after worker's comp denied her having surgery.  She has been to ortho and other places.  She says no one will treat her bc worker's comp would not pay for the surgery she needs.  She is actively trying to get orange card so she can have the surgery she needs.  Fracture of L distal fibula on L and she is still having some swelling and is still using crutches for ambulation.  She is out of her BP and cholesterol meds.   Needs RF on everything.  No labs in > 1 year  [employer] interpreters translating.    ROS:   Constitutional:  No f/c, No night sweats, No unexplained weight loss. EENT:  No vision changes, No blurry vision, No hearing changes. No mouth, throat, or ear problems.  Respiratory: No cough, No SOB Cardiac: No CP, no palpitations GI:  No abd pain, No N/V/D. GU: No Urinary s/sx Musculoskeletal: L ankle pain Neuro: No headache, no dizziness, no motor weakness.  Skin: No rash Endocrine:  No polydipsia. No polyuria.  Psych: Denies SI/HI  No problems updated.  ALLERGIES: No Known Allergies  PAST MEDICAL HISTORY: Past Medical History:  Diagnosis Date  . Hypertension   . Migraine     MEDICATIONS AT HOME: Prior to Admission medications   Medication Sig Start Date End Date Taking? Authorizing Provider  amLODipine (NORVASC) 5 MG tablet Take 1 tablet (5 mg total) by mouth daily. 06/22/19   Sharon Simmonds, PA-C  atorvastatin (LIPITOR) 20 MG tablet Take 1 tablet (20 mg total) by mouth daily. 06/22/19   Sharon Simmonds, PA-C  ibuprofen (ADVIL) 600 MG tablet Take 1 tablet (600 mg total) by mouth every 8 (eight) hours as needed. For pain; take after eating 06/22/19   Sharon Simmonds, PA-C  levonorgestrel  [facility]) 20 MCG/24HR IUD 1 each by Intrauterine route once.    [provider]  methocarbamol (ROBAXIN) 500 MG tablet Take 1 tablet (500 mg total) by mouth every 8 (eight) hours as needed for muscle spasms. 06/22/19   Sharon Simmonds, PA-C  Multiple Vitamins-Minerals (MULTIVITAMIN GUMMIES ADULT) CHEW Chew 1 tablet by mouth daily.    [provider]  polyethylene glycol (MIRALAX / GLYCOLAX) packet Take 17 g by mouth daily. Patient not taking: Reported on 04/08/2018 11/14/17   Rolly Salter, MD     Objective:  EXAM:   Vitals:   06/22/19 1403  BP: (!) 146/84  Pulse: (!) 105  Temp: 98 F (36.7 C)  TempSrc: Oral  SpO2: 97%  Weight: 186 lb (84.4 kg)  Height: 5\' 2"  (1.575 m)    General appearance : A&OX3. NAD. Non-toxic-appearing;  Using crutches HEENT: Atraumatic and Normocephalic.  PERRLA. EOM intact.   Neck: supple, no JVD. No cervical lymphadenopathy. No thyromegaly Chest/Lungs:  Breathing-non-labored, Good air entry bilaterally, breath sounds normal without rales, rhonchi, or wheezing  CVS: S1 S2 regular, no murmurs, gallops, rubs  Extremities: LLE with mild edema and TTP over lateral malleolus with poor ROM and mild swelling.  Pulses=B.  No swelling on R.    Psych:  TP linear. J/I WNL. Normal speech. Appropriate eye contact and affect.  Skin:  No Rash  Data Review Lab  Results  Component Value Date   HGBA1C 5.2 11/09/2017     Assessment & Plan   1. Essential hypertension Not controlled-resume meds We have discussed target BP range and blood pressure goal. I have advised patient to check BP regularly and to call us back or report to clinic if the numbers are consistently higher than 140/90. We discussed the importance of compliance with medical therapy and DASH diet recommended, consequences of uncontrolled hypertension discussed.  - amLODipine (NORVASC) 5 MG tablet; Take 1 tablet (5 mg total) by mouth daily.  Dispense: 90 tablet; Refill: 1 - ibuprofen  (ADVIL) 600 MG tablet; Take 1 tablet (600 mg total) by mouth every 8 (eight) hours as needed. For pain; take after eating  Dispense: 60 tablet; Refill: 3 - Comprehensive metabolic panel  2. Hyperlipidemia, unspecified hyperlipidemia type - atorvastatin (LIPITOR) 20 MG tablet; Take 1 tablet (20 mg total) by mouth daily.  Dispense: 30 tablet; Refill: 6  3. Myalgia - ibuprofen (ADVIL) 600 MG tablet; Take 1 tablet (600 mg total) by mouth every 8 (eight) hours as needed. For pain; take after eating  Dispense: 60 tablet; Refill: 3  4. Nonintractable episodic headache, unspecified headache type - methocarbamol (ROBAXIN) 500 MG tablet; Take 1 tablet (500 mg total) by mouth every 8 (eight) hours as needed for muscle spasms.  Dispense: 30 tablet; Refill: 1  5. Closed fracture of distal end of left fibula, unspecified fracture morphology, initial encounter Has been refused treatment by worker's comp - Ambulatory referral to Orthopedic Surgery     Patient have been counseled extensively about nutrition and exercise  Return in about 3 months (around 09/20/2019) for with PCP for chronic conditions.  The patient was given clear instructions to go to ER or return to medical center if symptoms don't improve, worsen or new problems develop. The patient verbalized understanding. The patient was told to call to get lab results if they haven't heard anything in the next week.     Freeman Caldron, PA-C [facility] and [place], [place]   06/22/2019, 2:55 PM

## 2019-06-23 LAB — COMPREHENSIVE METABOLIC PANEL
ALT: 22 IU/L (ref 0–32)
AST: 17 IU/L (ref 0–40)
Albumin/Globulin Ratio: 1.6 (ref 1.2–2.2)
Albumin: 4.6 g/dL (ref 3.8–4.8)
Alkaline Phosphatase: 77 IU/L (ref 39–117)
BUN/Creatinine Ratio: 18 (ref 9–23)
BUN: 9 mg/dL (ref 6–24)
Bilirubin Total: 0.2 mg/dL (ref 0.0–1.2)
CO2: 22 mmol/L (ref 20–29)
Calcium: 10.1 mg/dL (ref 8.7–10.2)
Chloride: 104 mmol/L (ref 96–106)
Creatinine, Ser: 0.5 mg/dL — ABNORMAL LOW (ref 0.57–1.00)
GFR calc Af Amer: 137 mL/min/{1.73_m2} (ref >59)
GFR calc non Af Amer: 119 mL/min/{1.73_m2} (ref >59)
Globulin, Total: 2.8 g/dL (ref 1.5–4.5)
Glucose: 129 mg/dL — ABNORMAL HIGH (ref 65–99)
Potassium: 4 mmol/L (ref 3.5–5.2)
Sodium: 143 mmol/L (ref 134–144)
Total Protein: 7.4 g/dL (ref 6.0–8.5)

## 2019-06-27 ENCOUNTER — Other Ambulatory Visit: Payer: Self-pay

## 2019-06-27 ENCOUNTER — Encounter: Payer: Self-pay | Admitting: Orthopaedic Surgery

## 2019-06-27 ENCOUNTER — Ambulatory Visit (INDEPENDENT_AMBULATORY_CARE_PROVIDER_SITE_OTHER): Payer: Worker's Compensation | Admitting: Orthopaedic Surgery

## 2019-06-27 ENCOUNTER — Ambulatory Visit (INDEPENDENT_AMBULATORY_CARE_PROVIDER_SITE_OTHER): Payer: Worker's Compensation

## 2019-06-27 DIAGNOSIS — S82832A Other fracture of upper and lower end of left fibula, initial encounter for closed fracture: Secondary | ICD-10-CM

## 2019-06-27 NOTE — Progress Notes (Signed)
Office Visit Note   Patient: Sharon Johnson           Date of Birth: 14-Jan-1976           MRN: [id number] Visit Date: 06/27/2019              Requested by: Argentina Donovan, PA-C [place],  [place] [address] PCP: Antony Blackbird, MD   Assessment & Plan: Visit Diagnoses:  1. Closed fracture of distal end of left fibula, unspecified fracture morphology, initial encounter     Plan: Impression is nearly 2-1/2 months status post left distal fibula fracture.  At this point she is passive time.  Of surgical intervention.  She has nearly healed on radiographs so we will continue to treat this nonoperatively.  We will provide her with an ASO and she can be weightbearing as tolerated.  We will start her in physical therapy.  Internal prescription sent in.  We will go ahead and obtain a CT scan of the left ankle to further evaluate her healing.  Follow-up with [place] on 4 weeks time for repeat evaluation and 3 view x-rays of the left ankle.  Follow-Up Instructions: Return in about 4 weeks (around 07/25/2019).   Orders:  Orders Placed This Encounter  Procedures  . XR Ankle Complete Left  . XR Foot Complete Left  . CT ANKLE LEFT WO CONTRAST  . Ambulatory referral to Physical Therapy   No orders of the defined types were placed in this encounter.     Procedures: No procedures performed   Clinical Data: No additional findings.   Subjective: Chief Complaint  Patient presents with  . Left Ankle - Pain  . Left Foot - Pain    HPI patient is a pleasant 43 year old female who comes in today with concerns about her left ankle.  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  She was initially seen at a hospital where x-rays were obtained.  Distal fibula fracture noted.  She was placed in what sounds like an Ace wrap.  She followed up with [facility] on 04/25/2019 where she was told that she needed surgery.  She never heard  back from them as she was told they are waiting on Workmen's Comp. approval.  She states that she was never approved.  [facility] to [place].  She has continued pain to the left lateral ankle.  She is partially weightbearing and using crutches to help with ambulation.  She notes associated swelling worse at the end of the day.  Review of Systems as detailed in HPI.  All others reviewed and are negative.   Objective: Vital Signs: There were no vitals taken for this visit.  Physical Exam well-developed well-nourished female in no acute distress.  Alert and oriented x3.  Ortho Exam examination of her left ankle reveals mild to moderate swelling.  She has moderate tenderness along the distal fibula.  No medial sided tenderness.  Limited range of motion secondary to pain and stiffness.  Negative anterior drawer and talar tilt.  She is neurovascularly intact distally.  Specialty Comments:  No specialty comments available.  Imaging: Xr Ankle Complete Left  Result Date: 06/27/2019 X-rays demonstrate a nearly healed distal fibula fracture with acceptable alignment.  No widening of the mortise.  Xr Foot Complete Left  Result Date: 06/27/2019 No acute or structural abnormalities  PMFS History: Patient Active Problem List   Diagnosis Date Noted  . Migraine headache 04/08/2018  . Hypertension 04/08/2018  . SIRS (systemic inflammatory response syndrome) (HCC) 11/08/2017  . Calculus of gallbladder without cholecystitis without obstruction 11/05/2017  . Fatty liver 11/05/2017  . Nausea and vomiting 10/28/2017  . Lower abdominal pain 10/28/2017  . Transaminitis 10/28/2017  . Hyperbilirubinemia 10/28/2017  . Leukocytosis 10/28/2017   Past Medical History:  Diagnosis Date  . Hypertension   . Migraine     Family History  Problem Relation Age of Onset  . Hypertension Mother   . Breast cancer Sister     Past Surgical  History:  Procedure Laterality Date  . CHOLECYSTECTOMY N/A 11/10/2017   Procedure: LAPAROSCOPIC CHOLECYSTECTOMY WITH INTRAOPERATIVE CHOLANGIOGRAM;  Surgeon: Luretha Murphy, MD;  Location: [facility];  Service: General;  Laterality: N/A;   Social History   Occupational History  . Not on file  Tobacco Use  . Smoking status: Never Smoker  . Smokeless tobacco: Never Used  Substance and Sexual Activity  . Alcohol use: No    Alcohol/week: 0.0 standard drinks  . Drug use: No  . Sexual activity: Not Currently    Birth control/protection: I.U.D.

## 2019-06-28 ENCOUNTER — Ambulatory Visit: Payer: Self-pay | Attending: Physician Assistant | Admitting: Physical Therapy

## 2019-06-28 ENCOUNTER — Encounter: Payer: Self-pay | Admitting: Physical Therapy

## 2019-06-28 ENCOUNTER — Other Ambulatory Visit: Payer: Self-pay

## 2019-06-28 DIAGNOSIS — M25672 Stiffness of left ankle, not elsewhere classified: Secondary | ICD-10-CM | POA: Insufficient documentation

## 2019-06-28 DIAGNOSIS — R262 Difficulty in walking, not elsewhere classified: Secondary | ICD-10-CM | POA: Insufficient documentation

## 2019-06-28 DIAGNOSIS — R600 Localized edema: Secondary | ICD-10-CM | POA: Insufficient documentation

## 2019-06-28 DIAGNOSIS — M25572 Pain in left ankle and joints of left foot: Secondary | ICD-10-CM | POA: Insufficient documentation

## 2019-06-28 NOTE — Therapy (Signed)
[place], [place], [address] Phone: [phone]   Fax:  [phone]  Physical Therapy Evaluation  Patient Details  Name: Sharon Johnson MRN: [id number] Date of Birth: 1975-08-26 Referring Provider (PT): Dwana Melena, [place]   Encounter Date: 06/28/2019  PT End of Session - 06/28/19 1138    Visit Number  1    Number of Visits  12    Date for PT Re-Evaluation  08/09/19    Authorization Type  self-pay    PT Start Time  1102    PT Stop Time  1145    PT Time Calculation (min)  43 min    Activity Tolerance  Patient tolerated treatment well    Behavior During Therapy  [facility] for tasks assessed/performed       Past Medical History:  Diagnosis Date  . Hypertension   . Migraine     Past Surgical History:  Procedure Laterality Date  . CHOLECYSTECTOMY N/A 11/10/2017   Procedure: LAPAROSCOPIC CHOLECYSTECTOMY WITH INTRAOPERATIVE CHOLANGIOGRAM;  Surgeon: Johnathan Hausen, MD;  Location: [facility];  Service: General;  Laterality: N/A;    There were no vitals filed for this visit.   Subjective Assessment - 06/28/19 1104    Subjective  Pt. is a 43 y/o female referred to PT s/p left distal fibular fracture which occured secondary to a fall 04/18/19. 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 She went to ED with X-rays taken revealing left distal fibular fracture. This has been managed non-operatively. Pt. currently in ASO brace as of MD visit yesterday and is WBAT. No prior brace use before ASO issued yesterday. X-rays per MD notes revealed fracture healing. CT scan has been ordered as well but not completed as of time of evaluation. Pt. has been unable to work since injury.    Patient is accompained by:  Interpreter   Rebeca [phone]   Pertinent History  HTN    Limitations  Standing;Walking     How long can you walk comfortably?  unable comfortably    Diagnostic tests  X-rays performed, CT ordered but not completed as of time of eval    Patient Stated Goals  Get ankle better    Currently in Pain?  Yes    Pain Score  7     Pain Location  Ankle    Pain Orientation  Left    Pain Descriptors / Indicators  Sharp    Pain Type  Acute pain    Pain Onset  More than a month ago    Pain Frequency  Constant    Aggravating Factors   walking and standing    Pain Relieving Factors  rest with leg elevated, soaks in salt water, massage    Effect of Pain on Daily Activities  limits walking and standing tolerance         [facility] PT Assessment - 06/28/19 0001      Assessment   Medical Diagnosis  s/p left distal fibular fracture    Referring Provider (PT)  Dwana Melena, PA-C    Onset Date/Surgical Date  04/18/19    Next MD Visit  07/28/2019    Prior Therapy  none      Precautions   Precaution Comments  WBAT left LE in ASO brace    Required Braces or Orthoses  --   left ASO brace  Restrictions   Weight Bearing Restrictions  No      Balance Screen   Has the patient fallen in the past 6 months  Yes    How many times?  1      Home Environment   Living Environment  Private residence    Living Arrangements  Children    Home Access  Stairs to enter    Entrance Stairs-Number of Steps  3    Entrance Stairs-Rails  Left    Home Layout  One level    Home Equipment  Crutches      Prior Function   Level of Independence  Independent with community mobility without device      Cognition   Overall Cognitive Status  Within Functional Limits for tasks assessed      Observation/Other Assessments   Focus on Therapeutic Outcomes (FOTO)   74% limited      Observation/Other Assessments-Edema    Edema  Circumferential      Circumferential Edema   Circumferential - Right  23.75 cm    Circumferential - Left   24 cm      Sensation   Light Touch  Appears Intact    Additional Comments   left dorsalis pulse intact      ROM / Strength   AROM / PROM / Strength  AROM;PROM;Strength      AROM   AROM Assessment Site  Ankle    Right/Left Ankle  Right;Left    Right Ankle Dorsiflexion  10    Right Ankle Plantar Flexion  50    Right Ankle Inversion  20    Right Ankle Eversion  10    Left Ankle Dorsiflexion  -6    Left Ankle Plantar Flexion  40    Left Ankle Inversion  12    Left Ankle Eversion  5      PROM   PROM Assessment Site  Ankle    Right/Left Ankle  Right    Right Ankle Dorsiflexion  -3    Right Ankle Plantar Flexion  42    Right Ankle Inversion  15    Right Ankle Eversion  10      Strength   Overall Strength Comments  Right ankle grossly 5/5, left ankle MMTs not tested today given s/p fracture    Strength Assessment Site  Ankle      Flexibility   Soft Tissue Assessment /Muscle Length  --   left gastroc tightness     Ambulation/Gait   Gait Comments  Pt. ambulates mod I with left foot in ASO brace with antalgic gait, decreased left LE stance time and decreased right steplength.                Objective measurements completed on examination: See above findings.      [facility] Adult PT Treatment/Exercise - 06/28/19 0001      Exercises   Exercises  Ankle      Ankle Exercises: Seated   Other Seated Ankle Exercises  Brief HEP instruction and handout review-see chart copy of exercises             PT Education - 06/28/19 1155    Education Details  HEP, POC    Person(s) Educated  Patient    Methods  Explanation;Demonstration;Tactile cues;Verbal cues;Handout    Comprehension  Verbal cues required;Returned demonstration;Verbalized understanding;Tactile cues required          PT Long Term Goals - 06/28/19 1201      PT  LONG TERM GOAL #1   Title  Independent with HEP    Baseline  needs HEP    Time  6    Period  Weeks    Status  New    Target Date  08/09/19      PT LONG TERM GOAL #2   Title  Increase left ankle dorsiflexion AROM to  at least past neutral to improve toe clearance with gait    Baseline  -6 deg    Time  6    Period  Weeks    Status  New    Target Date  08/09/19      PT LONG TERM GOAL #3   Title  Improve FOTO outcome measure score to 41% or less impairment    Baseline  74% limited    Time  6    Period  Weeks    Status  New    Target Date  08/09/19      PT LONG TERM GOAL #4   Title  Left ankle strength grossly 5/5 to improve ankle stability for outdoor ambulation over uneven surfaces    Baseline  MMTs not formally tested at eval    Time  6    Period  Weeks    Status  New    Target Date  08/09/19      PT LONG TERM GOAL #5   Title  Tolerate standing/ambulation periods at least 30-40 minutes with left ankle pain 3/10 or less to improve tolerance for chores and ability for work duties, community mobility    Baseline  7/10    Time  6    Period  Weeks    Status  New    Target Date  08/09/19             Plan - 06/28/19 1151    Clinical Impression Statement  Pt. presents 2 1/2 months s/p left distal fibular fracture managed non-operatively with left ankle/lower leg pain, decreased ankle ROM/stiffness and muscle weakness with associated gait and mobility impairments. Pt. would benefit from PT to address current associated functional limitations and assist return to work.    Personal Factors and Comorbidities  Other   pt. unable to read but reports daughters can assist with HEP handout instructions with pictures if needed   Examination-Activity Limitations  Stand;Stairs;Locomotion Level;Lift;Squat;Bend;Carry    Examination-Participation Restrictions  Shop;Cleaning;Community Activity    Stability/Clinical Decision Making  Stable/Uncomplicated    Clinical Decision Making  Low    Rehab Potential  Good    PT Frequency  2x / week    PT Duration  6 weeks    PT Treatment/Interventions  Taping;ADLs/Self Care Home Management;Cryotherapy;Electrical Stimulation;Moist Heat;Gait training;Stair  training;Balance training;Therapeutic exercise;Therapeutic activities;Neuromuscular re-education;Manual techniques;Passive range of motion;Dry needling    PT Next Visit Plan  Review HEP as needed, work on gait mechanics in ASO, try gentle standing weightshifts and marches, seated ankle ROM with BAPS, ankle PROM, gastroc stretches, Theraband 4-way, standing/closed chain activities can be progressed as tolerated pending pain    PT Home Exercise Plan  Medbridge: [id number], longsitting gastroc stretch, seated towel EV/IV, Theraband ankle 4-way-issued red Theraband    Consulted and Agree with Plan of Care  Patient       Patient will benefit from skilled therapeutic intervention in order to improve the following deficits and impairments:  Pain, Impaired flexibility, Decreased strength, Decreased activity tolerance, Decreased range of motion, Increased edema, Difficulty walking, Decreased balance  Visit Diagnosis: Pain in left ankle and  joints of left foot - Plan: PT plan of care cert/re-cert  Stiffness of left ankle, not elsewhere classified - Plan: PT plan of care cert/re-cert  Difficulty in walking, not elsewhere classified - Plan: PT plan of care cert/re-cert  Localized edema - Plan: PT plan of care cert/re-cert     Problem List Patient Active Problem List   Diagnosis Date Noted  . Migraine headache 04/08/2018  . Hypertension 04/08/2018  . SIRS (systemic inflammatory response syndrome) ([place]) 11/08/2017  . Calculus of gallbladder without cholecystitis without obstruction 11/05/2017  . Fatty liver 11/05/2017  . Nausea and vomiting 10/28/2017  . Lower abdominal pain 10/28/2017  . Transaminitis 10/28/2017  . Hyperbilirubinemia 10/28/2017  . Leukocytosis 10/28/2017    Beaulah Dinning, PT, DPT 06/28/19 12:09 PM  [facility] [address] [place], [place], [address] Phone: [phone]   Fax:  [phone]  Name: ALEXIANNA NACHREINER MRN: [id number] Date of Birth: 11-07-1975

## 2019-06-28 NOTE — Therapy (Signed)
[place], [place], [address] Phone: [phone]   Fax:  [phone]  Physical Therapy Evaluation  Patient Details  Name: Sharon Johnson MRN: [id number] Date of Birth: 1975-08-26 Referring Provider (PT): Dwana Melena, [place]   Encounter Date: 06/28/2019  PT End of Session - 06/28/19 1138    Visit Number  1    Number of Visits  12    Date for PT Re-Evaluation  08/09/19    Authorization Type  self-pay    PT Start Time  1102    PT Stop Time  1145    PT Time Calculation (min)  43 min    Activity Tolerance  Patient tolerated treatment well    Behavior During Therapy  [facility] for tasks assessed/performed       Past Medical History:  Diagnosis Date  . Hypertension   . Migraine     Past Surgical History:  Procedure Laterality Date  . CHOLECYSTECTOMY N/A 11/10/2017   Procedure: LAPAROSCOPIC CHOLECYSTECTOMY WITH INTRAOPERATIVE CHOLANGIOGRAM;  Surgeon: Johnathan Hausen, MD;  Location: [facility];  Service: General;  Laterality: N/A;    There were no vitals filed for this visit.   Subjective Assessment - 06/28/19 1104    Subjective  Pt. is a 43 y/o female referred to PT s/p left distal fibular fracture which occured secondary to a fall 04/18/19. 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 She went to ED with X-rays taken revealing left distal fibular fracture. This has been managed non-operatively. Pt. currently in ASO brace as of MD visit yesterday and is WBAT. No prior brace use before ASO issued yesterday. X-rays per MD notes revealed fracture healing. CT scan has been ordered as well but not completed as of time of evaluation. Pt. has been unable to work since injury.    Patient is accompained by:  Interpreter   Rebeca [phone]   Pertinent History  HTN    Limitations  Standing;Walking     How long can you walk comfortably?  unable comfortably    Diagnostic tests  X-rays performed, CT ordered but not completed as of time of eval    Patient Stated Goals  Get ankle better    Currently in Pain?  Yes    Pain Score  7     Pain Location  Ankle    Pain Orientation  Left    Pain Descriptors / Indicators  Sharp    Pain Type  Acute pain    Pain Onset  More than a month ago    Pain Frequency  Constant    Aggravating Factors   walking and standing    Pain Relieving Factors  rest with leg elevated, soaks in salt water, massage    Effect of Pain on Daily Activities  limits walking and standing tolerance         [facility] PT Assessment - 06/28/19 0001      Assessment   Medical Diagnosis  s/p left distal fibular fracture    Referring Provider (PT)  Dwana Melena, PA-C    Onset Date/Surgical Date  04/18/19    Next MD Visit  07/28/2019    Prior Therapy  none      Precautions   Precaution Comments  WBAT left LE in ASO brace    Required Braces or Orthoses  --   left ASO brace  Restrictions   Weight Bearing Restrictions  No      Balance Screen   Has the patient fallen in the past 6 months  Yes    How many times?  1      Home Environment   Living Environment  Private residence    Living Arrangements  Children    Home Access  Stairs to enter    Entrance Stairs-Number of Steps  3    Entrance Stairs-Rails  Left    Home Layout  One level    Home Equipment  Crutches      Prior Function   Level of Independence  Independent with community mobility without device      Cognition   Overall Cognitive Status  Within Functional Limits for tasks assessed      Observation/Other Assessments   Focus on Therapeutic Outcomes (FOTO)   74% limited      Observation/Other Assessments-Edema    Edema  Circumferential      Circumferential Edema   Circumferential - Right  23.75 cm    Circumferential - Left   24 cm      Sensation   Light Touch  Appears Intact    Additional Comments   left dorsalis pulse intact      ROM / Strength   AROM / PROM / Strength  AROM;PROM;Strength      AROM   AROM Assessment Site  Ankle    Right/Left Ankle  Right;Left    Right Ankle Dorsiflexion  10    Right Ankle Plantar Flexion  50    Right Ankle Inversion  20    Right Ankle Eversion  10    Left Ankle Dorsiflexion  -6    Left Ankle Plantar Flexion  40    Left Ankle Inversion  12    Left Ankle Eversion  5      PROM   PROM Assessment Site  Ankle    Right/Left Ankle  Right    Right Ankle Dorsiflexion  -3    Right Ankle Plantar Flexion  42    Right Ankle Inversion  15    Right Ankle Eversion  10      Strength   Overall Strength Comments  Right ankle grossly 5/5, left ankle MMTs not tested today given s/p fracture    Strength Assessment Site  Ankle      Flexibility   Soft Tissue Assessment /Muscle Length  --   left gastroc tightness     Ambulation/Gait   Gait Comments  Pt. ambulates mod I with left foot in ASO brace with antalgic gait, decreased left LE stance time and decreased right steplength.                Objective measurements completed on examination: See above findings.      [facility] Adult PT Treatment/Exercise - 06/28/19 0001      Exercises   Exercises  Ankle      Ankle Exercises: Seated   Other Seated Ankle Exercises  Brief HEP instruction and handout review-see chart copy of exercises             PT Education - 06/28/19 1155    Education Details  HEP, POC    Person(s) Educated  Patient    Methods  Explanation;Demonstration;Tactile cues;Verbal cues;Handout    Comprehension  Verbal cues required;Returned demonstration;Verbalized understanding;Tactile cues required                  Plan - 06/28/19 1151  Clinical Impression Statement  Pt. presents 2 1/2 months s/p left distal fibular fracture managed non-operatively with left ankle/lower leg pain, decreased ankle ROM/stiffness and muscle weakness with associated gait and mobility  impairments. Pt. would benefit from PT to address current associated functional limitations and assist return to work.    Personal Factors and Comorbidities  Other   pt. unable to read but reports daughters can assist with HEP handout instructions with pictures if needed   Examination-Activity Limitations  Stand;Stairs;Locomotion Level;Lift;Squat;Bend;Carry    Examination-Participation Restrictions  Shop;Cleaning;Community Activity    Stability/Clinical Decision Making  Stable/Uncomplicated    Clinical Decision Making  Low    Rehab Potential  Good    PT Frequency  2x / week    PT Duration  6 weeks    PT Treatment/Interventions  Taping;ADLs/Self Care Home Management;Cryotherapy;Electrical Stimulation;Moist Heat;Gait training;Stair training;Balance training;Therapeutic exercise;Therapeutic activities;Neuromuscular re-education;Manual techniques;Passive range of motion;Dry needling    PT Next Visit Plan  Review HEP as needed, work on gait mechanics in ASO, try gentle standing weightshifts and marches, seated ankle ROM with BAPS, ankle PROM, gastroc stretches, Theraband 4-way, standing/closed chain activities can be progressed as tolerated pending pain    PT Home Exercise Plan  Medbridge: [id number], longsitting gastroc stretch, seated towel EV/IV, Theraband ankle 4-way-issued red Theraband    Consulted and Agree with Plan of Care  Patient       Patient will benefit from skilled therapeutic intervention in order to improve the following deficits and impairments:  Pain, Impaired flexibility, Decreased strength, Decreased activity tolerance, Decreased range of motion, Increased edema, Difficulty walking, Decreased balance  Visit Diagnosis: Pain in left ankle and joints of left foot  Stiffness of left ankle, not elsewhere classified  Difficulty in walking, not elsewhere classified  Localized edema     Problem List Patient Active Problem List   Diagnosis Date Noted  . Migraine headache  04/08/2018  . Hypertension 04/08/2018  . SIRS (systemic inflammatory response syndrome) (HCC) 11/08/2017  . Calculus of gallbladder without cholecystitis without obstruction 11/05/2017  . Fatty liver 11/05/2017  . Nausea and vomiting 10/28/2017  . Lower abdominal pain 10/28/2017  . Transaminitis 10/28/2017  . Hyperbilirubinemia 10/28/2017  . Leukocytosis 10/28/2017       Lazarus Gowdahristopher , PT, DPT 06/28/19 12:06 PM  [facility] [address] [place], [place], [address] Phone: [phone]   Fax:  [phone]  Name: Michaelle BirksHilda D Johnson MRN: [id number] Date of Birth: 11/28/1975

## 2019-06-28 NOTE — Addendum Note (Signed)
Addended by: Beaulah Dinning S on: 06/28/2019 12:10 PM   Modules accepted: Orders

## 2019-07-03 ENCOUNTER — Telehealth: Payer: Self-pay | Admitting: Family Medicine

## 2019-07-03 NOTE — Telephone Encounter (Signed)
Patient returned call regarding lab results. Please f/u  

## 2019-07-05 ENCOUNTER — Ambulatory Visit: Payer: Self-pay | Admitting: Physical Therapy

## 2019-07-05 ENCOUNTER — Other Ambulatory Visit: Payer: Self-pay

## 2019-07-05 ENCOUNTER — Encounter: Payer: Self-pay | Admitting: Physical Therapy

## 2019-07-05 DIAGNOSIS — R600 Localized edema: Secondary | ICD-10-CM

## 2019-07-05 DIAGNOSIS — M25672 Stiffness of left ankle, not elsewhere classified: Secondary | ICD-10-CM

## 2019-07-05 DIAGNOSIS — R262 Difficulty in walking, not elsewhere classified: Secondary | ICD-10-CM

## 2019-07-05 DIAGNOSIS — M25572 Pain in left ankle and joints of left foot: Secondary | ICD-10-CM

## 2019-07-05 NOTE — Therapy (Signed)
[facility] [address] [place], [place], [address] Phone: [phone]   Fax:  [phone]  Physical Therapy Treatment  Patient Details  Name: Sharon Johnson MRN: [id number] Date of Birth: 1976-05-10 Referring Provider (PT): Jari Sportsman, [place]   Encounter Date: 07/05/2019  PT End of Session - 07/05/19 0948    Visit Number  2    Number of Visits  12    Date for PT Re-Evaluation  08/09/19    Authorization Type  self-pay    PT Start Time  0932    PT Stop Time  1023    PT Time Calculation (min)  51 min    Activity Tolerance  Patient tolerated treatment well    Behavior During Therapy  [facility] for tasks assessed/performed       Past Medical History:  Diagnosis Date  . Hypertension   . Migraine     Past Surgical History:  Procedure Laterality Date  . CHOLECYSTECTOMY N/A 11/10/2017   Procedure: LAPAROSCOPIC CHOLECYSTECTOMY WITH INTRAOPERATIVE CHOLANGIOGRAM;  Surgeon: Luretha Murphy, MD;  Location: [facility];  Service: General;  Laterality: N/A;    There were no vitals filed for this visit.  Subjective Assessment - 07/05/19 0935    Subjective  Ankle still sore this AM. Pt. continues with ASO brace use. HEP from eval going OK.    Patient is accompained by:  Interpreter   Magda Paganini [phone]   Currently in Pain?  Yes    Pain Score  6     Pain Location  Ankle    Pain Orientation  Left;Lateral    Pain Descriptors / Indicators  Sharp    Pain Type  Acute pain    Pain Onset  More than a month ago    Pain Frequency  Constant    Aggravating Factors   standing and walking    Pain Relieving Factors  rest, elevation of legs    Effect of Pain on Daily Activities  limits standing tolerance and ability for prolonged walking.                       [facility] Adult PT Treatment/Exercise - 07/05/19 0001      Exercises   Exercises  Knee/Hip      Knee/Hip Exercises: Standing   Hip Flexion  AROM;Stengthening;Left;2  sets;10 reps;Knee straight    Hip Flexion Limitations  Red Theraband    Hip Abduction  AROM;Stengthening;Left;2 sets;10 reps;Knee straight    Abduction Limitations  red Theraband    Hip Extension  AROM;Stengthening;Left;2 sets;10 reps;Knee straight    Extension Limitations  red Theraband      Knee/Hip Exercises: Seated   Long Arc Quad  AROM;Strengthening;Left;2 sets;10 reps    Long Arc Quad Weight  4 lbs.    Hamstring Curl  AROM;Strengthening;Left;2 sets;10 reps    Hamstring Limitations  red Theraband      Knee/Hip Exercises: Supine   Quad Sets  AROM;Strengthening;Left;20 reps    Quad Sets Limitations  with towel roll under knee for cueing    Short Arc Quad Sets  AROM;Left;2 sets;10 reps    Short Arc [employer] Limitations  2 lbs.    Straight Leg Raises  PROM;Strengthening;Left;2 sets;10 reps      Modalities   Modalities  Cryotherapy      Cryotherapy   Number Minutes Cryotherapy  10 Minutes    Cryotherapy Location  Ankle    Type of Cryotherapy  Ice pack  Ankle Exercises: Standing   Other Standing Ankle Exercises  weightshifts on Airex a 15 reps, Romberg eyes closed 20 sec x 3 on Airex      Ankle Exercises: Seated   BAPS  Sitting;Level 1;15 reps    Other Seated Ankle Exercises  seated left ankle PF/DF and IV/EV with foot on wooden tilt board x 20 reps      Ankle Exercises: Stretches   Soleus Stretch  3 reps;30 seconds   supine manual stretch   Gastroc Stretch  3 reps;30 seconds   supine manual stretch     Ankle Exercises: Supine   T-Band  Theraband ankle 4-way 2x10 ea. with red Theraband             PT Education - 07/05/19 0948    Education Details  exercises    Person(s) Educated  Patient    Methods  Explanation;Demonstration;Verbal cues    Comprehension  Verbalized understanding;Returned demonstration          PT Long Term Goals - 06/28/19 1201      PT LONG TERM GOAL #1   Title  Independent with HEP    Baseline  needs HEP    Time  6    Period   Weeks    Status  New    Target Date  08/09/19      PT LONG TERM GOAL #2   Title  Increase left ankle dorsiflexion AROM to at least past neutral to improve toe clearance with gait    Baseline  -6 deg    Time  6    Period  Weeks    Status  New    Target Date  08/09/19      PT LONG TERM GOAL #3   Title  Improve FOTO outcome measure score to 41% or less impairment    Baseline  74% limited    Time  6    Period  Weeks    Status  New    Target Date  08/09/19      PT LONG TERM GOAL #4   Title  Left ankle strength grossly 5/5 to improve ankle stability for outdoor ambulation over uneven surfaces    Baseline  MMTs not formally tested at eval    Time  6    Period  Weeks    Status  New    Target Date  08/09/19      PT LONG TERM GOAL #5   Title  Tolerate standing/ambulation periods at least 30-40 minutes with left ankle pain 3/10 or less to improve tolerance for chores and ability for work duties, community mobility    Baseline  7/10    Time  6    Period  Weeks    Status  New    Target Date  08/09/19            Plan - 07/05/19 0949    Clinical Impression Statement  Progressed standing weightshifts to left side and included brief proprioceptove training but focus overall on open chain strengthening given high pain level impacting tolerance for left LE weightbearing. Pt. able to return demos with correct form with min cues. Expect progress will be gradual/ongoing given etiology injury.    Examination-Activity Limitations  Stand;Stairs;Locomotion Level;Lift;Squat;Bend;Carry    Examination-Participation Restrictions  Shop;Cleaning;Community Activity    Stability/Clinical Decision Making  Stable/Uncomplicated    Clinical Decision Making  Low    Rehab Potential  Good    PT Frequency  2x / week  PT Duration  6 weeks    PT Treatment/Interventions  Taping;ADLs/Self Care Home Management;Cryotherapy;Electrical Stimulation;Moist Heat;Gait training;Stair training;Balance  training;Therapeutic exercise;Therapeutic activities;Neuromuscular re-education;Manual techniques;Passive range of motion;Dry needling    PT Next Visit Plan  Continue ankle ROM, open chain strengthening for ankle, knee, hip, continue/progress standing and CKC activities as tolerated pending pain.    PT Home Exercise Plan  Medbridge: [email or web address], longsitting gastroc stretch, seated towel EV/IV, Theraband ankle 4-way-issued red Theraband    Consulted and Agree with Plan of Care  Patient       Patient will benefit from skilled therapeutic intervention in order to improve the following deficits and impairments:  Pain, Impaired flexibility, Decreased strength, Decreased activity tolerance, Decreased range of motion, Increased edema, Difficulty walking, Decreased balance  Visit Diagnosis: Pain in left ankle and joints of left foot  Stiffness of left ankle, not elsewhere classified  Difficulty in walking, not elsewhere classified  Localized edema     Problem List Patient Active Problem List   Diagnosis Date Noted  . Migraine headache 04/08/2018  . Hypertension 04/08/2018  . SIRS (systemic inflammatory response syndrome) ([place]) 11/08/2017  . Calculus of gallbladder without cholecystitis without obstruction 11/05/2017  . Fatty liver 11/05/2017  . Nausea and vomiting 10/28/2017  . Lower abdominal pain 10/28/2017  . Transaminitis 10/28/2017  . Hyperbilirubinemia 10/28/2017  . Leukocytosis 10/28/2017    Beaulah Dinning, PT, DPT 07/05/19 10:18 AM  [address] [facility] [address] [place], [place], [address] Phone: [phone]   Fax:  [phone]  Name: Sharon Johnson MRN: [id number] Date of Birth: June 11, 1976

## 2019-07-06 ENCOUNTER — Telehealth (INDEPENDENT_AMBULATORY_CARE_PROVIDER_SITE_OTHER): Payer: Self-pay

## 2019-07-06 NOTE — Telephone Encounter (Signed)
Call placed using pacific interpreter [phone]) patient verified date of birth. She is aware that her blood sugar is high. Advised her to eliminate sugars and white carbohydrates from her diet to avoid developing diabetes. Kidney, liver and electrolytes are normal. Advised patient to drink more water. She expressed understanding of results after having questions answered about sugar. Nat Christen, CMA

## 2019-07-06 NOTE — Telephone Encounter (Signed)
-----   Message from Argentina Donovan, [place] sent at 06/28/2019  8:21 AM EST ----- Your blood sugar is high.  Eliminate sugar and white carbohydrates from your diet to avoid developing diabetes.  Your kidney, liver, and electrolytes are within normal limits.  Drink more water.  Follow-up as planned.  Thanks, Freeman Caldron, PA-c

## 2019-07-07 ENCOUNTER — Encounter: Payer: Self-pay | Admitting: Physical Therapy

## 2019-07-07 ENCOUNTER — Ambulatory Visit: Payer: Self-pay | Admitting: Physical Therapy

## 2019-07-07 ENCOUNTER — Other Ambulatory Visit: Payer: Self-pay

## 2019-07-07 DIAGNOSIS — R262 Difficulty in walking, not elsewhere classified: Secondary | ICD-10-CM

## 2019-07-07 DIAGNOSIS — M25672 Stiffness of left ankle, not elsewhere classified: Secondary | ICD-10-CM

## 2019-07-07 DIAGNOSIS — R600 Localized edema: Secondary | ICD-10-CM

## 2019-07-07 DIAGNOSIS — M25572 Pain in left ankle and joints of left foot: Secondary | ICD-10-CM

## 2019-07-07 NOTE — Therapy (Signed)
[place], [place], [address] Phone: [phone]   Fax:  [phone]  Physical Therapy Treatment  Patient Details  Name: Sharon Johnson MRN: [id number] Date of Birth: Nov 03, 1975 Referring Provider (PT): Dwana Melena, [place]   Encounter Date: 07/07/2019  PT End of Session - 07/07/19 0934    Visit Number  3    Number of Visits  12    Date for PT Re-Evaluation  08/09/19    Authorization Type  self-pay    PT Start Time  0930    PT Stop Time  1015    PT Time Calculation (min)  45 min       Past Medical History:  Diagnosis Date  . Hypertension   . Migraine     Past Surgical History:  Procedure Laterality Date  . CHOLECYSTECTOMY N/A 11/10/2017   Procedure: LAPAROSCOPIC CHOLECYSTECTOMY WITH INTRAOPERATIVE CHOLANGIOGRAM;  Surgeon: Johnathan Hausen, MD;  Location: [facility];  Service: General;  Laterality: N/A;    There were no vitals filed for this visit.  Subjective Assessment - 07/07/19 0932    Subjective  Ankle is still hurting and it swells up when I walk around the house. I am using ice and elevation 2 times per day.    Patient is accompained by:  Interpreter   Caren Griffins [phone]   Currently in Pain?  Yes    Pain Score  6     Pain Location  Ankle    Pain Orientation  Left;Lateral    Pain Descriptors / Indicators  Throbbing    Pain Type  Acute pain    Aggravating Factors   activity on feet    Pain Relieving Factors  rest, elevation                       [facility] Adult PT Treatment/Exercise - 07/07/19 0001      Ankle Exercises: Seated   Towel Crunch  5 reps   c/o pain 3-4th metatarsals to lateral    Towel Inversion/Eversion  5 reps    Heel Raises  20 reps    Toe Raise  20 reps    BAPS  Sitting;Level 1;15 reps    Other Seated Ankle Exercises  seated left ankle PF/DF and IV/EV with foot on wooden tilt board x 20 reps      Ankle Exercises: Supine   T-Band  Theraband ankle  4-way 2x10 ea. with red Theraband      Ankle Exercises: Standing   Other Standing Ankle Exercises  forward stepping for weight shifting and heel strike /toe off , followed by gait 50 ft with cues for heel strike and equal steps                   PT Long Term Goals - 06/28/19 1201      PT LONG TERM GOAL #1   Title  Independent with HEP    Baseline  needs HEP    Time  6    Period  Weeks    Status  New    Target Date  08/09/19      PT LONG TERM GOAL #2   Title  Increase left ankle dorsiflexion AROM to at least past neutral to improve toe clearance with gait    Baseline  -6 deg    Time  6    Period  Weeks    Status  New    Target Date  08/09/19  PT LONG TERM GOAL #3   Title  Improve FOTO outcome measure score to 41% or less impairment    Baseline  74% limited    Time  6    Period  Weeks    Status  New    Target Date  08/09/19      PT LONG TERM GOAL #4   Title  Left ankle strength grossly 5/5 to improve ankle stability for outdoor ambulation over uneven surfaces    Baseline  MMTs not formally tested at eval    Time  6    Period  Weeks    Status  New    Target Date  08/09/19      PT LONG TERM GOAL #5   Title  Tolerate standing/ambulation periods at least 30-40 minutes with left ankle pain 3/10 or less to improve tolerance for chores and ability for work duties, community mobility    Baseline  7/10    Time  6    Period  Weeks    Status  New    Target Date  08/09/19            Plan - 07/07/19 0957    Clinical Impression Statement  pt arrives reporting increased swelling with ambulation. Reviewed ice and elevation and she reports compliance. Continued with foot and ankle mobility and strengthening. Began gait taining and forward and retro weight shift with max cues required for technique. Able to ambulate with decreased limp after cues.    PT Next Visit Plan  Continue ankle ROM, open chain strengthening for ankle, knee, hip, continue/progress standing  and CKC activities as tolerated pending pain.    PT Home Exercise Plan  Medbridge: [id number], longsitting gastroc stretch, seated towel EV/IV, Theraband ankle 4-way-issued red Theraband       Patient will benefit from skilled therapeutic intervention in order to improve the following deficits and impairments:  Pain, Impaired flexibility, Decreased strength, Decreased activity tolerance, Decreased range of motion, Increased edema, Difficulty walking, Decreased balance  Visit Diagnosis: Pain in left ankle and joints of left foot  Stiffness of left ankle, not elsewhere classified  Difficulty in walking, not elsewhere classified  Localized edema     Problem List Patient Active Problem List   Diagnosis Date Noted  . Migraine headache 04/08/2018  . Hypertension 04/08/2018  . SIRS (systemic inflammatory response syndrome) ([facility]) 11/08/2017  . Calculus of gallbladder without cholecystitis without obstruction 11/05/2017  . Fatty liver 11/05/2017  . Nausea and vomiting 10/28/2017  . Lower abdominal pain 10/28/2017  . Transaminitis 10/28/2017  . Hyperbilirubinemia 10/28/2017  . Leukocytosis 10/28/2017    Sherrie Mustache, PTA 07/07/2019, 10:10 AM  [facility] [address] [place], [place], [address] Phone: [phone]   Fax:  [phone]  Name: Sharon Johnson MRN: [id number] Date of Birth: [birth year]

## 2019-07-10 ENCOUNTER — Ambulatory Visit
Admission: RE | Admit: 2019-07-10 | Discharge: 2019-07-10 | Disposition: A | Discharge status: Home / Self Care | Payer: No Typology Code available for payment source | Source: Ambulatory Visit | Attending: Orthopaedic Surgery | Admitting: Orthopaedic Surgery

## 2019-07-10 DIAGNOSIS — S82832A Other fracture of upper and lower end of left fibula, initial encounter for closed fracture: Secondary | ICD-10-CM

## 2019-07-18 ENCOUNTER — Encounter: Payer: Self-pay | Admitting: Physical Therapy

## 2019-07-18 ENCOUNTER — Ambulatory Visit: Payer: Self-pay | Admitting: Physical Therapy

## 2019-07-18 ENCOUNTER — Other Ambulatory Visit: Payer: Self-pay

## 2019-07-18 DIAGNOSIS — M25572 Pain in left ankle and joints of left foot: Secondary | ICD-10-CM

## 2019-07-18 DIAGNOSIS — R262 Difficulty in walking, not elsewhere classified: Secondary | ICD-10-CM

## 2019-07-18 DIAGNOSIS — M25672 Stiffness of left ankle, not elsewhere classified: Secondary | ICD-10-CM

## 2019-07-18 DIAGNOSIS — R600 Localized edema: Secondary | ICD-10-CM

## 2019-07-18 NOTE — Therapy (Signed)
[facility] [place] [address] [place], [place], [address] Phone: [phone]   Fax:  [phone]  Physical Therapy Treatment  Patient Details  Name: Sharon Johnson MRN: [id number] Date of Birth: [birth year] Referring Provider (PT): Jari Sportsman, [place]   Encounter Date: 07/18/2019  PT End of Session - 07/18/19 1142    Visit Number  4    Number of Visits  12    Date for PT Re-Evaluation  08/09/19    Authorization Type  self-pay    PT Start Time  1102    PT Stop Time  1150    PT Time Calculation (min)  48 min       Past Medical History:  Diagnosis Date  . Hypertension   . Migraine     Past Surgical History:  Procedure Laterality Date  . CHOLECYSTECTOMY N/A 11/10/2017   Procedure: LAPAROSCOPIC CHOLECYSTECTOMY WITH INTRAOPERATIVE CHOLANGIOGRAM;  Surgeon: Luretha Murphy, MD;  Location: [facility];  Service: General;  Laterality: N/A;    There were no vitals filed for this visit.  Subjective Assessment - 07/18/19 1112    Subjective  Ankle does not hurt as bad bu it is still swelling.    Patient is accompained by:  Interpreter   Arely # [phone]   Currently in Pain?  Yes    Pain Location  Ankle    Pain Orientation  Left;Lateral    Pain Descriptors / Indicators  Throbbing    Pain Type  Acute pain    Aggravating Factors   activity on feet    Pain Relieving Factors  rest, elevation         [facility] PT Assessment - 07/18/19 0001      AROM   Left Ankle Dorsiflexion  -3      PROM   Right Ankle Dorsiflexion  0                   [facility] Adult PT Treatment/Exercise - 07/18/19 0001      Cryotherapy   Number Minutes Cryotherapy  10 Minutes    Cryotherapy Location  Ankle    Type of Cryotherapy  Ice pack      Ankle Exercises: Seated   Heel Raises  20 reps    Toe Raise  20 reps    Other Seated Ankle Exercises  ankel circles- needs max verbal and tactile cues       Ankle Exercises: Stretches   Gastroc  Stretch  3 reps;30 seconds   seated with towel and runners stretch     Ankle Exercises: Standing   Heel Raises  10 reps    Other Standing Ankle Exercises  forward stepping for weight shifting and heel strike /toe off , followed by gait 50 ft with cues for heel strike and equal steps       Ankle Exercises: Supine   T-Band  Theraband ankle 4-way 2x10 ea. with red Theraband             PT Education - 07/18/19 1141    Education Details  HEP    Person(s) Educated  Patient    Methods  Explanation;Handout    Comprehension  Verbalized understanding          PT Long Term Goals - 06/28/19 1201      PT LONG TERM GOAL #1   Title  Independent with HEP    Baseline  needs HEP    Time  6    Period  Weeks  Status  New    Target Date  08/09/19      PT LONG TERM GOAL #2   Title  Increase left ankle dorsiflexion AROM to at least past neutral to improve toe clearance with gait    Baseline  -6 deg    Time  6    Period  Weeks    Status  New    Target Date  08/09/19      PT LONG TERM GOAL #3   Title  Improve FOTO outcome measure score to 41% or less impairment    Baseline  74% limited    Time  6    Period  Weeks    Status  New    Target Date  08/09/19      PT LONG TERM GOAL #4   Title  Left ankle strength grossly 5/5 to improve ankle stability for outdoor ambulation over uneven surfaces    Baseline  MMTs not formally tested at eval    Time  6    Period  Weeks    Status  New    Target Date  08/09/19      PT LONG TERM GOAL #5   Title  Tolerate standing/ambulation periods at least 30-40 minutes with left ankle pain 3/10 or less to improve tolerance for chores and ability for work duties, community mobility    Baseline  7/10    Time  6    Period  Weeks    Status  New    Target Date  08/09/19            Plan - 07/18/19 1208    Clinical Impression Statement  Pt reports decreased pain however still has swelling. She is independent with RICE. Advanced HEP to include  gastroc stretching in seated and supine. Given updated handout. DF rom improved, still lacks positive rom. Weakness present in all ankle motions. Gait pattern improved. She requires max verbal cues for all therex.    PT Next Visit Plan  Continue ankle ROM, open chain strengthening for ankle, knee, hip, continue/progress standing and CKC activities as tolerated pending pain.    PT Home Exercise Plan  Medbridge: [email or web address], longsitting gastroc stretch, seated towel EV/IV, Theraband ankle 4-way-issued red Theraband, added seated and standing gastroc stretch       Patient will benefit from skilled therapeutic intervention in order to improve the following deficits and impairments:  Pain, Impaired flexibility, Decreased strength, Decreased activity tolerance, Decreased range of motion, Increased edema, Difficulty walking, Decreased balance  Visit Diagnosis: Pain in left ankle and joints of left foot  Stiffness of left ankle, not elsewhere classified  Difficulty in walking, not elsewhere classified  Localized edema     Problem List Patient Active Problem List   Diagnosis Date Noted  . Migraine headache 04/08/2018  . Hypertension 04/08/2018  . SIRS (systemic inflammatory response syndrome) ([place]) 11/08/2017  . Calculus of gallbladder without cholecystitis without obstruction 11/05/2017  . Fatty liver 11/05/2017  . Nausea and vomiting 10/28/2017  . Lower abdominal pain 10/28/2017  . Transaminitis 10/28/2017  . Hyperbilirubinemia 10/28/2017  . Leukocytosis 10/28/2017    Dorene Ar, PTA 07/18/2019, 12:14 PM  [place], [place], [address] Phone: [phone]   Fax:  [phone]  Name: Sharon Johnson MRN: [id number] Date of Birth: Mar 18, 1976

## 2019-07-20 ENCOUNTER — Other Ambulatory Visit: Payer: Self-pay

## 2019-07-20 ENCOUNTER — Ambulatory Visit: Payer: Self-pay | Attending: Family Medicine

## 2019-07-20 ENCOUNTER — Ambulatory Visit: Payer: Self-pay | Admitting: Physical Therapy

## 2019-07-20 ENCOUNTER — Encounter: Payer: Self-pay | Admitting: Physical Therapy

## 2019-07-20 DIAGNOSIS — M25572 Pain in left ankle and joints of left foot: Secondary | ICD-10-CM

## 2019-07-20 DIAGNOSIS — M25672 Stiffness of left ankle, not elsewhere classified: Secondary | ICD-10-CM

## 2019-07-20 DIAGNOSIS — R600 Localized edema: Secondary | ICD-10-CM

## 2019-07-20 DIAGNOSIS — R262 Difficulty in walking, not elsewhere classified: Secondary | ICD-10-CM

## 2019-07-20 NOTE — Therapy (Signed)
[facility] [address] [place], [place], [address] Phone: [phone]   Fax:  [phone]  Physical Therapy Treatment  Patient Details  Name: Sharon Johnson MRN: [id number] Date of Birth: 1976/05/13 Referring Provider (PT): Jari Sportsman, [place]   Encounter Date: 07/20/2019  PT End of Session - 07/20/19 0936    Visit Number  5    Number of Visits  12    Date for PT Re-Evaluation  08/09/19    Authorization Type  self-pay    PT Start Time  0930    PT Stop Time  1020    PT Time Calculation (min)  50 min       Past Medical History:  Diagnosis Date  . Hypertension   . Migraine     Past Surgical History:  Procedure Laterality Date  . CHOLECYSTECTOMY N/A 11/10/2017   Procedure: LAPAROSCOPIC CHOLECYSTECTOMY WITH INTRAOPERATIVE CHOLANGIOGRAM;  Surgeon: Luretha Murphy, MD;  Location: [facility];  Service: General;  Laterality: N/A;    There were no vitals filed for this visit.  Subjective Assessment - 07/20/19 0949    Subjective  Went for 20 minute walk and practiced balancing yesterday. Pain 7/10 afterward. 4/10 now.    Currently in Pain?  Yes    Pain Score  4     Pain Location  Ankle    Pain Orientation  Left;Lateral    Pain Descriptors / Indicators  Throbbing                       [facility] Adult PT Treatment/Exercise - 07/20/19 0001      Cryotherapy   Number Minutes Cryotherapy  10 Minutes    Cryotherapy Location  Ankle    Type of Cryotherapy  Ice pack      Manual Therapy   Manual therapy comments  PROM all planes       Ankle Exercises: Aerobic   Nustep  L4 LE only       Ankle Exercises: Standing   Rocker Board  --    Heel Raises  20 reps    Other Standing Ankle Exercises  marching, hip abduction x 10 each with UE touch, tandem stance , SLS trials     Other Standing Ankle Exercises  Foam pad rhomberg       Ankle Exercises: Supine   T-Band  Theraband ankle 4-way 2x10 ea. with red  Theraband      Ankle Exercises: Stretches   Gastroc Stretch  3 reps;30 seconds   seated with towel and runners stretch     Ankle Exercises: Seated   Heel Raises  20 reps    Toe Raise  20 reps    Other Seated Ankle Exercises  ankel circles- needs max verbal and tactile cues                   PT Long Term Goals - 06/28/19 1201      PT LONG TERM GOAL #1   Title  Independent with HEP    Baseline  needs HEP    Time  6    Period  Weeks    Status  New    Target Date  08/09/19      PT LONG TERM GOAL #2   Title  Increase left ankle dorsiflexion AROM to at least past neutral to improve toe clearance with gait    Baseline  -6 deg    Time  6    Period  Weeks    Status  New    Target Date  08/09/19      PT LONG TERM GOAL #3   Title  Improve FOTO outcome measure score to 41% or less impairment    Baseline  74% limited    Time  6    Period  Weeks    Status  New    Target Date  08/09/19      PT LONG TERM GOAL #4   Title  Left ankle strength grossly 5/5 to improve ankle stability for outdoor ambulation over uneven surfaces    Baseline  MMTs not formally tested at eval    Time  6    Period  Weeks    Status  New    Target Date  08/09/19      PT LONG TERM GOAL #5   Title  Tolerate standing/ambulation periods at least 30-40 minutes with left ankle pain 3/10 or less to improve tolerance for chores and ability for work duties, community mobility    Baseline  7/10    Time  6    Period  Weeks    Status  New    Target Date  08/09/19            Plan - 07/20/19 1034    Clinical Impression Statement  Increased closed chain challenges and balance challenges. All tolerated well. She began walking and had increased pain. Reminded her to ice and elvate afterward. She is not using ice or elevation.    PT Next Visit Plan  Continue ankle ROM, open chain strengthening for ankle, knee, hip, continue/progress standing and CKC activities as tolerated pending pain.    PT Home  Exercise Plan  Medbridge: [id number], longsitting gastroc stretch, seated towel EV/IV, Theraband ankle 4-way-issued red Theraband, added seated and standing gastroc stretch       Patient will benefit from skilled therapeutic intervention in order to improve the following deficits and impairments:  Pain, Impaired flexibility, Decreased strength, Decreased activity tolerance, Decreased range of motion, Increased edema, Difficulty walking, Decreased balance  Visit Diagnosis: Pain in left ankle and joints of left foot  Stiffness of left ankle, not elsewhere classified  Difficulty in walking, not elsewhere classified  Localized edema     Problem List Patient Active Problem List   Diagnosis Date Noted  . Migraine headache 04/08/2018  . Hypertension 04/08/2018  . SIRS (systemic inflammatory response syndrome) ([place]) 11/08/2017  . Calculus of gallbladder without cholecystitis without obstruction 11/05/2017  . Fatty liver 11/05/2017  . Nausea and vomiting 10/28/2017  . Lower abdominal pain 10/28/2017  . Transaminitis 10/28/2017  . Hyperbilirubinemia 10/28/2017  . Leukocytosis 10/28/2017    Sharon Johnson , PTA 07/20/2019, 10:37 AM  [facility] [address] [place], [place], [address] Phone: [phone]   Fax:  [phone]  Name: Sharon Johnson MRN: [id number] Date of Birth: 11/24/1975

## 2019-07-24 ENCOUNTER — Telehealth: Payer: Self-pay | Admitting: Family Medicine

## 2019-07-24 MED FILL — METHOCARBAMOL 500 MG TABS: 500 | 10 days supply | Qty: 30 | Fill #1

## 2019-07-24 MED FILL — AMLODIPINE BESYLATE 5 MG TA: 5 | 30 days supply | Qty: 30 | Fill #3

## 2019-07-24 NOTE — Telephone Encounter (Signed)
Pt was called since we recived an email from [facility] [employer] Patients pay on 10 30 2020 shows being direct deposited. Paitient would need to provide 90 days of complete bank statements to complete cafa process.. Will hold account for 14 days for patient  to comply, please  Was informed of what we need to complete the application

## 2019-07-27 ENCOUNTER — Ambulatory Visit: Payer: Self-pay | Attending: Physician Assistant | Admitting: Physical Therapy

## 2019-07-27 ENCOUNTER — Encounter: Payer: Self-pay | Admitting: Physical Therapy

## 2019-07-27 ENCOUNTER — Other Ambulatory Visit: Payer: Self-pay

## 2019-07-27 DIAGNOSIS — M25672 Stiffness of left ankle, not elsewhere classified: Secondary | ICD-10-CM | POA: Insufficient documentation

## 2019-07-27 DIAGNOSIS — R600 Localized edema: Secondary | ICD-10-CM | POA: Insufficient documentation

## 2019-07-27 DIAGNOSIS — R262 Difficulty in walking, not elsewhere classified: Secondary | ICD-10-CM | POA: Insufficient documentation

## 2019-07-27 DIAGNOSIS — M25572 Pain in left ankle and joints of left foot: Secondary | ICD-10-CM | POA: Insufficient documentation

## 2019-07-27 NOTE — Therapy (Signed)
[facility]patient [facility] [address] [place], [place], [address] Phone: [phone]   Fax:  [phone]  Physical Therapy Treatment  Patient Details  Name: Sharon [place] MRN: [id number] Date of Birth: [birth year] Referring Provider (PT): Jari Sportsman, [place]   Encounter Date: 07/27/2019  PT End of Session - 07/27/19 0951    Visit Number  6    Number of Visits  12    Date for PT Re-Evaluation  08/09/19    Authorization Type  self-pay    PT Start Time  0928    PT Stop Time  1020    PT Time Calculation (min)  52 min    Activity Tolerance  Patient tolerated treatment well    Behavior During Therapy  [facility] for tasks assessed/performed       Past Medical History:  Diagnosis Date  . Hypertension   . Migraine     Past Surgical History:  Procedure Laterality Date  . CHOLECYSTECTOMY N/A 11/10/2017   Procedure: LAPAROSCOPIC CHOLECYSTECTOMY WITH INTRAOPERATIVE CHOLANGIOGRAM;  Surgeon: Luretha Murphy, MD;  Location: [facility];  Service: General;  Laterality: N/A;    There were no vitals filed for this visit.  Subjective Assessment - 07/27/19 0930    Subjective  Ankle hurts a little bit this AM. Trying to walk at home as much as possible but having continued pain with prolonged ambulation such as at grocery store and also c/o continued swelling. She had CT scan 12/21 and meets with MD tomorrow for results.    Patient is accompained by:  Interpreter   Lizette [phone]   Currently in Pain?  Yes    Pain Score  5     Pain Location  Ankle    Pain Orientation  Left;Lateral    Pain Descriptors / Indicators  --   pulsing   Pain Type  Acute pain    Pain Onset  More than a month ago    Pain Frequency  Constant    Aggravating Factors   prolonged walking    Pain Relieving Factors  rest, elevation    Effect of Pain on Daily Activities  limits walking and standing tolerance                       [facility] Adult PT Treatment/Exercise  - 07/27/19 0001      Knee/Hip Exercises: Standing   Hip Flexion  AROM;Stengthening;Left;2 sets;10 reps;Knee straight    Hip Flexion Limitations  2 lbs.    Hip Abduction  AROM;Stengthening;Left;2 sets;10 reps    Abduction Limitations  2 lbs.    Hip Extension  AROM;Stengthening;Left;2 sets;10 reps    Extension Limitations  2 lbs.    Lateral Step Up  Left;1 set;10 reps;Step Height: 2"    Forward Step Up  Left;1 set;10 reps;Step Height: 2"    Functional Squat Limitations  "mini" squat at counter x 10 reps      Cryotherapy   Number Minutes Cryotherapy  10 Minutes    Cryotherapy Location  Ankle    Type of Cryotherapy  Ice pack      Ankle Exercises: Aerobic   Nustep  L5 LE only      Ankle Exercises: Standing   Rocker Board  2 minutes   dynamic balance 1 min ea/ latera; shifts and fw/rev    Rebounder  30 throws 1000 g ball feet together on Airex    Heel Raises  20 reps   on Airex   Other  Standing Ankle Exercises  pall off press with blue band (both handles) in partial tandem stance x 10 ea. way    Other Standing Ankle Exercises  Romberg foam eyes closed 20 sec x 3      Ankle Exercises: Stretches   Slant Board Stretch  3 reps;20 seconds      Ankle Exercises: Seated   BAPS  Sitting;Level 2;15 reps    Other Seated Ankle Exercises  seated ankle PF/DF and EV/IV on tilt board x 20 ea.             PT Education - 07/27/19 1007    Education Details  exercises, POC    Person(s) Educated  Patient    Methods  Explanation;Demonstration;Verbal cues    Comprehension  Verbalized understanding;Returned demonstration;Verbal cues required          PT Long Term Goals - 06/28/19 1201      PT LONG TERM GOAL #1   Title  Independent with HEP    Baseline  needs HEP    Time  6    Period  Weeks    Status  New    Target Date  08/09/19      PT LONG TERM GOAL #2   Title  Increase left ankle dorsiflexion AROM to at least past neutral to improve toe clearance with gait    Baseline  -6 deg     Time  6    Period  Weeks    Status  New    Target Date  08/09/19      PT LONG TERM GOAL #3   Title  Improve FOTO outcome measure score to 41% or less impairment    Baseline  74% limited    Time  6    Period  Weeks    Status  New    Target Date  08/09/19      PT LONG TERM GOAL #4   Title  Left ankle strength grossly 5/5 to improve ankle stability for outdoor ambulation over uneven surfaces    Baseline  MMTs not formally tested at eval    Time  6    Period  Weeks    Status  New    Target Date  08/09/19      PT LONG TERM GOAL #5   Title  Tolerate standing/ambulation periods at least 30-40 minutes with left ankle pain 3/10 or less to improve tolerance for chores and ability for work duties, community mobility    Baseline  7/10    Time  6    Period  Weeks    Status  New    Target Date  08/09/19            Plan - 07/27/19 1007    Clinical Impression Statement  Able to progress closed-kinetic activities today for strengthening and balance/proprioception with mild pain increase but overall well-tolerated. Improving from baseline with strength but fair status overall with continued pain with concern given continued pain for timeframe s/p injury. Plan await status at MD follow up for imaging review otherwise plan continue PT for further progression as tolerated pending pain.    Personal Factors and Comorbidities  Other    Examination-Activity Limitations  Stand;Stairs;Locomotion Level;Lift;Squat;Bend;Carry    Examination-Participation Restrictions  Shop;Cleaning;Community Activity    Stability/Clinical Decision Making  Stable/Uncomplicated    Clinical Decision Making  Low    Rehab Potential  Good    PT Frequency  2x / week    PT Duration  6 weeks    PT Treatment/Interventions  Taping;ADLs/Self Care Home Management;Cryotherapy;Electrical Stimulation;Moist Heat;Gait training;Stair training;Balance training;Therapeutic exercise;Therapeutic activities;Neuromuscular  re-education;Manual techniques;Passive range of motion;Dry needling    PT Next Visit Plan  Await status from MD visit for imaging review-CT results, Continue ankle ROM, open chain strengthening for ankle, knee, hip, continue/progress standing and CKC activities as tolerated pending pain.    PT Home Exercise Plan  Medbridge: [id number], longsitting gastroc stretch, seated towel EV/IV, Theraband ankle 4-way-issued red Theraband, added seated and standing gastroc stretch    Consulted and Agree with Plan of Care  Patient       Patient will benefit from skilled therapeutic intervention in order to improve the following deficits and impairments:  Pain, Impaired flexibility, Decreased strength, Decreased activity tolerance, Decreased range of motion, Increased edema, Difficulty walking, Decreased balance  Visit Diagnosis: Pain in left ankle and joints of left foot  Stiffness of left ankle, not elsewhere classified  Difficulty in walking, not elsewhere classified  Localized edema     Problem List Patient Active Problem List   Diagnosis Date Noted  . Migraine headache 04/08/2018  . Hypertension 04/08/2018  . SIRS (systemic inflammatory response syndrome) ([place]) 11/08/2017  . Calculus of gallbladder without cholecystitis without obstruction 11/05/2017  . Fatty liver 11/05/2017  . Nausea and vomiting 10/28/2017  . Lower abdominal pain 10/28/2017  . Transaminitis 10/28/2017  . Hyperbilirubinemia 10/28/2017  . Leukocytosis 10/28/2017    Beaulah Dinning, PT, DPT 07/27/19 11:11 AM  [facility] [address] [place], [place], [address] Phone: [phone]   Fax:  [phone]  Name: [place] MRN: [id number] Date of Birth: 24-May-1976

## 2019-07-28 ENCOUNTER — Encounter: Payer: Self-pay | Admitting: Orthopaedic Surgery

## 2019-07-28 ENCOUNTER — Ambulatory Visit (INDEPENDENT_AMBULATORY_CARE_PROVIDER_SITE_OTHER): Payer: Worker's Compensation

## 2019-07-28 ENCOUNTER — Ambulatory Visit: Payer: Self-pay | Admitting: Physical Therapy

## 2019-07-28 ENCOUNTER — Ambulatory Visit (INDEPENDENT_AMBULATORY_CARE_PROVIDER_SITE_OTHER): Payer: Worker's Compensation | Admitting: Orthopaedic Surgery

## 2019-07-28 ENCOUNTER — Encounter: Payer: Self-pay | Admitting: Physical Therapy

## 2019-07-28 VITALS — Ht 62.0 in | Wt 186.0 lb

## 2019-07-28 DIAGNOSIS — S82832A Other fracture of upper and lower end of left fibula, initial encounter for closed fracture: Secondary | ICD-10-CM | POA: Diagnosis not present

## 2019-07-28 DIAGNOSIS — M25572 Pain in left ankle and joints of left foot: Secondary | ICD-10-CM

## 2019-07-28 DIAGNOSIS — R600 Localized edema: Secondary | ICD-10-CM

## 2019-07-28 DIAGNOSIS — R262 Difficulty in walking, not elsewhere classified: Secondary | ICD-10-CM

## 2019-07-28 DIAGNOSIS — M25672 Stiffness of left ankle, not elsewhere classified: Secondary | ICD-10-CM

## 2019-07-28 NOTE — Progress Notes (Signed)
   Office Visit Note   Patient: Sharon [place]           Date of Birth: 1976-05-11           MRN: [id number] Visit Date: 07/28/2019              Requested by: Cain Saupe, MD [address] [place],  [place] [address] PCP: Cain Saupe, MD   Assessment & Plan: Visit Diagnoses:  1. Closed fracture of distal end of left fibula, unspecified fracture morphology, initial encounter     Plan: CT scan shows persistent fracture lucency of the fibula.  There is some areas of bridging bony consolidation.  Clinically speaking she is doing actually quite well and does not have pain that I would expect from a delayed union.  We will continue to try to get this healed nonoperatively.  Bone stimulator ordered today.  ASO brace for activity.  Continue with physical therapy.  Recheck in 6 weeks with three-view x-rays left ankle.  Follow-Up Instructions: Return in about 6 weeks (around 09/08/2019).   Orders:  Orders Placed This Encounter  Procedures  . XR Ankle Complete Left   No orders of the defined types were placed in this encounter.     Procedures: No procedures performed   Clinical Data: No additional findings.   Subjective: Chief Complaint  Patient presents with  . Left Ankle - Follow-up    Left distal fib fracture    Patient returns today with her interpreter for follow-up of the distal fibula fracture.  She is doing physical therapy twice a week.  Overall she is feeling better although she does have some increased discomfort with prolonged standing or activity or walking.   Review of Systems   Objective: Vital Signs: Ht 5\' 2"  (1.575 m)   Wt 186 lb (84.4 kg)   BMI 34.02 kg/m   Physical Exam  Ortho Exam Left ankle exam shows mild swelling.  Minimal tenderness along the fibula.  Good range of motion. Specialty Comments:  No specialty comments available.  Imaging: XR Ankle Complete Left  Result Date: 07/28/2019 Stable ankle mortise.  No displacement of  fibula fracture.    PMFS History: Patient Active Problem List   Diagnosis Date Noted  . Migraine headache 04/08/2018  . Hypertension 04/08/2018  . SIRS (systemic inflammatory response syndrome) ([facility]) 11/08/2017  . Calculus of gallbladder without cholecystitis without obstruction 11/05/2017  . Fatty liver 11/05/2017  . Nausea and vomiting 10/28/2017  . Lower abdominal pain 10/28/2017  . Transaminitis 10/28/2017  . Hyperbilirubinemia 10/28/2017  . Leukocytosis 10/28/2017   Past Medical History:  Diagnosis Date  . Hypertension   . Migraine     Family History  Problem Relation Age of Onset  . Hypertension Mother   . Breast cancer Sister     Past Surgical History:  Procedure Laterality Date  . CHOLECYSTECTOMY N/A 11/10/2017   Procedure: LAPAROSCOPIC CHOLECYSTECTOMY WITH INTRAOPERATIVE CHOLANGIOGRAM;  Surgeon: 11/12/2017, MD;  Location: [facility];  Service: General;  Laterality: N/A;   Social History   Occupational History  . Not on file  Tobacco Use  . Smoking status: Never Smoker  . Smokeless tobacco: Never Used  Substance and Sexual Activity  . Alcohol use: No    Alcohol/week: 0.0 standard drinks  . Drug use: No  . Sexual activity: Not Currently    Birth control/protection: I.U.D.

## 2019-07-28 NOTE — Therapy (Signed)
[facility] Outpatient Rehabilitation [facility] [address] [place], [place], [address] Phone: [phone]   Fax:  [phone]  Physical Therapy Treatment  Patient Details  Name: Sharon [place] MRN: [id number] Date of Birth: 1975/12/27 Referring Provider (PT): Jari Sportsman, [place]   Encounter Date: 07/28/2019  PT End of Session - 07/28/19 0932    Visit Number  7    Number of Visits  12    Date for PT Re-Evaluation  08/09/19    Authorization Type  self-pay    PT Start Time  0846    PT Stop Time  0938    PT Time Calculation (min)  52 min    Activity Tolerance  Patient tolerated treatment well    Behavior During Therapy  [facility] for tasks assessed/performed       Past Medical History:  Diagnosis Date  . Hypertension   . Migraine     Past Surgical History:  Procedure Laterality Date  . CHOLECYSTECTOMY N/A 11/10/2017   Procedure: LAPAROSCOPIC CHOLECYSTECTOMY WITH INTRAOPERATIVE CHOLANGIOGRAM;  Surgeon: Luretha Murphy, MD;  Location: [facility];  Service: General;  Laterality: N/A;    There were no vitals filed for this visit.  Subjective Assessment - 07/28/19 0859    Subjective  Pt. sees MD later this AM re: imaging results. No significant soreness after exercises last tx.-feels like they are helping.    Patient is accompained by:  Interpreter   Mort Sawyers [phone]        [facility] PT Assessment - 07/28/19 0001      AROM   Left Ankle Dorsiflexion  0    Left Ankle Plantar Flexion  50    Left Ankle Inversion  20    Left Ankle Eversion  10      Strength   Right/Left Ankle  Left    Left Ankle Dorsiflexion  4-/5    Left Ankle Plantar Flexion  4/5   supine only   Left Ankle Inversion  4/5    Left Ankle Eversion  4+/5                   [facility] Adult PT Treatment/Exercise - 07/28/19 0001      Knee/Hip Exercises: Standing   Hip Flexion  AROM;Stengthening;Left;2 sets;10 reps;Knee straight    Hip Flexion Limitations  2 lbs.    Hip Abduction   AROM;Stengthening;Left;2 sets;10 reps    Abduction Limitations  2 lbs.    Hip Extension  AROM;Stengthening;Left;2 sets;10 reps    Extension Limitations  2 lbs.    Lateral Step Up  Left;15 reps;Step Height: 4"    Forward Step Up  Left;15 reps;Hand Hold: 2;Step Height: 6"    Functional Squat Limitations  partial squat at counter x 15 reps      Cryotherapy   Number Minutes Cryotherapy  10 Minutes    Cryotherapy Location  Ankle    Type of Cryotherapy  Ice pack      Ankle Exercises: Aerobic   Nustep  L4 x 5 min LE only      Ankle Exercises: Standing   Rocker Board  2 minutes   dynamic balance 1 min ea/ latera; shifts and fw/rev    Rebounder  30 throws 1000 g ball feet together on Airex    Heel Raises  20 reps   on Airex   Other Standing Ankle Exercises  pall off press with blue band (both handles) in partial tandem stance x 10 ea. way    Other Standing Ankle  Exercises  Romberg foam eyes closed 20 sec x 3      Ankle Exercises: Stretches   Slant Board Stretch  3 reps;20 seconds      Ankle Exercises: Seated   BAPS  Sitting;Level 2;15 reps    Other Seated Ankle Exercises  seated ankle PF/DF and EV/IV on tilt board x 20 ea.             PT Education - 07/28/19 0932    Education Details  HEP updates, POC    Person(s) Educated  Patient    Methods  Explanation;Demonstration;Verbal cues;Handout    Comprehension  Verbalized understanding;Returned demonstration          PT Long Term Goals - 07/28/19 0937      PT LONG TERM GOAL #1   Title  Independent with HEP    Baseline  updated today, will continue to update prn    Time  6    Period  Weeks    Status  On-going      PT LONG TERM GOAL #2   Title  Increase left ankle dorsiflexion AROM to at least past neutral to improve toe clearance with gait    Baseline  0 deg/neutral    Time  6    Period  Weeks    Status  On-going      PT LONG TERM GOAL #3   Title  Improve FOTO outcome measure score to 41% or less impairment     Baseline  74% limited at eval, not retested today    Time  6    Period  Weeks    Status  On-going      PT LONG TERM GOAL #4   Title  Left ankle strength grossly 5/5 to improve ankle stability for outdoor ambulation over uneven surfaces    Baseline  see objective, goal ongoing, strength grossly 4- to 4+/5    Time  6    Period  Weeks    Status  On-going      PT LONG TERM GOAL #5   Title  Tolerate standing/ambulation periods at least 30-40 minutes with left ankle pain 3/10 or less to improve tolerance for chores and ability for work duties, community mobility    Baseline  improving but pain > 3/10    Time  6    Period  Weeks    Status  On-going            Plan - 07/28/19 0934    Clinical Impression Statement  Continued progression of standing/closed-kinetic chain strengthening and balance/proprioceptive challenges. Improving from baseline with activity tolerance and objective gains for ankle strength and ROM though still with ankle weakness, decreased ROM with associated functional limitations for prolonged standing and ambulation.    Personal Factors and Comorbidities  Other    Examination-Activity Limitations  Stand;Stairs;Locomotion Level;Lift;Squat;Bend;Carry    Examination-Participation Restrictions  Shop;Cleaning;Community Activity    Stability/Clinical Decision Making  Stable/Uncomplicated    Clinical Decision Making  Low    PT Frequency  2x / week    PT Duration  6 weeks    PT Treatment/Interventions  Taping;ADLs/Self Care Home Management;Cryotherapy;Electrical Stimulation;Moist Heat;Gait training;Stair training;Balance training;Therapeutic exercise;Therapeutic activities;Neuromuscular re-education;Manual techniques;Passive range of motion;Dry needling    PT Next Visit Plan  Await status from MD visit for imaging review-CT results, Continue ankle ROM, open chain strengthening for ankle, knee, hip, continue/progress standing and CKC activities as tolerated pending pain.    PT  Home Exercise Plan  Medbridge: [id number],  longsitting gastroc stretch, seated towel EV/IV, Theraband ankle 4-way-issued red Theraband, seated vs. standing gastroc stretch, mini squat, step ups, marches, Romberg, heel raises    Consulted and Agree with Plan of Care  Patient       Patient will benefit from skilled therapeutic intervention in order to improve the following deficits and impairments:  Pain, Impaired flexibility, Decreased strength, Decreased activity tolerance, Decreased range of motion, Increased edema, Difficulty walking, Decreased balance  Visit Diagnosis: Pain in left ankle and joints of left foot  Stiffness of left ankle, not elsewhere classified  Difficulty in walking, not elsewhere classified  Localized edema     Problem List Patient Active Problem List   Diagnosis Date Noted  . Migraine headache 04/08/2018  . Hypertension 04/08/2018  . SIRS (systemic inflammatory response syndrome) ([place]) 11/08/2017  . Calculus of gallbladder without cholecystitis without obstruction 11/05/2017  . Fatty liver 11/05/2017  . Nausea and vomiting 10/28/2017  . Lower abdominal pain 10/28/2017  . Transaminitis 10/28/2017  . Hyperbilirubinemia 10/28/2017  . Leukocytosis 10/28/2017    Beaulah Dinning, PT, DPT 07/28/19 9:40 AM  [facility] [address] [place], [place], [address] Phone: [phone]   Fax:  [phone]  Name: [place] MRN: [id number] Date of Birth: 06-23-1976

## 2019-08-09 ENCOUNTER — Ambulatory Visit: Payer: Self-pay | Admitting: Physical Therapy

## 2019-08-09 ENCOUNTER — Encounter: Payer: Self-pay | Admitting: Physical Therapy

## 2019-08-09 ENCOUNTER — Other Ambulatory Visit: Payer: Self-pay

## 2019-08-09 DIAGNOSIS — M25672 Stiffness of left ankle, not elsewhere classified: Secondary | ICD-10-CM

## 2019-08-09 DIAGNOSIS — R600 Localized edema: Secondary | ICD-10-CM

## 2019-08-09 DIAGNOSIS — M25572 Pain in left ankle and joints of left foot: Secondary | ICD-10-CM

## 2019-08-09 DIAGNOSIS — R262 Difficulty in walking, not elsewhere classified: Secondary | ICD-10-CM

## 2019-08-09 NOTE — Therapy (Signed)
[place], [place], [address] Phone: [phone]   Fax:  [phone]  Physical Therapy Treatment  Patient Details  Name: Sharon Johnson MRN: [id number] Date of Birth: 1975-11-09 Referring Provider (PT): Dwana Melena, [place]   Encounter Date: 08/09/2019  PT End of Session - 08/09/19 1109    Visit Number  8    Number of Visits  12    Date for PT Re-Evaluation  08/09/19    Authorization Type  self-pay    PT Start Time  1100    PT Stop Time  1138    PT Time Calculation (min)  38 min       Past Medical History:  Diagnosis Date  . Hypertension   . Migraine     Past Surgical History:  Procedure Laterality Date  . CHOLECYSTECTOMY N/A 11/10/2017   Procedure: LAPAROSCOPIC CHOLECYSTECTOMY WITH INTRAOPERATIVE CHOLANGIOGRAM;  Surgeon: Johnathan Hausen, MD;  Location: [facility];  Service: General;  Laterality: N/A;    There were no vitals filed for this visit.  Subjective Assessment - 08/09/19 1106    Subjective  MD said that the bone has healed in some parts and some parts it has not. He sent me to get a stimulator to use 3 hours per day. He said to contineue PT. I have a follow up in 6 weeks. [id number]    Patient is accompained by:  Interpreter   Video   Currently in Pain?  Yes    Pain Score  3     Pain Location  Ankle    Pain Orientation  Left    Aggravating Factors   prolonged walking and standing    Pain Relieving Factors  rest elevation                       [facility] Adult PT Treatment/Exercise - 08/09/19 0001      Knee/Hip Exercises: Standing   Hip Flexion  AROM;Stengthening;Right;Left;2 sets;10 reps;Knee bent    Hip Flexion Limitations  2lbs    Hip Abduction  AROM;Stengthening;Right;Left;2 sets;10 reps    Abduction Limitations  2 lbs.    Hip Extension  AROM;Stengthening;Right;Left;2 sets;10 reps    Extension Limitations  2 lbs.    Lateral Step Up  Left;15 reps;Step Height: 4"     Forward Step Up  Left;20 reps;Hand Hold: 1;Step Height: 6"    Functional Squat Limitations  partial squat at counter x 15 reps      Cryotherapy   Number Minutes Cryotherapy  10 Minutes    Cryotherapy Location  Ankle    Type of Cryotherapy  Ice pack      Ankle Exercises: Aerobic   Recumbent Bike  5 minutes L3       Ankle Exercises: Standing   Rocker Board  2 minutes   dynamic balance 1 min ea/ latera; shifts and fw/rev    Rebounder  30 throws 1000 g ball feet together on Airex    Heel Raises  20 reps   on Airex   Other Standing Ankle Exercises  pall off press with blue band (both handles) in partial tandem stance x 10 ea. way    Other Standing Ankle Exercises  Romberg foam eyes closed 20 sec x 3, staggered stance on foam , tandem on foam with intermittent min assist       Ankle Exercises: Stretches   Slant Board Stretch  3 reps;20 seconds  PT Long Term Goals - 07/28/19 1517      PT LONG TERM GOAL #1   Title  Independent with HEP    Baseline  updated today, will continue to update prn    Time  6    Period  Weeks    Status  On-going      PT LONG TERM GOAL #2   Title  Increase left ankle dorsiflexion AROM to at least past neutral to improve toe clearance with gait    Baseline  0 deg/neutral    Time  6    Period  Weeks    Status  On-going      PT LONG TERM GOAL #3   Title  Improve FOTO outcome measure score to 41% or less impairment    Baseline  74% limited at eval, not retested today    Time  6    Period  Weeks    Status  On-going      PT LONG TERM GOAL #4   Title  Left ankle strength grossly 5/5 to improve ankle stability for outdoor ambulation over uneven surfaces    Baseline  see objective, goal ongoing, strength grossly 4- to 4+/5    Time  6    Period  Weeks    Status  On-going      PT LONG TERM GOAL #5   Title  Tolerate standing/ambulation periods at least 30-40 minutes with left ankle pain 3/10 or less to improve tolerance for  chores and ability for work duties, community mobility    Baseline  improving but pain > 3/10    Time  6    Period  Weeks    Status  On-going            Plan - 08/09/19 1125    Clinical Impression Statement  Pt reports MD issued a one stimulator to assist in bone healing. He also recommended she continue PT. Will F?U in 6 weeks. COntinued with closed chain strength and balance without C/o pain. After session she reported feeling stretched out and no increased pain.  She decined modality today.    PT Next Visit Plan  Hs bone stimulator for bone healing will F/U with MD in 6 weeks (Feb 19th), Continue ankle ROM, open chain strengthening for ankle, knee, hip, continue/progress standing and CKC activities as tolerated pending pain.    PT Home Exercise Plan  Medbridge: [id number], longsitting gastroc stretch, seated towel EV/IV, Theraband ankle 4-way-issued red Theraband, seated vs. standing gastroc stretch, mini squat, step ups, marches, Romberg, heel raises       Patient will benefit from skilled therapeutic intervention in order to improve the following deficits and impairments:  Pain, Impaired flexibility, Decreased strength, Decreased activity tolerance, Decreased range of motion, Increased edema, Difficulty walking, Decreased balance  Visit Diagnosis: Stiffness of left ankle, not elsewhere classified  Difficulty in walking, not elsewhere classified  Localized edema  Pain in left ankle and joints of left foot     Problem List Patient Active Problem List   Diagnosis Date Noted  . Migraine headache 04/08/2018  . Hypertension 04/08/2018  . SIRS (systemic inflammatory response syndrome) ([facility]) 11/08/2017  . Calculus of gallbladder without cholecystitis without obstruction 11/05/2017  . Fatty liver 11/05/2017  . Nausea and vomiting 10/28/2017  . Lower abdominal pain 10/28/2017  . Transaminitis 10/28/2017  . Hyperbilirubinemia 10/28/2017  . Leukocytosis 10/28/2017    Sherrie Mustache, PTA 08/09/2019, 11:41 AM  [facility]  [address] [place], [place], [address] Phone: [phone]   Fax:  [phone]  Name: Sharon Johnson MRN: [id number] Date of Birth: 1976/06/24

## 2019-08-11 ENCOUNTER — Encounter: Payer: Self-pay | Admitting: Physical Therapy

## 2019-08-11 ENCOUNTER — Ambulatory Visit: Payer: Self-pay | Admitting: Physical Therapy

## 2019-08-11 ENCOUNTER — Other Ambulatory Visit: Payer: Self-pay

## 2019-08-11 DIAGNOSIS — M25572 Pain in left ankle and joints of left foot: Secondary | ICD-10-CM

## 2019-08-11 DIAGNOSIS — M25672 Stiffness of left ankle, not elsewhere classified: Secondary | ICD-10-CM

## 2019-08-11 DIAGNOSIS — R600 Localized edema: Secondary | ICD-10-CM

## 2019-08-11 DIAGNOSIS — R262 Difficulty in walking, not elsewhere classified: Secondary | ICD-10-CM

## 2019-08-11 NOTE — Therapy (Signed)
[place], [place], [address] Phone: [phone]   Fax:  [phone]  Physical Therapy Treatment/Recertification  Patient Details  Name: Sharon Johnson MRN: [id number] Date of Birth: [birth year] Referring Provider (PT): Dwana Melena, [place]   Encounter Date: 08/11/2019  PT End of Session - 08/11/19 1501    Visit Number  9    Number of Visits  20    Date for PT Re-Evaluation  09/22/19    Authorization Type  self-pay    PT Start Time  1100    PT Stop Time  1139    PT Time Calculation (min)  39 min    Activity Tolerance  Patient tolerated treatment well    Behavior During Therapy  [facility] for tasks assessed/performed       Past Medical History:  Diagnosis Date  . Hypertension   . Migraine     Past Surgical History:  Procedure Laterality Date  . CHOLECYSTECTOMY N/A 11/10/2017   Procedure: LAPAROSCOPIC CHOLECYSTECTOMY WITH INTRAOPERATIVE CHOLANGIOGRAM;  Surgeon: Johnathan Hausen, MD;  Location: [facility];  Service: General;  Laterality: N/A;    There were no vitals filed for this visit.  Subjective Assessment - 08/11/19 1106    Subjective  Pt. using bone stimulator per instructions from MD at last follow up given portion of fracture still healing. 3/10 pain today-activity tolerance improved from baseline but still limited with ability for prolonged standing and ambulation as well as functional limitations for activities such as lifting and stair navigation due to weakness. She has not yet been able to return to work    Patient is accompained by:  Interpreter   Salena Saner [phone]   Currently in Pain?  Yes    Pain Score  3     Pain Location  Ankle    Pain Orientation  Left    Pain Type  Acute pain    Pain Onset  More than a month ago    Pain Frequency  Constant    Aggravating Factors   standing and walking    Pain Relieving Factors  rest, elevation    Effect of Pain on Daily Activities  limits  standing and walking tolerance as well as ability for work duties         [facility] PT Assessment - 08/11/19 0001      Observation/Other Assessments   Focus on Therapeutic Outcomes (FOTO)   48% limited      AROM   Left Ankle Dorsiflexion  0    Left Ankle Plantar Flexion  40    Left Ankle Inversion  20    Left Ankle Eversion  10      Strength   Left Ankle Dorsiflexion  4+/5    Left Ankle Plantar Flexion  4/5   supine only   Left Ankle Inversion  4/5    Left Ankle Eversion  4+/5                   [facility] Adult PT Treatment/Exercise - 08/11/19 0001      Knee/Hip Exercises: Standing   Hip Flexion  AROM;Stengthening;Right;Left;2 sets;10 reps;Knee straight    Hip Flexion Limitations  2lbs    Hip Abduction  AROM;Stengthening;Right;Left;2 sets;10 reps    Abduction Limitations  2 lbs.    Hip Extension  AROM;Stengthening;Right;Left;2 sets;10 reps    Extension Limitations  2 lbs.    Lateral Step Up  Left;15 reps;Step Height: 4"    Forward Step Up  Left;20  reps;Hand Hold: 1;Step Height: 6"    Functional Squat Limitations  partial squat at counter 2x10 on Airex      Ankle Exercises: Aerobic   Recumbent Bike  5 minutes L3       Ankle Exercises: Standing   Rocker Board  2 minutes   dynamic balance 1 min ea/ latera; shifts and fw/rev    Rebounder  30 throws 1000 g ball feet together on Airex    Heel Raises  20 reps   on Airex   Other Standing Ankle Exercises  pall off press with Freemotion cable 7 lbs. x 15 ea. way      Ankle Exercises: Stretches   Slant Board Stretch  3 reps;20 seconds      Ankle Exercises: Seated   BAPS  Sitting;Level 2;15 reps             PT Education - 08/11/19 1501    Education Details  POC    Person(s) Educated  Patient    Methods  Explanation    Comprehension  Verbalized understanding          PT Long Term Goals - 08/11/19 1506      PT LONG TERM GOAL #1   Title  Independent with HEP    Baseline  met for previous HEP, will  continue to update prn    Time  6    Period  Weeks    Status  Achieved      PT LONG TERM GOAL #2   Title  Increase left ankle dorsiflexion AROM to at least past neutral to improve toe clearance with gait    Baseline  0 deg/neutral    Time  6    Period  Weeks    Status  On-going    Target Date  09/22/19      PT LONG TERM GOAL #3   Title  Improve FOTO outcome measure score to 41% or less impairment    Baseline  48% limited (74% at eval)    Time  6    Period  Weeks    Status  On-going    Target Date  09/22/19      PT LONG TERM GOAL #4   Title  Left ankle strength grossly 5/5 to improve ankle stability for outdoor ambulation over uneven surfaces    Baseline  see objective, goal ongoing    Time  6    Status  On-going    Target Date  09/22/19      PT LONG TERM GOAL #5   Title  Tolerate standing/ambulation periods at least 30-40 minutes with left ankle pain 3/10 or less to improve tolerance for chores and ability for work duties, community mobility    Baseline  improving but pain > 3/10 with prolonged ambulation and standing    Time  6    Period  Weeks    Status  On-going    Target Date  09/22/19            Plan - 08/11/19 1502    Clinical Impression Statement  Patient has shown gradual progress with tolerance standing and closed-kinetic chain activites s/p left fibular fracture with functional gains as evidenced by 26% improvement in FOTO score from baseline. Though improved from previous status still with left ankle weakness, stiffness and decreased proprioceptive ability with associated functional limitations for prolonged ambulation and standing, lifting with associated impact on ability for work duties. Pt. would benefit from continued PT for further work  on ankle strength, ROM and balance/proprioceptive challnges to address remaining functional limitations and assist return to work.    Personal Factors and Comorbidities  Other    Examination-Activity Limitations   Stand;Stairs;Locomotion Level;Lift;Squat;Bend;Carry    Examination-Participation Restrictions  Shop;Cleaning;Community Activity    Stability/Clinical Decision Making  Evolving/Moderate complexity   due to delayed bone healing   Clinical Decision Making  Low    Rehab Potential  Good    PT Frequency  2x / week    PT Duration  6 weeks    PT Treatment/Interventions  Taping;ADLs/Self Care Home Management;Cryotherapy;Electrical Stimulation;Moist Heat;Gait training;Stair training;Balance training;Therapeutic exercise;Therapeutic activities;Neuromuscular re-education;Manual techniques;Passive range of motion;Dry needling    PT Next Visit Plan  Hs bone stimulator for bone healing will F/U with MD in 6 weeks (Feb 19th), Continue ankle ROM, open chain strengthening for ankle, knee, hip, continue/progress standing and CKC activities as tolerated pending pain.    PT Home Exercise Plan  Medbridge: [id number], longsitting gastroc stretch, seated towel EV/IV, Theraband ankle 4-way-issued red Theraband, seated vs. standing gastroc stretch, mini squat, step ups, marches, Romberg, heel raises    Consulted and Agree with Plan of Care  Patient       Patient will benefit from skilled therapeutic intervention in order to improve the following deficits and impairments:  Pain, Impaired flexibility, Decreased strength, Decreased activity tolerance, Decreased range of motion, Increased edema, Difficulty walking, Decreased balance  Visit Diagnosis: Stiffness of left ankle, not elsewhere classified  Pain in left ankle and joints of left foot  Difficulty in walking, not elsewhere classified  Localized edema     Problem List Patient Active Problem List   Diagnosis Date Noted  . Migraine headache 04/08/2018  . Hypertension 04/08/2018  . SIRS (systemic inflammatory response syndrome) ([facility]) 11/08/2017  . Calculus of gallbladder without cholecystitis without obstruction 11/05/2017  . Fatty liver 11/05/2017  . Nausea  and vomiting 10/28/2017  . Lower abdominal pain 10/28/2017  . Transaminitis 10/28/2017  . Hyperbilirubinemia 10/28/2017  . Leukocytosis 10/28/2017    Beaulah Dinning, PT, DPT 08/11/19 3:09 PM  [facility] [address] [place], [place], [address] Phone: [phone]   Fax:  [phone]  Name: Sharon Johnson MRN: [id number] Date of Birth: 1976-07-10

## 2019-08-14 ENCOUNTER — Telehealth: Payer: Self-pay | Admitting: Orthopaedic Surgery

## 2019-08-14 NOTE — Telephone Encounter (Signed)
Patient stated she had told Roda Shutters she wanted to go back to work and she has noticed doing simple housework has made her foot swell.  She wants to know if she can have time extended and fill out a work form.  Please call patient to advise  [phone]

## 2019-08-15 ENCOUNTER — Encounter: Payer: Self-pay | Admitting: Physical Therapy

## 2019-08-15 ENCOUNTER — Ambulatory Visit: Payer: Self-pay | Admitting: Physical Therapy

## 2019-08-15 ENCOUNTER — Other Ambulatory Visit: Payer: Self-pay

## 2019-08-15 DIAGNOSIS — R262 Difficulty in walking, not elsewhere classified: Secondary | ICD-10-CM

## 2019-08-15 DIAGNOSIS — R600 Localized edema: Secondary | ICD-10-CM

## 2019-08-15 DIAGNOSIS — M25672 Stiffness of left ankle, not elsewhere classified: Secondary | ICD-10-CM

## 2019-08-15 DIAGNOSIS — M25572 Pain in left ankle and joints of left foot: Secondary | ICD-10-CM

## 2019-08-15 NOTE — Therapy (Signed)
[place], [place], [address] Phone: [phone]   Fax:  [phone]  Physical Therapy Treatment  Patient Details  Name: Sharon Johnson MRN: [id number] Date of Birth: [birth year] Referring Provider (PT): Dwana Melena, [place]   Encounter Date: 08/15/2019  PT End of Session - 08/15/19 1238    Visit Number  10    Number of Visits  20    Date for PT Re-Evaluation  09/22/19    Authorization Type  self-pay    PT Start Time  1230    PT Stop Time  1308    PT Time Calculation (min)  38 min       Past Medical History:  Diagnosis Date  . Hypertension   . Migraine     Past Surgical History:  Procedure Laterality Date  . CHOLECYSTECTOMY N/A 11/10/2017   Procedure: LAPAROSCOPIC CHOLECYSTECTOMY WITH INTRAOPERATIVE CHOLANGIOGRAM;  Surgeon: Johnathan Hausen, MD;  Location: [facility];  Service: General;  Laterality: N/A;    There were no vitals filed for this visit.  Subjective Assessment - 08/15/19 1243    Subjective  Increased pain last Friday maybe because I cooked and cleaned my house, I don't know.    Currently in Pain?  Yes    Pain Score  4     Pain Location  Ankle    Pain Orientation  Left    Pain Descriptors / Indicators  Aching    Aggravating Factors   cleaning, standing    Pain Relieving Factors  rest, elevation                       [facility] Adult PT Treatment/Exercise - 08/15/19 0001      Ambulation/Gait   Stairs  Yes    Stairs Assistance  7: Independent    Stair Management Technique  No rails;Step to pattern    Number of Stairs  12    Height of Stairs  6      Knee/Hip Exercises: Aerobic   Elliptical  --      Knee/Hip Exercises: Standing   Forward Step Up  Left;20 reps;Hand Hold: 1;Step Height: 6"    Step Down  Left;10 reps;Hand Hold: 1;Step Height: 4"    Functional Squat Limitations  partial squat at counter 2x10 on Airex      Manual Therapy   Manual therapy  comments  Passive DF x 3       Ankle Exercises: Aerobic   Elliptical  L1 Ramp1 x 5 minutes, stationary arms      Ankle Exercises: Standing   Rocker Board  2 minutes   dynamic balance 1 min ea/ latera; shifts and fw/rev    Rebounder  reb baall SLS 4 toss best     Heel Raises  20 reps    Other Standing Ankle Exercises  pall off press with Freemotion cable 7 lbs. x 15 ea. way    Other Standing Ankle Exercises  SLS and tandem trials on and off foam, EO/EC      Ankle Exercises: Stretches   Slant Board Stretch  3 reps;20 seconds      Ankle Exercises: Supine   T-Band  red band 4 way x 20                   PT Long Term Goals - 08/11/19 1506      PT LONG TERM GOAL #1   Title  Independent with HEP  Baseline  met for previous HEP, will continue to update prn    Time  6    Period  Weeks    Status  Achieved      PT LONG TERM GOAL #2   Title  Increase left ankle dorsiflexion AROM to at least past neutral to improve toe clearance with gait    Baseline  0 deg/neutral    Time  6    Period  Weeks    Status  On-going    Target Date  09/22/19      PT LONG TERM GOAL #3   Title  Improve FOTO outcome measure score to 41% or less impairment    Baseline  48% limited (74% at eval)    Time  6    Period  Weeks    Status  On-going    Target Date  09/22/19      PT LONG TERM GOAL #4   Title  Left ankle strength grossly 5/5 to improve ankle stability for outdoor ambulation over uneven surfaces    Baseline  see objective, goal ongoing    Time  6    Status  On-going    Target Date  09/22/19      PT LONG TERM GOAL #5   Title  Tolerate standing/ambulation periods at least 30-40 minutes with left ankle pain 3/10 or less to improve tolerance for chores and ability for work duties, community mobility    Baseline  improving but pain > 3/10 with prolonged ambulation and standing    Time  6    Period  Weeks    Status  On-going    Target Date  09/22/19            Plan - 08/15/19  1315    Clinical Impression Statement  Pt reports ome increased pain last Friday maybe from cleaning her home and cooking. SHe rates her pain at 4/10 today. Continued with ankle strength and stability. On stairs she is climbing reciprocally on 6 inch stairs however descends with step to pattern. Began work on 4 inch step downs which she did well with verbal cues and demonstration.    PT Next Visit Plan  Hs bone stimulator for bone healing will F/U with MD in 6 weeks (Feb 19th), Continue ankle ROM, open chain strengthening for ankle, knee, hip, continue/progress standing and CKC activities as tolerated pending pain.    PT Home Exercise Plan  Medbridge: [id number], longsitting gastroc stretch, seated towel EV/IV, Theraband ankle 4-way-issued red Theraband, seated vs. standing gastroc stretch, mini squat, step ups, marches, Romberg, heel raises       Patient will benefit from skilled therapeutic intervention in order to improve the following deficits and impairments:     Visit Diagnosis: Pain in left ankle and joints of left foot  Stiffness of left ankle, not elsewhere classified  Difficulty in walking, not elsewhere classified  Localized edema     Problem List Patient Active Problem List   Diagnosis Date Noted  . Migraine headache 04/08/2018  . Hypertension 04/08/2018  . SIRS (systemic inflammatory response syndrome) ([facility]) 11/08/2017  . Calculus of gallbladder without cholecystitis without obstruction 11/05/2017  . Fatty liver 11/05/2017  . Nausea and vomiting 10/28/2017  . Lower abdominal pain 10/28/2017  . Transaminitis 10/28/2017  . Hyperbilirubinemia 10/28/2017  . Leukocytosis 10/28/2017    Dorene Ar, PTA 08/15/2019, 1:21 PM  [place], [place], [address] Phone: [phone]  Fax:  [phone]  Name: Sharon Johnson MRN: [id number] Date of Birth: Nov 01, 1975

## 2019-08-15 NOTE — Telephone Encounter (Signed)
That's normal to experience due to her healing fracture

## 2019-08-16 NOTE — Telephone Encounter (Signed)
ok 

## 2019-08-16 NOTE — Telephone Encounter (Signed)
Would like to be out of work until 09/08/2019.

## 2019-08-17 ENCOUNTER — Telehealth: Payer: Self-pay

## 2019-08-17 ENCOUNTER — Other Ambulatory Visit: Payer: Self-pay

## 2019-08-17 ENCOUNTER — Encounter: Payer: Self-pay | Admitting: Physical Therapy

## 2019-08-17 ENCOUNTER — Ambulatory Visit: Payer: Self-pay | Admitting: Physical Therapy

## 2019-08-17 DIAGNOSIS — M25672 Stiffness of left ankle, not elsewhere classified: Secondary | ICD-10-CM

## 2019-08-17 DIAGNOSIS — M25572 Pain in left ankle and joints of left foot: Secondary | ICD-10-CM

## 2019-08-17 DIAGNOSIS — R262 Difficulty in walking, not elsewhere classified: Secondary | ICD-10-CM

## 2019-08-17 DIAGNOSIS — R600 Localized edema: Secondary | ICD-10-CM

## 2019-08-17 NOTE — Therapy (Signed)
[place], [place], [address] Phone: [phone]   Fax:  [phone]  Physical Therapy Treatment  Patient Details  Name: Sharon Johnson MRN: [id number] Date of Birth: [birth year] Referring Provider (PT): Dwana Melena, [place]   Encounter Date: 08/17/2019  PT End of Session - 08/17/19 1129    Visit Number  11    Number of Visits  20    Date for PT Re-Evaluation  09/22/19    Authorization Type  self-pay    PT Start Time  1128    PT Stop Time  1208    PT Time Calculation (min)  40 min    Activity Tolerance  Patient tolerated treatment well    Behavior During Therapy  [facility] for tasks assessed/performed       Past Medical History:  Diagnosis Date  . Hypertension   . Migraine     Past Surgical History:  Procedure Laterality Date  . CHOLECYSTECTOMY N/A 11/10/2017   Procedure: LAPAROSCOPIC CHOLECYSTECTOMY WITH INTRAOPERATIVE CHOLANGIOGRAM;  Surgeon: Johnathan Hausen, MD;  Location: [facility];  Service: General;  Laterality: N/A;    There were no vitals filed for this visit.  Subjective Assessment - 08/17/19 1126    Subjective  "I have still have some pain." A little soreness after last session but reiterates soreness in reported from last Friday over last weekend.    Patient is accompained by:  Interpreter   Nevin Bloodgood [phone]   Pertinent History  HTN                       [facility] Adult PT Treatment/Exercise - 08/17/19 0001      Knee/Hip Exercises: Standing   Forward Step Up  Left;20 reps;Hand Hold: 1;Step Height: 6"    Step Down  15 reps;Hand Hold: 1;Step Height: 2"    Step Down Limitations  initially tried right side step down with left foot on step with 4 in. step but too sore-switched to 2 in. steo with good tolerance    Functional Squat Limitations  partial squat at counter 2x10 on Airex   cues for angle of knee flexion     Knee/Hip Exercises: Seated   Long Arc Quad   Strengthening;Left;2 sets;10 reps    Long Arc Quad Weight  4 lbs.      Knee/Hip Exercises: Supine   Straight Leg Raises  AROM;Strengthening;Left;2 sets;10 reps    Straight Leg Raises Limitations  cues to keep knee extended      Ankle Exercises: Aerobic   Elliptical  L1 Ramp1 x 5 minutes, stationary arms      Ankle Exercises: Standing   Rocker Board  2 minutes   dynamic balance 1 min ea/ latera; shifts and fw/rev    Rebounder  feet together on Airex 1000g ball x 30 throws    Heel Raises  20 reps   Airex   Other Standing Ankle Exercises  pall off press feet togther on Airex with Freemotion cable 7 lbs. x 15 ea. way   mod cues to extend elbows and avoid trunk rotation   Other Standing Ankle Exercises  Romberg faom eyes closed 20 sec x 3, tandem stance on Airex with head turns x 15, left SLS on green Theraband pad with 3-way vector steps 2x15      Ankle Exercises: Stretches   Slant Board Stretch  3 reps;20 seconds      Ankle Exercises: Supine   T-Band  red band 4  way x 20              PT Education - 08/17/19 1216    Education Details  exercise form    Person(s) Educated  Patient    Methods  Explanation;Demonstration;Verbal cues    Comprehension  Verbalized understanding;Returned demonstration;Verbal cues required          PT Long Term Goals - 08/11/19 1506      PT LONG TERM GOAL #1   Title  Independent with HEP    Baseline  met for previous HEP, will continue to update prn    Time  6    Period  Weeks    Status  Achieved      PT LONG TERM GOAL #2   Title  Increase left ankle dorsiflexion AROM to at least past neutral to improve toe clearance with gait    Baseline  0 deg/neutral    Time  6    Period  Weeks    Status  On-going    Target Date  09/22/19      PT LONG TERM GOAL #3   Title  Improve FOTO outcome measure score to 41% or less impairment    Baseline  48% limited (74% at eval)    Time  6    Period  Weeks    Status  On-going    Target Date  09/22/19       PT LONG TERM GOAL #4   Title  Left ankle strength grossly 5/5 to improve ankle stability for outdoor ambulation over uneven surfaces    Baseline  see objective, goal ongoing    Time  6    Status  On-going    Target Date  09/22/19      PT LONG TERM GOAL #5   Title  Tolerate standing/ambulation periods at least 30-40 minutes with left ankle pain 3/10 or less to improve tolerance for chores and ability for work duties, community mobility    Baseline  improving but pain > 3/10 with prolonged ambulation and standing    Time  6    Period  Weeks    Status  On-going    Target Date  09/22/19            Plan - 08/17/19 1216    Clinical Impression Statement  Tried step down with 4 in step again leading with RLE with pt. noting some soreness so switched to 2 in. step with good tolerance. Progressing with balance/proprcetive abilities and strength but still with weakness impacting ability eccentric control with stair navigation,    Personal Factors and Comorbidities  Other    Examination-Activity Limitations  Stand;Stairs;Locomotion Level;Lift;Squat;Bend;Carry    Examination-Participation Restrictions  Shop;Cleaning;Community Activity    Stability/Clinical Decision Making  Evolving/Moderate complexity    Clinical Decision Making  Low    Rehab Potential  Good    PT Frequency  2x / week    PT Duration  6 weeks    PT Treatment/Interventions  Taping;ADLs/Self Care Home Management;Cryotherapy;Electrical Stimulation;Moist Heat;Gait training;Stair training;Balance training;Therapeutic exercise;Therapeutic activities;Neuromuscular re-education;Manual techniques;Passive range of motion;Dry needling    PT Next Visit Plan  Hs bone stimulator for bone healing will F/U with MD in 6 weeks (Feb 19th), Continue ankle ROM, open chain strengthening for ankle, knee, hip, continue/progress standing and CKC activities as tolerated pending pain.    PT Home Exercise Plan  Medbridge: [id number], longsitting gastroc  stretch, seated towel EV/IV, Theraband ankle 4-way-issued red Theraband, seated vs. standing gastroc stretch, mini  squat, step ups, marches, Romberg, heel raises    Consulted and Agree with Plan of Care  Patient       Patient will benefit from skilled therapeutic intervention in order to improve the following deficits and impairments:  Pain, Impaired flexibility, Decreased strength, Decreased activity tolerance, Decreased range of motion, Increased edema, Difficulty walking, Decreased balance  Visit Diagnosis: Pain in left ankle and joints of left foot  Stiffness of left ankle, not elsewhere classified  Difficulty in walking, not elsewhere classified  Localized edema     Problem List Patient Active Problem List   Diagnosis Date Noted  . Migraine headache 04/08/2018  . Hypertension 04/08/2018  . SIRS (systemic inflammatory response syndrome) ([facility]) 11/08/2017  . Calculus of gallbladder without cholecystitis without obstruction 11/05/2017  . Fatty liver 11/05/2017  . Nausea and vomiting 10/28/2017  . Lower abdominal pain 10/28/2017  . Transaminitis 10/28/2017  . Hyperbilirubinemia 10/28/2017  . Leukocytosis 10/28/2017    Beaulah Dinning, PT, DPT 08/17/19 12:20 PM  [facility] [address] [place], [place], [address] Phone: [phone]   Fax:  [phone]  Name: VANIAH CHAMBERS MRN: [id number] Date of Birth: [birth year]

## 2019-08-17 NOTE — Telephone Encounter (Signed)
Faxed work note to [phone] per patients request.

## 2019-08-17 NOTE — Telephone Encounter (Signed)
Note ready. Patient aware.

## 2019-08-18 ENCOUNTER — Ambulatory Visit: Payer: Self-pay | Attending: Internal Medicine

## 2019-08-18 DIAGNOSIS — Z20822 Contact with and (suspected) exposure to covid-19: Secondary | ICD-10-CM | POA: Insufficient documentation

## 2019-08-19 LAB — NOVEL CORONAVIRUS, NAA: SARS-CoV-2, NAA: NOT DETECTED

## 2019-08-23 ENCOUNTER — Ambulatory Visit: Payer: Self-pay | Attending: Family Medicine | Admitting: Physician Assistant

## 2019-08-23 ENCOUNTER — Ambulatory Visit: Payer: Self-pay | Admitting: Physical Therapy

## 2019-08-23 ENCOUNTER — Other Ambulatory Visit: Payer: Self-pay

## 2019-08-23 DIAGNOSIS — G4700 Insomnia, unspecified: Secondary | ICD-10-CM

## 2019-08-23 DIAGNOSIS — R454 Irritability and anger: Secondary | ICD-10-CM

## 2019-08-23 DIAGNOSIS — M62838 Other muscle spasm: Secondary | ICD-10-CM

## 2019-08-23 DIAGNOSIS — I10 Essential (primary) hypertension: Secondary | ICD-10-CM

## 2019-08-23 DIAGNOSIS — E785 Hyperlipidemia, unspecified: Secondary | ICD-10-CM

## 2019-08-23 MED ORDER — CYCLOBENZAPRINE HCL 5 MG PO TABS: ORAL_TABLET | ORAL | 1 refills | Status: DC

## 2019-08-23 MED FILL — AMLODIPINE BESYLATE 5 MG TA: 5 | 30 days supply | Qty: 30 | Fill #4

## 2019-08-23 MED FILL — CYCLOBENZAPRINE 5 MG TABLET: 5 | 15 days supply | Qty: 30 | Fill #0

## 2019-08-23 NOTE — Progress Notes (Signed)
Medical Assistant used [employer] to contact patient.  Interpreter Name: Ephriam Knuckles Interpreter #: [phone] Patient verified DOB Patient has    Patient has  Patient complains of HA being present last week and having dizziness with right eye having shooting pain. Pain is not present at this time as severe. Slight HA is present.

## 2019-08-23 NOTE — Progress Notes (Signed)
Virtual Visit via Telephone Note  I connected with Sharon Johnson on 08/23/19 at 10:50 AM EST by telephone and verified that I am speaking with the correct person using two identifiers.   I discussed the limitations, risks, security and privacy concerns of performing an evaluation and management service by telephone and the availability of in person appointments. I also discussed with the patient that there may be a patient responsible charge related to this service. The patient expressed understanding and agreed to proceed.  PATIENT visit by telephone virtually in the context of Covid-19 pandemic. Patient location:  home My Location:  [facility] office Persons on the call:  Me and the patient and Christian the interpreter  History of Present Illness:  She has been having some HA and difficulty sleeping.  Check BP at [facility] and was 130-140.  She doesn't remember the bottom number.  She has been having difficulty sleeping for about 3 months.    She has been feeling irritable and gets angry easily.  Denies SI/HI.      Observations/Objective:  NAD.  A&Ox3   Assessment and Plan: 1. Muscle spasm - cyclobenzaprine (FLEXERIL) 5 MG tablet; 1-2 at bedtime prn muscle spasm  Dispense: 30 tablet; Refill: 1  2. Insomnia, unspecified type Likely due to number 1 - cyclobenzaprine (FLEXERIL) 5 MG tablet; 1-2 at bedtime prn muscle spasm  Dispense: 30 tablet; Refill: 1  3. Essential hypertension Continue amlodipine 5mg  daily and check BP OOO 3 times weekly and record and bring to next visit  4. Irritable behavior Discussed self-care, proper diet, exercise - Ambulatory referral to Social Work  5. Hyperlipidemia, unspecified hyperlipidemia type Continue lipitor  Follow Up Instructions: See PCP in 3-4 weeks   I discussed the assessment and treatment plan with the patient. The patient was provided an opportunity to ask questions and all were answered. The patient agreed with the plan and  demonstrated an understanding of the instructions.   The patient was advised to call back or seek an in-person evaluation if the symptoms worsen or if the condition fails to improve as anticipated.  I provided 15 minutes of non-face-to-face time during this encounter.   , PA-C  Patient ID: Sharon Johnson, female   DOB: 07/31/1975, 44 y.o.   MRN: [id number]

## 2019-08-25 ENCOUNTER — Ambulatory Visit: Payer: Self-pay | Admitting: Physical Therapy

## 2019-08-30 ENCOUNTER — Ambulatory Visit: Payer: Self-pay | Admitting: Physical Therapy

## 2019-09-01 ENCOUNTER — Ambulatory Visit: Payer: Self-pay | Admitting: Physical Therapy

## 2019-09-06 ENCOUNTER — Ambulatory Visit: Payer: Self-pay | Admitting: Physical Therapy

## 2019-09-08 ENCOUNTER — Telehealth: Payer: Self-pay | Admitting: Licensed Clinical Social Worker

## 2019-09-08 ENCOUNTER — Ambulatory Visit: Payer: Self-pay | Admitting: Orthopaedic Surgery

## 2019-09-08 ENCOUNTER — Ambulatory Visit: Payer: Self-pay | Admitting: Physical Therapy

## 2019-09-08 NOTE — Telephone Encounter (Addendum)
This MSW Intern placed call to patient regarding social work referral placed by [employer]. Used [employer], [place], #[id number]  Patient states her sleep has improved since appointment with [facility] 08/23/19 as a result of medications prescribed during encounter. Pt reports psychosocial stressors triggered by fall 04/17/20. She has been unable to work since her injury. Pt is uninsured and unemployed due to accident. Patient states she was additionally denied CAFA due to no active bank account. This MSW Intern will inquire about patient's eligibility to reapply for CAFA.  Patient reports interest in therapy; however, she has accumulated medical debt as a result of injury and states she cannot afford to accrue additional debt. This MSW Intern offered free services due to intern status and informed patient of sliding scale therapy at [facility].  Patient stated a preference for initiating brief therapy with this MSW Intern. She is scheduled for a behavioral health consult with this MSW Intern 09/20/19.   This MSW Intern  sent patient food resources, [employer], and a [employer] of the [employer] via e-mail. No additional concerns noted.

## 2019-09-13 ENCOUNTER — Ambulatory Visit: Payer: Self-pay | Admitting: Family Medicine

## 2019-09-13 ENCOUNTER — Ambulatory Visit: Payer: Self-pay | Admitting: Physical Therapy

## 2019-09-15 ENCOUNTER — Ambulatory Visit (INDEPENDENT_AMBULATORY_CARE_PROVIDER_SITE_OTHER): Payer: Worker's Compensation

## 2019-09-15 ENCOUNTER — Ambulatory Visit (INDEPENDENT_AMBULATORY_CARE_PROVIDER_SITE_OTHER): Payer: Worker's Compensation | Admitting: Orthopaedic Surgery

## 2019-09-15 ENCOUNTER — Ambulatory Visit: Payer: Self-pay | Admitting: Physical Therapy

## 2019-09-15 ENCOUNTER — Encounter: Payer: Self-pay | Admitting: Orthopaedic Surgery

## 2019-09-15 ENCOUNTER — Other Ambulatory Visit: Payer: Self-pay

## 2019-09-15 VITALS — Ht 62.0 in | Wt 186.0 lb

## 2019-09-15 DIAGNOSIS — S82832G Other fracture of upper and lower end of left fibula, subsequent encounter for closed fracture with delayed healing: Secondary | ICD-10-CM

## 2019-09-15 NOTE — Progress Notes (Signed)
Office Visit Note   Patient: Sharon Johnson           Date of Birth: 1976/05/01           MRN: [id number] Visit Date: 09/15/2019              Requested by: Cain Saupe, MD [address] [place],  [place] [address] PCP: Cain Saupe, MD   Assessment & Plan: Visit Diagnoses:  1. Closed fracture of distal end of left fibula with delayed healing, unspecified fracture morphology, subsequent encounter     Plan: Impression is delayed union left distal fibula fracture but clinically she is doing quite well.  The patient will continue using her bone stimulator for 3 hours a day.  She will wear her ASO with increased activity.  Continue with her home exercise program.  Follow-up with [place] in 2 months time for repeat evaluation and 3 view x-rays of the left ankle.  Follow-Up Instructions: Return in about 2 months (around 11/13/2019).   Orders:  Orders Placed This Encounter  Procedures  . XR Ankle Complete Left   No orders of the defined types were placed in this encounter.     Procedures: No procedures performed   Clinical Data: No additional findings.   Subjective: Chief Complaint  Patient presents with  . Left Ankle - Follow-up    HPI patient is a pleasant 44 year old Spanish-speaking female who comes in today approximately 5-1/2 months out delayed union left fibula fracture.  She is here with a Spanish-speaking interpreter.  She has been doing okay.  She has been ambulating unassisted.  She is only wearing her ASO when she walking outside for period of time.  She has been using her bone stimulator 3 hours a day for the past 6 weeks.  She notes that she has not been doing formal physical therapy scheduling conflicts but she has been working on home exercise program.  Overall, she feels as though she is improving.  Review of Systems as detailed in HPI.  All others reviewed and are negative.   Objective: Vital Signs: Ht 5\' 2"  (1.575 m)   Wt 186 lb (84.4 kg)   BMI  34.02 kg/m   Physical Exam well-developed well-nourished female no acute distress.  Alert and oriented x3.  Ortho Exam examination of her left ankle reveals mild swelling.  Mild to moderate tenderness to the fracture site.  Has fairly good range of motion but does have pain with inversion and eversion of the ankle.  She is neurovascular intact distally.  Specialty Comments:  No specialty comments available.  Imaging: XR Ankle Complete Left  Result Date: 09/15/2019 X-rays demonstrate continued bony consolidation at the fracture site    PMFS History: Patient Active Problem List   Diagnosis Date Noted  . Migraine headache 04/08/2018  . Hypertension 04/08/2018  . SIRS (systemic inflammatory response syndrome) ([facility]) 11/08/2017  . Calculus of gallbladder without cholecystitis without obstruction 11/05/2017  . Fatty liver 11/05/2017  . Nausea and vomiting 10/28/2017  . Lower abdominal pain 10/28/2017  . Transaminitis 10/28/2017  . Hyperbilirubinemia 10/28/2017  . Leukocytosis 10/28/2017   Past Medical History:  Diagnosis Date  . Hypertension   . Migraine     Family History  Problem Relation Age of Onset  . Hypertension Mother   . Breast cancer Sister     Past Surgical History:  Procedure Laterality Date  . CHOLECYSTECTOMY N/A 11/10/2017   Procedure: LAPAROSCOPIC CHOLECYSTECTOMY WITH INTRAOPERATIVE CHOLANGIOGRAM;  Surgeon: 11/12/2017,  MD;  Location: [facility];  Service: General;  Laterality: N/A;   Social History   Occupational History  . Not on file  Tobacco Use  . Smoking status: Never Smoker  . Smokeless tobacco: Never Used  Substance and Sexual Activity  . Alcohol use: No    Alcohol/week: 0.0 standard drinks  . Drug use: No  . Sexual activity: Not Currently    Birth control/protection: I.U.D.

## 2019-09-20 ENCOUNTER — Ambulatory Visit: Payer: Self-pay | Attending: Family Medicine | Admitting: Licensed Clinical Social Worker

## 2019-09-20 ENCOUNTER — Other Ambulatory Visit: Payer: Self-pay

## 2019-09-20 DIAGNOSIS — F439 Reaction to severe stress, unspecified: Secondary | ICD-10-CM

## 2019-09-22 NOTE — BH Specialist Note (Signed)
Integrated Behavioral Health Visit via Telemedicine (Telephone)  09/22/2019 Sharon Johnson [id number]   Session Start time: 2:00pm  Session End time: 2:33pm Total time: 30  Referring Provider: PA McClung Type of Visit: Telephonic Patient location: Home [facility] Provider location: [facility] office All persons participating in visit: Patient and MSW Intern  Confirmed patient's address: Yes  Confirmed patient's phone number: Yes  Any changes to demographics: No   Confirmed patient's insurance: Yes  Any changes to patient's insurance: No   Discussed confidentiality: Yes    The following statements were read to the patient and/or legal guardian that are established with the [facility] Provider.  "The purpose of this phone visit is to provide behavioral health care while limiting exposure to the coronavirus (COVID19).  There is a possibility of technology failure and discussed alternative modes of communication if that failure occurs."  "By engaging in this telephone visit, you consent to the provision of healthcare.  Additionally, you authorize for your insurance to be billed for the services provided during this telephone visit."   Patient and/or legal guardian consented to telephone visit: Yes   PRESENTING CONCERNS: Patient and/or family reports the following symptoms/concerns: Patient reports financial stress due to inability to work after sustaining a fracture to her ankle 04/17/20.  Duration of problem: 6 months; Severity of problem: moderate  STRENGTHS (Protective Factors/Coping Skills): Patient receives support from daughter and sister-in law Patient finds strength in her religious beliefs Patient attends doctor's appointments    GOALS ADDRESSED: Patient will: 1.  Reduce symptoms of: stress  2.  Increase knowledge and/or ability of: coping skills  3.  Demonstrate ability to: Increase healthy adjustment to current life circumstances and Increase adequate support  systems for patient/family  INTERVENTIONS: Interventions utilized:  Supportive Counseling and Link to [employer] Standardized Assessments completed: GAD-7 and PHQ 2&9  ASSESSMENT: Patient currently experiencing financial stress and ongoing sleep disturbance triggered by accident 9/21 when she fractured her ankle. Patient is unable to work due to ongoing pain/ambulation issues. Patient applied for [facility] but was denied 1/21 because she did not provide bank account statements. Patient states she used her daughter's bank account when she filed taxes in 2020, which is reflective of her daughter's income, not the patient's. This MSW Intern provided emotional support regarding patient's recent experience of barriers to medical care.  This MSW Intern provided psycho-education about the correlation between physical and mental health. Food resources and information about [employer] of the [place] counseling services were provided. Coping skills to manage stress were identified.  Patient may benefit from re-applying for [facility] Financial Assistance when she meets eligibility period. This MSW Intern encouraged patient to re-apply 6 months from her initial application per suggestion from financial assistance counselor, Mikle Bosworth at [facility].   PLAN: 1. Follow up with behavioral health clinician on : MSW Intern encouraged patient to contact this MSW Intern or Jenel Lucks, LCSW if she is in need of additional support. 2. Behavioral recommendations: Patient encouraged to re-apply for [facility] Financial Assistance when she meets re-eligibility criteria. Patient encouraged to practice coping skills discussed in session.  3. Referral(s): Paramedic (LME/Outside Clinic) and Community Resources:  Food  Norberto Sorenson MSW Intern  09/22/19 10:53am

## 2019-09-28 NOTE — Therapy (Signed)
[facility] [address] [place], [place], [address] Phone: [phone]   Fax:  [phone]  Physical Therapy Treatment/Discharge  Patient Details  Name: Sharon Johnson MRN: [id number] Date of Birth: 09/12/1975 Referring Provider (PT): Mary Stanbery, PA-C   Encounter Date: 08/17/2019    Past Medical History:  Diagnosis Date  . Hypertension   . Migraine     Past Surgical History:  Procedure Laterality Date  . CHOLECYSTECTOMY N/A 11/10/2017   Procedure: LAPAROSCOPIC CHOLECYSTECTOMY WITH INTRAOPERATIVE CHOLANGIOGRAM;  Surgeon: Martin, Matthew, MD;  Location: [facility];  Service: General;  Laterality: N/A;    There were no vitals filed for this visit.                                 PT Long Term Goals - 08/11/19 1506      PT LONG TERM GOAL #1   Title  Independent with HEP    Baseline  met for previous HEP, will continue to update prn    Time  6    Period  Weeks    Status  Achieved      PT LONG TERM GOAL #2   Title  Increase left ankle dorsiflexion AROM to at least past neutral to improve toe clearance with gait    Baseline  0 deg/neutral    Time  6    Period  Weeks    Status  On-going    Target Date  09/22/19      PT LONG TERM GOAL #3   Title  Improve FOTO outcome measure score to 41% or less impairment    Baseline  48% limited (74% at eval)    Time  6    Period  Weeks    Status  On-going    Target Date  09/22/19      PT LONG TERM GOAL #4   Title  Left ankle strength grossly 5/5 to improve ankle stability for outdoor ambulation over uneven surfaces    Baseline  see objective, goal ongoing    Time  6    Status  On-going    Target Date  09/22/19      PT LONG TERM GOAL #5   Title  Tolerate standing/ambulation periods at least 30-40 minutes with left ankle pain 3/10 or less to improve tolerance for chores and ability for work duties, community mobility    Baseline  improving  but pain > 3/10 with prolonged ambulation and standing    Time  6    Period  Weeks    Status  On-going    Target Date  09/22/19              Patient will benefit from skilled therapeutic intervention in order to improve the following deficits and impairments:  Pain, Impaired flexibility, Decreased strength, Decreased activity tolerance, Decreased range of motion, Increased edema, Difficulty walking, Decreased balance  Visit Diagnosis: Pain in left ankle and joints of left foot  Stiffness of left ankle, not elsewhere classified  Difficulty in walking, not elsewhere classified  Localized edema     Problem List Patient Active Problem List   Diagnosis Date Noted  . Migraine headache 04/08/2018  . Hypertension 04/08/2018  . SIRS (systemic inflammatory response syndrome) ([facility]CC) 11/08/2017  . Calculus of gallbladder without cholecystitis without obstruction 11/05/2017  . Fatty liver 11/05/2017  . Nausea and vomiting 10/28/2017  . Lower abdominal pain 10/28/2017  .   Transaminitis 10/28/2017  . Hyperbilirubinemia 10/28/2017  . Leukocytosis 10/28/2017      PHYSICAL THERAPY DISCHARGE SUMMARY  Visits from Start of Care: 1  Current functional level related to goals / functional outcomes: Patient did not return for further formal therapy after last session 08/17/19 with last visits cancelled due to schedule conflicts. She is continuing via HEP and will continue follow up with MD as scheduled regarding status.   Remaining deficits: Ankle weakness and stiffness   Education / Equipment: HEP Plan: Patient agrees to discharge.  Patient goals were partially met. Patient is being discharged due to not returning since the last visit.  ?????          Christopher Zoch, PT, DPT 09/28/19 2:26 PM      [facility] [address] [place], [place], [address] Phone: [phone]   Fax:  [phone]  Name: Sharon D  Johnson MRN: [id number] Date of Birth: 05/10/1976   

## 2019-10-02 MED FILL — CYCLOBENZAPRINE 5 MG TABLET: 5 | 15 days supply | Qty: 30 | Fill #1

## 2019-10-02 MED FILL — AMLODIPINE BESYLATE 5 MG TA: 5 | 30 days supply | Qty: 30 | Fill #5

## 2019-10-02 MED FILL — ?IBUPROFEN 600 MG TABLETS: 600 | 30 days supply | Qty: 60 | Fill #2

## 2019-10-05 ENCOUNTER — Telehealth: Payer: Self-pay | Admitting: Orthopaedic Surgery

## 2019-10-05 NOTE — Telephone Encounter (Signed)
Received call from [facility] w/ Janeann Forehand, needing 2/26 ov note & billing. I advised was mailed per their request 3/4. They havenot received yet. I faxed [phone]

## 2019-10-18 ENCOUNTER — Institutional Professional Consult (permissible substitution): Payer: Self-pay | Admitting: Licensed Clinical Social Worker

## 2019-11-08 MED FILL — AMLODIPINE BESYLATE 5 MG TA: 5 | 30 days supply | Qty: 30 | Fill #0

## 2019-11-17 ENCOUNTER — Other Ambulatory Visit: Payer: Self-pay

## 2019-11-17 ENCOUNTER — Ambulatory Visit (INDEPENDENT_AMBULATORY_CARE_PROVIDER_SITE_OTHER): Payer: Worker's Compensation | Admitting: Orthopaedic Surgery

## 2019-11-17 ENCOUNTER — Encounter: Payer: Self-pay | Admitting: Orthopaedic Surgery

## 2019-11-17 ENCOUNTER — Ambulatory Visit (INDEPENDENT_AMBULATORY_CARE_PROVIDER_SITE_OTHER): Payer: Worker's Compensation

## 2019-11-17 VITALS — Ht 62.0 in | Wt 186.0 lb

## 2019-11-17 DIAGNOSIS — S82832G Other fracture of upper and lower end of left fibula, subsequent encounter for closed fracture with delayed healing: Secondary | ICD-10-CM | POA: Diagnosis not present

## 2019-11-17 NOTE — Progress Notes (Signed)
   Office Visit Note   Patient: Sharon Johnson           Date of Birth: 1975-07-26           MRN: [id number] Visit Date: 11/17/2019              Requested by: Cain Saupe, MD [address] [place],  [place] [address] PCP: Cain Saupe, MD   Assessment & Plan: Visit Diagnoses:  1. Closed fracture of distal end of left fibula with delayed healing, unspecified fracture morphology, subsequent encounter     Plan: Impression is healed left distal fibula fracture following delayed union.  She is asymptomatic and is demonstrating clinical healing.  Patient can now discontinue her bone stimulator.  She will advance with activity as tolerated and follow-up with [place] as needed.  Follow-Up Instructions: Return if symptoms worsen or fail to improve.   Orders:  Orders Placed This Encounter  Procedures  . XR Ankle Complete Left   No orders of the defined types were placed in this encounter.     Procedures: No procedures performed   Clinical Data: No additional findings.   Subjective: Chief Complaint  Patient presents with  . Left Ankle - Follow-up    HPI patient is a pleasant 44 year old Spanish-speaking female who comes in today with an interpreter.  She is approximately 7-1/2 months out delayed union left distal fibula.  She has been using her bone stimulator few hours a day few days a week.  She has minimal to no pain.  Overall, doing very well.     Objective: Vital Signs: Ht 5\' 2"  (1.575 m)   Wt 186 lb (84.4 kg)   BMI 34.02 kg/m     Ortho Exam examination of the left ankle reveals no swelling.  Minimal tenderness to the distal fibula.  Near full range of motion.  She is neurovascular intact distally.  Specialty Comments:  No specialty comments available.  Imaging: XR Ankle Complete Left  Result Date: 11/17/2019 Healed distal fibula fracture    PMFS History: Patient Active Problem List   Diagnosis Date Noted  . Migraine headache 04/08/2018  .  Hypertension 04/08/2018  . SIRS (systemic inflammatory response syndrome) (HCC) 11/08/2017  . Calculus of gallbladder without cholecystitis without obstruction 11/05/2017  . Fatty liver 11/05/2017  . Nausea and vomiting 10/28/2017  . Lower abdominal pain 10/28/2017  . Transaminitis 10/28/2017  . Hyperbilirubinemia 10/28/2017  . Leukocytosis 10/28/2017   Past Medical History:  Diagnosis Date  . Hypertension   . Migraine     Family History  Problem Relation Age of Onset  . Hypertension Mother   . Breast cancer Sister     Past Surgical History:  Procedure Laterality Date  . CHOLECYSTECTOMY N/A 11/10/2017   Procedure: LAPAROSCOPIC CHOLECYSTECTOMY WITH INTRAOPERATIVE CHOLANGIOGRAM;  Surgeon: 11/12/2017, MD;  Location: [facility];  Service: General;  Laterality: N/A;   Social History   Occupational History  . Not on file  Tobacco Use  . Smoking status: Never Smoker  . Smokeless tobacco: Never Used  Substance and Sexual Activity  . Alcohol use: No    Alcohol/week: 0.0 standard drinks  . Drug use: No  . Sexual activity: Not Currently    Birth control/protection: I.U.D.

## 2019-12-01 ENCOUNTER — Other Ambulatory Visit: Payer: Self-pay | Admitting: Obstetrics and Gynecology

## 2019-12-01 DIAGNOSIS — Z1231 Encounter for screening mammogram for malignant neoplasm of breast: Secondary | ICD-10-CM

## 2019-12-04 ENCOUNTER — Other Ambulatory Visit: Payer: Self-pay | Admitting: Obstetrics and Gynecology

## 2019-12-04 DIAGNOSIS — Z1231 Encounter for screening mammogram for malignant neoplasm of breast: Secondary | ICD-10-CM

## 2019-12-22 MED FILL — AMLODIPINE BESYLATE 5 MG TA: 5 | 30 days supply | Qty: 30 | Fill #1

## 2019-12-22 MED FILL — IBUPROFEN 600 MG TABLET: 600 | 30 days supply | Qty: 60 | Fill #3

## 2019-12-28 ENCOUNTER — Other Ambulatory Visit: Payer: Self-pay

## 2019-12-28 ENCOUNTER — Ambulatory Visit
Admission: RE | Admit: 2019-12-28 | Discharge: 2019-12-28 | Disposition: A | Discharge status: Home / Self Care | Payer: No Typology Code available for payment source | Source: Ambulatory Visit | Attending: Obstetrics and Gynecology | Admitting: Obstetrics and Gynecology

## 2019-12-28 ENCOUNTER — Ambulatory Visit: Payer: Self-pay | Admitting: *Deleted

## 2019-12-28 VITALS — BP 120/74 | Temp 97.7°F | Wt 181.7 lb

## 2019-12-28 DIAGNOSIS — Z1231 Encounter for screening mammogram for malignant neoplasm of breast: Secondary | ICD-10-CM

## 2019-12-28 DIAGNOSIS — Z1239 Encounter for other screening for malignant neoplasm of breast: Secondary | ICD-10-CM

## 2019-12-28 NOTE — Progress Notes (Signed)
Ms. Sharon Johnson is a 44 y.o. female who presents to [facility] clinic today with complaints of her right axillary being swollen x 2 years. Patient complained of pain that starts in her upper back that radiates to her axilla x 2 years. Patient rated the pain at a 4 out of 10.   Pap Smear: Pap not smear completed today. Last Pap smear was 11/28/2019 at the [facility] and was normal per patient. Per patient has no history of an abnormal Pap smear. No Pap smear results are in Epic.   Physical exam: Breasts Breasts symmetrical. No skin abnormalities bilateral breasts. No nipple retraction bilateral breasts. No nipple discharge bilateral breasts. No lymphadenopathy. No lumps palpated bilateral breasts. No complaints of pain or tenderness on exam.       Pelvic/Bimanual Pap is not indicated today per BCCCP guidelines.   Smoking History: Patient has never smoked.   Patient Navigation: Patient education provided. Access to services provided for patient through [facility] program. Spanish interpreter Natale Lay from [facility] provided.   Breast and Cervical Cancer Risk Assessment: Patient has no family history of breast cancer, known genetic mutations, or radiation treatment to the chest before age 27. Patient has no history of cervical dysplasia, immunocompromised, or DES exposure in-utero.  Risk Assessment    Risk Scores      12/28/2019   Last edited by: Narda Rutherford, LPN   5-year risk: 0.3 %   Lifetime risk: 4.6 %         A: BCCCP exam without pap smear  P: Referred patient to the [facility] for a screening mammogram. Appointment scheduled Thursday, December 28, 2019 at 1430 on the mobile unit.  Priscille Heidelberg, RN 12/28/2019 2:13 PM

## 2019-12-28 NOTE — Patient Instructions (Signed)
Explained breast self awareness with Michaelle Birks. Patient did not need a Pap smear today due to last Pap smear was 11/28/2019 per patient. Let her know BCCCP will cover Pap smears every 3 years unless has a history of abnormal Pap smears. Referred patient to the [facility] for a screening mammogram. Appointment scheduled Thursday, December 28, 2019 at 1430 on the mobile unit. Max Sane Granados-Benitez verbalized understanding.  Gershom Brobeck, Kathaleen Maser, RN 2:13 PM

## 2020-01-03 ENCOUNTER — Other Ambulatory Visit: Payer: Self-pay | Admitting: Obstetrics and Gynecology

## 2020-01-03 DIAGNOSIS — R928 Other abnormal and inconclusive findings on diagnostic imaging of breast: Secondary | ICD-10-CM

## 2020-01-16 ENCOUNTER — Ambulatory Visit
Admission: RE | Admit: 2020-01-16 | Discharge: 2020-01-16 | Disposition: A | Discharge status: Home / Self Care | Payer: No Typology Code available for payment source | Source: Ambulatory Visit | Attending: Obstetrics and Gynecology | Admitting: Obstetrics and Gynecology

## 2020-01-16 ENCOUNTER — Other Ambulatory Visit: Payer: Self-pay

## 2020-01-16 DIAGNOSIS — R928 Other abnormal and inconclusive findings on diagnostic imaging of breast: Secondary | ICD-10-CM

## 2020-01-31 MED FILL — IBUPROFEN 600 MG TABLET: 600 | 20 days supply | Qty: 60 | Fill #1

## 2020-02-01 MED FILL — ?AMLODIPINE BESYLATE 5MG TA: 5 | 30 days supply | Qty: 30 | Fill #2

## 2020-03-13 MED FILL — AMLODIPINE BESYLATE 5 MG TA: 5 | 30 days supply | Qty: 30 | Fill #3

## 2020-03-13 MED FILL — ?IBUPROFEN 600 MG TABLETS: 600 | 20 days supply | Qty: 60 | Fill #2

## 2020-04-24 MED FILL — ?IBUPROFEN 600 MG TABLETS: 600 | 20 days supply | Qty: 60 | Fill #3

## 2020-04-25 MED FILL — ?AMLODIPINE BESYLATE 5MG TA: 5 | 30 days supply | Qty: 30 | Fill #4

## 2020-05-03 ENCOUNTER — Ambulatory Visit: Payer: Self-pay

## 2020-05-03 NOTE — Telephone Encounter (Signed)
Using Spanish interpreter # [phone], pt. Reports she has had neck pain x 2-3 weeks. No injury. Back of neck hurts and radiates into shoulders.Interupts her sleep. Motrin does not help.Pt. has an appointment 05/13/20, but states she needs to be seen today. Will go to [facility].  Answer Assessment - Initial Assessment Questions 1. ONSET: "When did the pain begin?"      2-3 weeks 2. LOCATION: "Where does it hurt?"      Neck and shoulder 3. PATTERN "Does the pain come and go, or has it been constant since it started?"      Constant 4. SEVERITY: "How bad is the pain?"  (Scale 1-10; or mild, moderate, severe)   - NO PAIN (0): no pain or only slight stiffness    - MILD (1-3): doesn't interfere with normal activities    - MODERATE (4-7): interferes with normal activities or awakens from sleep    - SEVERE (8-10):  excruciating pain, unable to do any normal activities      8 5. RADIATION: "Does the pain go anywhere else, shoot into your arms?"     Shoulder 6. CORD SYMPTOMS: "Any weakness or numbness of the arms or legs?"     Some arm pain 7. CAUSE: "What do you think is causing the neck pain?"     Unsure 8. NECK OVERUSE: "Any recent activities that involved turning or twisting the neck?"No      9. OTHER SYMPTOMS: "Do you have any other symptoms?" (e.g., headache, fever, chest pain, difficulty breathing, neck swelling)     Headache 10. PREGNANCY: "Is there any chance you are pregnant?" "When was your last menstrual period?"       No  Protocols used: NECK PAIN OR STIFFNESS-A-AH

## 2020-05-13 ENCOUNTER — Ambulatory Visit: Payer: No Typology Code available for payment source | Admitting: Family Medicine

## 2020-05-30 ENCOUNTER — Telehealth: Payer: Self-pay | Admitting: Family Medicine

## 2020-05-30 NOTE — Telephone Encounter (Signed)
Patient is calling back after she missed a call. Please advise CB- [phone]

## 2020-05-30 NOTE — Telephone Encounter (Signed)
Called pt left VM, to call back confirming Virtual appt .

## 2020-05-30 NOTE — Telephone Encounter (Signed)
Please advise   Copied from CRM [id number]. Topic: Appointment Scheduling - Prior Auth Required for Appointment >> May 30, 2020  8:53 AM Louie Bun, Rosey Bath D wrote:  Patient called and would like to come into the office instead of having a virtual visit for her appt tomorrow. If someone can please call her back if this is possible.Per scheduling protocol, this appointment requires a prior authorization prior to scheduling.  Route to department's PEC pool.

## 2020-05-31 ENCOUNTER — Telehealth: Payer: Self-pay

## 2020-05-31 ENCOUNTER — Other Ambulatory Visit: Payer: Self-pay

## 2020-05-31 ENCOUNTER — Ambulatory Visit: Payer: Self-pay | Attending: Family Medicine | Admitting: Family Medicine

## 2020-05-31 DIAGNOSIS — Z5329 Procedure and treatment not carried out because of patient's decision for other reasons: Secondary | ICD-10-CM

## 2020-05-31 NOTE — Telephone Encounter (Signed)
Copied from CRM [id number]. Topic: Appointment Scheduling - Prior Auth Required for Appointment >> May 30, 2020  8:53 AM Louie Bun, Rosey Bath D wrote:  Patient called and would like to come into the office instead of having a virtual visit for her appt tomorrow. If someone can please call her back if this is possible.Per scheduling protocol, this appointment requires a prior authorization prior to scheduling.  Route to department's PEC pool. >> May 30, 2020  4:29 PM Opal Sidles wrote: Called pt to inquire the reason. No answer. Please advise   Att to contact pt to verify reason for needing in person.No Ans.then goes busy.Visit remains virtual if pt callbacks

## 2020-05-31 NOTE — Telephone Encounter (Signed)
Copied from CRM [id number]. Topic: Appointment Scheduling - Prior Auth Required for Appointment >> May 30, 2020  8:53 AM Louie Bun, Rosey Bath D wrote:  Patient called and would like to come into the office instead of having a virtual visit for her appt tomorrow. If someone can please call her back if this is possible.Per scheduling protocol, this appointment requires a prior authorization prior to scheduling.  Route to department's PEC pool. >> May 30, 2020  4:29 PM Opal Sidles wrote: Called pt to inquire the reason. No answer. Please advise

## 2020-05-31 NOTE — Telephone Encounter (Signed)
Have tried to contact pt multiple times in regard to virtual appt//call goes directly to vm msg left by [facility] w/[employer]

## 2020-05-31 NOTE — Progress Notes (Signed)
Patient ID: Sharon Johnson, female   DOB: Dec 10, 1975, 44 y.o.   MRN: [id number]   CMA was unable to contact patient with the assistance of an interpreter in order to start today's telemedicine visit

## 2020-06-03 ENCOUNTER — Telehealth: Payer: Self-pay | Admitting: Family Medicine

## 2020-06-03 NOTE — Telephone Encounter (Signed)
Tried to contact patient to reschedule her appt. The call went straight to voice mail.    Copied from CRM [id number]. Topic: General - Other >> May 31, 2020  5:03 PM Herby Abraham C wrote: Reason for CRM: pt had a virtual visit scheduled with PCP for today. Pt says that she waited by the phone but never received a call. Pt says that she need a refill on her medications below. Pt says that she isn't sure what happened. Pt says that she will call back to reschedule apt.   cyclobenzaprine (FLEXERIL) 5 MG tablet amLODipine (NORVASC) 5 MG tablet ibuprofen (ADVIL) 600 MG tablet   Pharmacy: [facility] - [place], [place] - [place] [address]  Phone:  [phone] Fax:  [phone]      Phone: [phone]

## 2020-06-12 MED FILL — AMLODIPINE BESYLATE 5 MG TA: 5 | 30 days supply | Qty: 30 | Fill #5

## 2020-08-16 ENCOUNTER — Other Ambulatory Visit: Payer: Self-pay | Admitting: Family Medicine

## 2020-08-16 DIAGNOSIS — M62838 Other muscle spasm: Secondary | ICD-10-CM

## 2020-08-16 DIAGNOSIS — G4700 Insomnia, unspecified: Secondary | ICD-10-CM

## 2020-08-16 DIAGNOSIS — E785 Hyperlipidemia, unspecified: Secondary | ICD-10-CM

## 2020-08-16 DIAGNOSIS — I10 Essential (primary) hypertension: Secondary | ICD-10-CM

## 2020-08-16 DIAGNOSIS — M791 Myalgia, unspecified site: Secondary | ICD-10-CM

## 2020-08-16 NOTE — Telephone Encounter (Signed)
Medication: ibuprofen (ADVIL) 600 MG tablet [[id number]] , cyclobenzaprine (FLEXERIL) 5 MG tablet [[id number]] , amLODipine (NORVASC) 5 MG tablet [[id number]] , atorvastatin (LIPITOR) 20 MG tablet [[id number]]   Patient is out of all medications.  Has the patient contacted their pharmacy? YES  (Agent: If no, request that the patient contact the pharmacy for the refill.) (Agent: If yes, when and what did the pharmacy advise?)  Preferred Pharmacy (with phone number or street name): [facility] - [place], [place] - [place] [address]  [address], [place] [address]  Phone:  [phone] Fax:  [phone]   Agent: Please be advised that RX refills may take up to 3 business days. We ask that you follow-up with your pharmacy.

## 2020-08-17 NOTE — Telephone Encounter (Signed)
Requested medication (s) are due for refill today: yes  Requested medication (s) are on the active medication list: yes  Last refill:  ibuprofen: 06/22/19  amlodipine: 06/22/19     Ibuprofen: 06/22/19     Flexaril: 08/23/19  Future visit scheduled: no sent msg to pt on MyChart  Notes to clinic:  overdue for lab work, and OV, flexaril not delegated to NT to RF   Requested Prescriptions  Pending Prescriptions Disp Refills   ibuprofen (ADVIL) 600 MG tablet 60 tablet 3    Sig: Take 1 tablet (600 mg total) by mouth every 8 (eight) hours as needed. For pain; take after eating      Analgesics:  NSAIDS Failed - 08/16/2020  6:17 PM      Failed - Cr in normal range and within 360 days    Creatinine, Ser  Date Value Ref Range Status  06/22/2019 0.50 (L) 0.57 - 1.00 mg/dL Final          Failed - HGB in normal range and within 360 days    Hemoglobin  Date Value Ref Range Status  11/13/2017 12.0 12.0 - 15.0 g/dL Final  10/62/6948 54.6 11.1 - 15.9 g/dL Final          Passed - Patient is not pregnant      Passed - Valid encounter within last 12 months    Recent Outpatient Visits           2 months ago No-show for appointment   [facility] [place], [place], [place]   12 months ago Muscle spasm   [facility] [place], [place], [place]   1 year ago Closed fracture of distal end of left fibula, unspecified fracture morphology, initial encounter   [facility] [place], [place], [place]   1 year ago Mild peripheral edema   [facility] [place], [place], [place]   2 years ago Viral upper respiratory illness   [facility] [place], Gavin Pound B, MD                  amLODipine (NORVASC) 5 MG tablet 90 tablet 1    Sig: Take 1 tablet (5 mg total) by mouth daily.      Cardiovascular:  Calcium Channel Blockers Failed - 08/16/2020  6:17 PM      Failed - Valid encounter  within last 6 months    Recent Outpatient Visits           2 months ago No-show for appointment   [facility] [place], [place], [place]   12 months ago Muscle spasm   [facility] [place], [place], [place]   1 year ago Closed fracture of distal end of left fibula, unspecified fracture morphology, initial encounter   [facility] [place], [place], [place]   1 year ago Mild peripheral edema   [facility] [place], [place], [place]   2 years ago Viral upper respiratory illness   [facility] Marcine Matar, MD                Passed - Last BP in normal range    BP Readings from Last 1 Encounters:  12/28/19 120/74            atorvastatin (LIPITOR) 20 MG tablet 30 tablet 6  Sig: Take 1 tablet (20 mg total) by mouth daily.      Cardiovascular:  Antilipid - Statins Failed - 08/16/2020  6:17 PM      Failed - Total Cholesterol in normal range and within 360 days    No results found for: CHOL, POCCHOL, CHOLTOT        Failed - LDL in normal range and within 360 days    No results found for: LDLCALC, LDLC, HIRISKLDL, POCLDL, LDLDIRECT, REALLDLC, TOTLDLC        Failed - HDL in normal range and within 360 days    No results found for: HDL, POCHDL        Failed - Triglycerides in normal range and within 360 days    No results found for: TRIG, POCTRIG        Passed - Patient is not pregnant      Passed - Valid encounter within last 12 months    Recent Outpatient Visits           2 months ago No-show for appointment   [facility] [place], [place], MD   12 months ago Muscle spasm   [facility] [place], [place], [place]   1 year ago Closed fracture of distal end of left fibula, unspecified fracture morphology, initial encounter   [facility] [place], [place], [place]    1 year ago Mild peripheral edema   [facility] [place], [place], [place]   2 years ago Viral upper respiratory illness   [facility] [place], [place]innie [place], MD                  cyclobenzaprine (FLEXERIL) 5 MG tablet 30 tablet 1    Sig: 1-2 at bedtime prn muscle spasm      Not Delegated - Analgesics:  Muscle Relaxants Failed - 08/16/2020  6:17 PM      Failed - This refill cannot be delegated      Failed - Valid encounter within last 6 months    Recent Outpatient Visits           2 months ago No-show for appointment   [facility] [place], [place], MD   12 months ago Muscle spasm   [facility] [place], [place], [place]   1 year ago Closed fracture of distal end of left fibula, unspecified fracture morphology, initial encounter   [facility] [place], [place], [place]   1 year ago Mild peripheral edema   [facility] [place], [place], [place]   2 years ago Viral upper respiratory illness   [facility] Marcine Matar, MD

## 2020-09-02 ENCOUNTER — Ambulatory Visit: Payer: Self-pay | Admitting: *Deleted

## 2020-09-02 NOTE — Telephone Encounter (Signed)
Pt called in c/o having a headache daily but since Friday it has become worse.   It hurts a lot in the back of her neck.  She stayed home from work today because it's hurting.   She is also c/o her hands being numb on and off.  See notes below.  There is a Spanish interpreter being used [phone] with [employer].  She gets migraines but this feels different than usual.   She is using ibuprofen which helps but the pain comes back when the medicine wears off.  The soonest appt [facility] has is with Bertram Denver on 10/03/2020 which she has that appt. She is requesting to be put on a waiting list in case someone cancels if the office does that.   I let her know I would send a note with her request.  I also went over the care advice especially to try heat and cold and see which works better for her on the back of her neck.   "I have not tried that".  I asked if she checks her BP.   "I bought a machine but it broke and I don't have another one yet".   She takes medication for hypertension.  I sent my notes to [facility]. To see if they can put her on a wait list if they do that.  Reason for Disposition . [1] MODERATE headache (e.g., interferes with normal activities) AND [2] present > 24 hours AND [3] unexplained  (Exceptions: analgesics not tried, typical migraine, or headache part of viral illness)  Answer Assessment - Initial Assessment Questions 1. LOCATION: "Where does it hurt?"      I have a headache every day.   Friday it got worse in the back of my neck.   I stayed out of work today because the pain is unbearable.   My hands are also numb.2. ONSET: "When did the headache start?" (Minutes, hours or days)      Friday it became worse the headache. I take ibuprofen it helps some but the pain returns.  I'm having pains in both eyes but the left eye is worse.  No blurry vision.  Spanish interpreter [phone] for phone call. 3. PATTERN: "Does the pain come and go,  or has it been constant since it started?"     I'm having a lot of discomfort.   I have migraines.   With migraines I can't stand the lights on and can't wear my hair in a ponytail.  So I don't think this is a migraine.   I usually use 600 mg ibuprofen.   I took a migraine pill and it helped for 304 days but then the headache comes back.   4. SEVERITY: "How bad is the pain?" and "What does it keep you from doing?"  (e.g., Scale 1-10; mild, moderate, or severe)   - MILD (1-3): doesn't interfere with normal activities    - MODERATE (4-7): interferes with normal activities or awakens from sleep    - SEVERE (8-10): excruciating pain, unable to do any normal activities        9 on scale 5. RECURRENT SYMPTOM: "Have you ever had headaches before?" If Yes, ask: "When was the last time?" and "What happened that time?"      Yes.   All the time but worse since Friday. 6. CAUSE: "What do you think is causing the headache?"     I don't know 7. MIGRAINE: "Have you been  diagnosed with migraine headaches?" If Yes, ask: "Is this headache similar?"      Yes but this feels different.   I still have the pain with the medication.  In the back of my neck it hurts and I feel a crack when I move it. No accidents or injuries. 8. HEAD INJURY: "Has there been any recent injury to the head?"      None 9. OTHER SYMPTOMS: "Do you have any other symptoms?" (fever, stiff neck, eye pain, sore throat, cold symptoms)     I get a lot of sweat from my waist up.   I get it worse with the headache at times.    10. PREGNANCY: "Is there any chance you are pregnant?" "When was your last menstrual period?"       No  Protocols used: HEADACHE-A-AH

## 2020-09-06 ENCOUNTER — Telehealth: Payer: Self-pay

## 2020-09-06 NOTE — Telephone Encounter (Signed)
Called patient and LVM using interpreter services advising her that her appointment for March 17 had been cancelled due to provider being out of office. Advised patient to call [phone] to reschedule.

## 2020-10-03 ENCOUNTER — Ambulatory Visit: Payer: No Typology Code available for payment source | Admitting: Nurse Practitioner

## 2020-12-03 ENCOUNTER — Ambulatory Visit: Payer: Self-pay | Attending: Nurse Practitioner | Admitting: Nurse Practitioner

## 2020-12-03 ENCOUNTER — Encounter: Payer: Self-pay | Admitting: Nurse Practitioner

## 2020-12-03 ENCOUNTER — Other Ambulatory Visit: Payer: Self-pay

## 2020-12-03 VITALS — BP 136/91 | HR 66 | Ht 62.0 in | Wt 176.6 lb

## 2020-12-03 DIAGNOSIS — I10 Essential (primary) hypertension: Secondary | ICD-10-CM

## 2020-12-03 DIAGNOSIS — Z7689 Persons encountering health services in other specified circumstances: Secondary | ICD-10-CM

## 2020-12-03 DIAGNOSIS — E669 Obesity, unspecified: Secondary | ICD-10-CM

## 2020-12-03 DIAGNOSIS — M791 Myalgia, unspecified site: Secondary | ICD-10-CM

## 2020-12-03 DIAGNOSIS — E785 Hyperlipidemia, unspecified: Secondary | ICD-10-CM

## 2020-12-03 DIAGNOSIS — M542 Cervicalgia: Secondary | ICD-10-CM

## 2020-12-03 IMAGING — US US BREAST*L* LIMITED INC AXILLA
1 series · 10 of 10 positions shown · non-contrast
Comparison: Baseline screening mammogram dated 12/28/2019.

CLINICAL DATA: Screening recall for possible left breast asymmetry.

EXAM:
DIGITAL DIAGNOSTIC UNILATERAL LEFT MAMMOGRAM WITH TOMO AND CAD;
ULTRASOUND LEFT BREAST LIMITED
LEFT BREAST ULTRASOUND

[Series 1: us breast*left* limited inc axilla · 0.06mm/px · 10 of 10 slices shown]
[im 1/10]
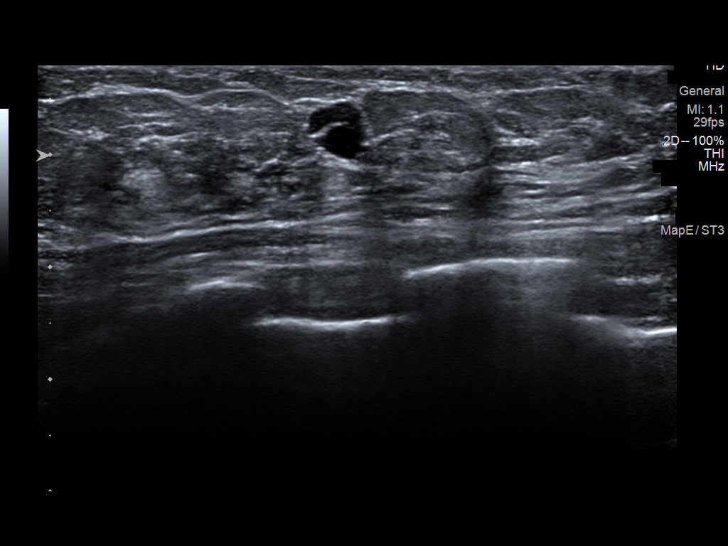
[im 2/10]
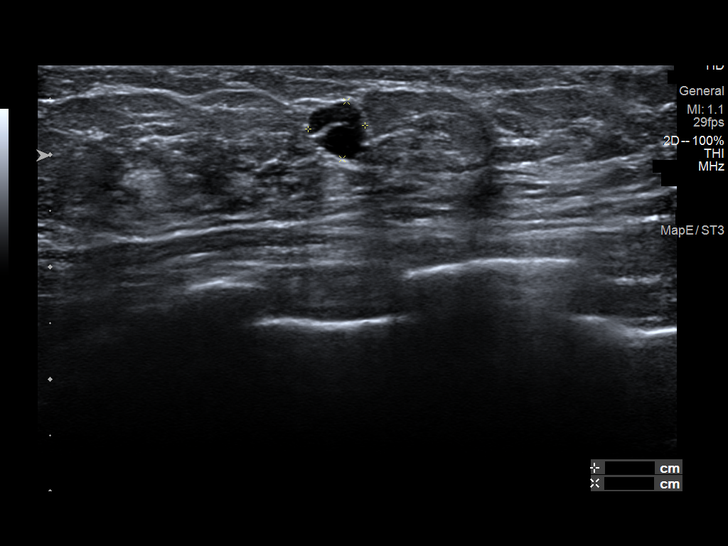
[im 3/10]
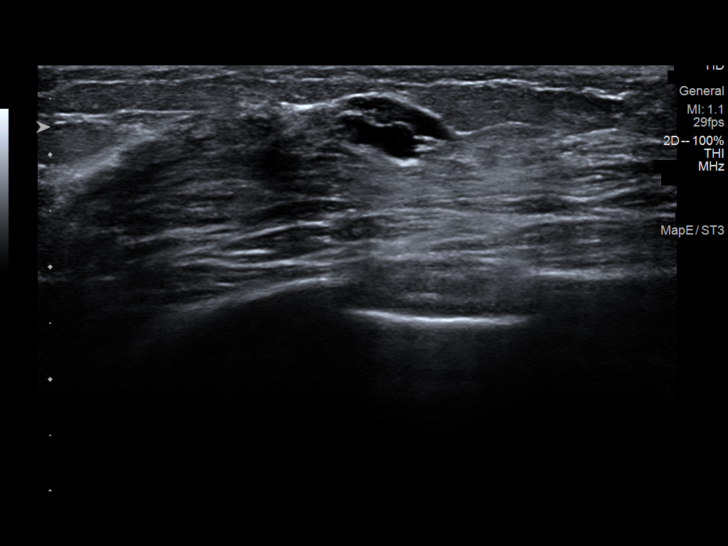
[im 4/10]
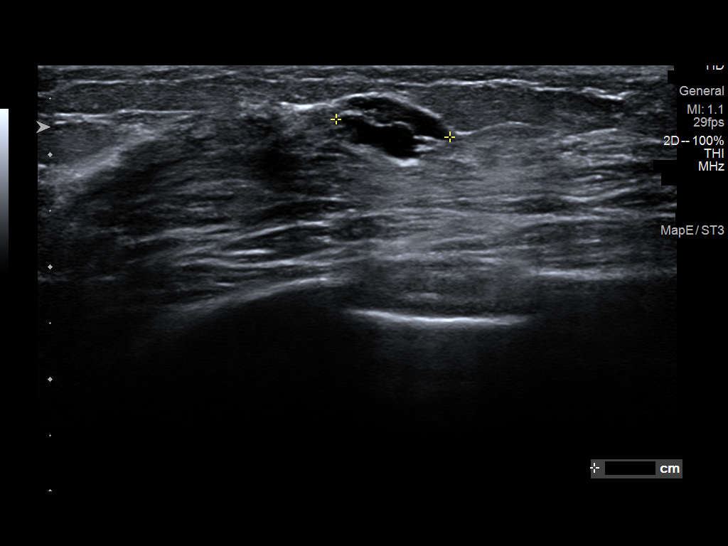
[im 5/10]
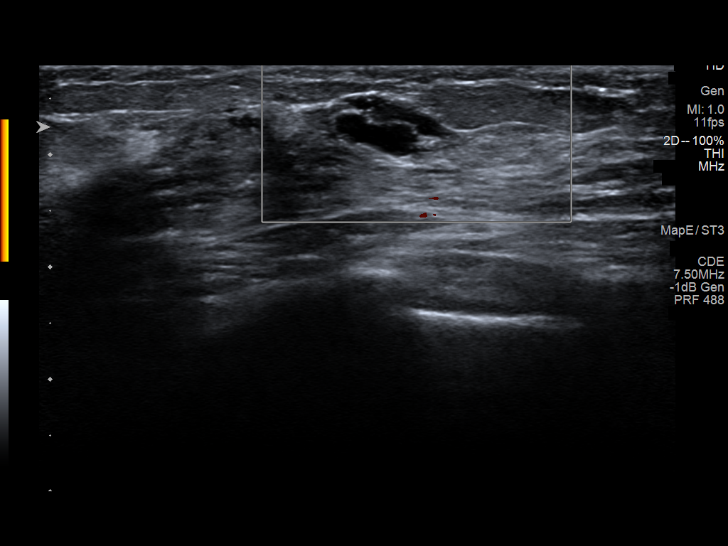
[im 6/10]
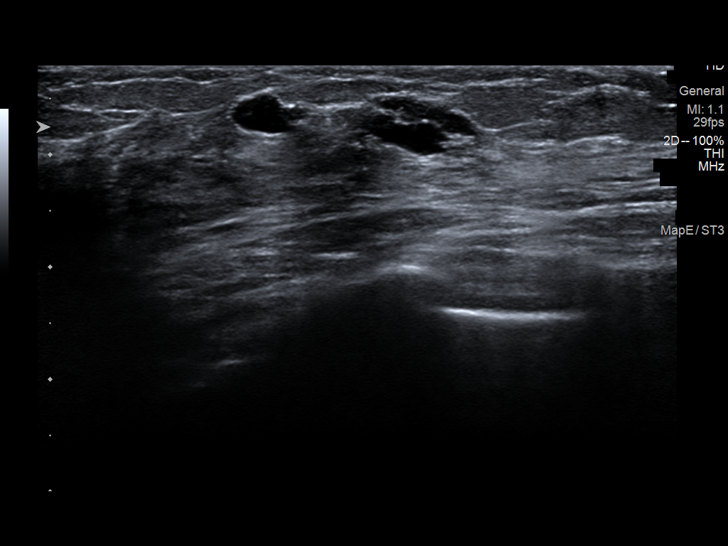
[im 7/10]
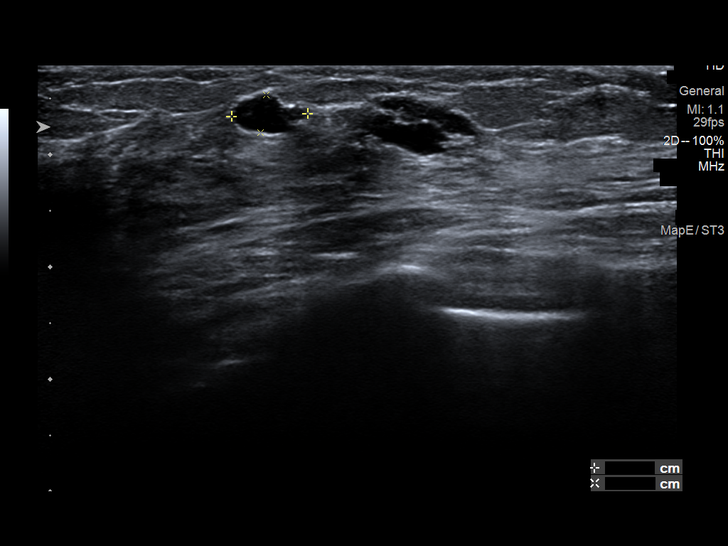
[im 8/10]
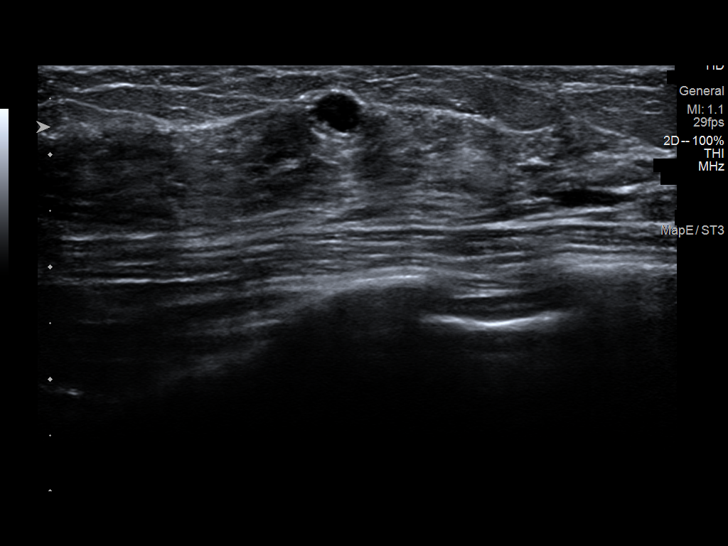
[im 9/10]
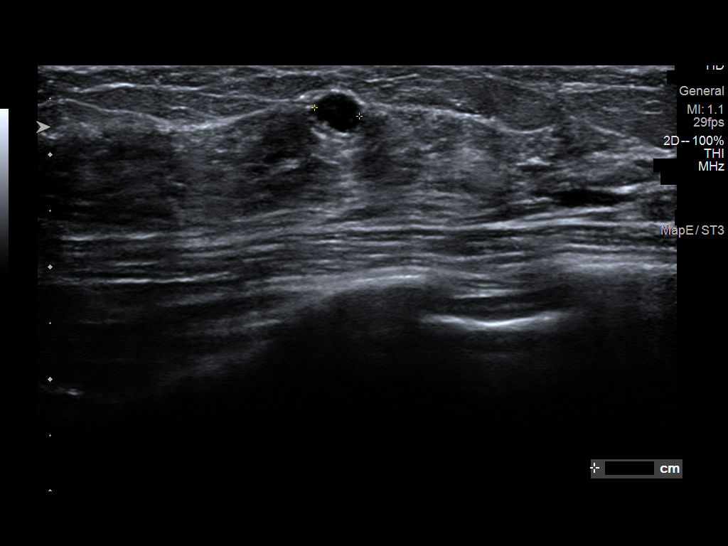
[im 10/10]
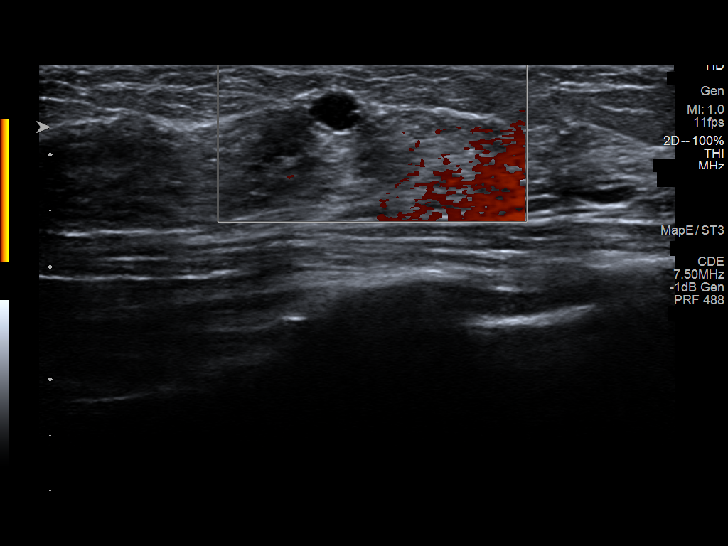

[10 of 10 positions shown; findings below may reference images not displayed]

ACR Breast Density Category d: The breast tissue is extremely dense,
which lowers the sensitivity of mammography.
FINDINGS: Additional tomograms were performed of the outer left breast. There
is an oval mass in the outer left breast best visualized on the full
paddle cc tomograms measuring approximately 0.8 cm.

Mammographic images were processed with CAD.

Targeted ultrasound of the outer left breast was performed
demonstrating several areas of fibrocystic change. A cyst at 3
o'clock 5 cm from nipple measures 0.5 x 0.5 x 1 cm. An additional
adjacent cyst also at 3 o'clock 5 cm from nipple measures 0.7 x
x 0.4 cm. One of these cysts is felt to correspond well with
mammography findings. No suspicious masses or abnormality seen in
the outer left breast sonographically.
IMPRESSION: No findings of malignancy in the left breast.

RECOMMENDATION:
Screening mammogram in one year.(Code:U5-A-9DA)

I have discussed the findings and recommendations with the patient.
If applicable, a reminder letter will be sent to the patient
regarding the next appointment.

BI-RADS CATEGORY  2: Benign.

## 2020-12-03 MED ORDER — IBUPROFEN 800 MG PO TABS
800.0000 mg | ORAL_TABLET | Freq: Three times a day (TID) | ORAL | 1 refills | Status: DC | PRN
  Filled 2020-12-03: qty 90, 30d supply, fill #0
  Filled 2021-05-02: qty 90, 30d supply, fill #1

## 2020-12-03 MED ORDER — CYCLOBENZAPRINE HCL 5 MG PO TABS
ORAL_TABLET | ORAL | 3 refills | Status: DC
  Filled 2020-12-03: qty 60, 30d supply, fill #0

## 2020-12-03 MED ORDER — AMLODIPINE BESYLATE 5 MG PO TABS
5.0000 mg | ORAL_TABLET | Freq: Every day | ORAL | 1 refills | Status: DC
  Filled 2020-12-03: qty 30, 30d supply, fill #0
  Filled 2021-05-02: qty 30, 30d supply, fill #1

## 2020-12-03 MED ORDER — ATORVASTATIN CALCIUM 20 MG PO TABS
20.0000 mg | ORAL_TABLET | Freq: Every day | ORAL | 3 refills | Status: DC
  Filled 2020-12-03: qty 30, 30d supply, fill #0
  Filled 2021-05-02: qty 30, 30d supply, fill #1

## 2020-12-03 NOTE — Progress Notes (Signed)
Assessment & Plan:  Sharon Johnson was seen today for hypertension.  Diagnoses and all orders for this visit:  Encounter to establish care  Neck pain -     DG Cervical Spine Complete; Future -     CBC -     cyclobenzaprine (FLEXERIL) 5 MG tablet; 1-2 at bedtime prn muscle spasm -     ibuprofen (ADVIL) 800 MG tablet; Take 1 tablet (800 mg total) by mouth every 8 (eight) hours as needed. For pain; take after eating.  Myalgia -     cyclobenzaprine (FLEXERIL) 5 MG tablet; 1-2 at bedtime prn muscle spasm -     ibuprofen (ADVIL) 800 MG tablet; Take 1 tablet (800 mg total) by mouth every 8 (eight) hours as needed. For pain; take after eating.  Primary hypertension -     CMP14+EGFR -     amLODipine (NORVASC) 5 MG tablet; Take 1 tablet (5 mg total) by mouth daily. Continue all antihypertensives as prescribed.  Remember to bring in your blood pressure log with you for your follow up appointment.  DASH/Mediterranean Diets are healthier choices for HTN.    Obesity (BMI 30-39.9) -     Hemoglobin A1c Discussed diet and exercise for person with BMI >35. Instructed: You must burn more calories than you eat. Losing 5 percent of your body weight should be considered a success. In the longer term, losing more than 15 percent of your body weight and staying at this weight is an extremely good result. However, keep in mind that even losing 5 percent of your body weight leads to important health benefits, so try not to get discouraged if you're not able to lose more than this. Will recheck weight in 3-6 months.  Dyslipidemia, goal LDL below 100 -     Lipid panel -     atorvastatin (LIPITOR) 20 MG tablet; Take 1 tablet (20 mg total) by mouth daily. INSTRUCTIONS: Work on a low fat, heart healthy diet and participate in regular aerobic exercise program by working out at least 150 minutes per week; 5 days a week-30 minutes per day. Avoid red meat/beef/steak,  fried foods. junk foods, sodas, sugary drinks,  unhealthy snacking, alcohol and smoking.  Drink at least 80 oz of water per day and monitor your carbohydrate intake daily.      Patient has been counseled on age-appropriate routine health concerns for screening and prevention. These are reviewed and up-to-date. Referrals have been placed accordingly. Immunizations are up-to-date or declined.    Subjective:   Chief Complaint  Patient presents with  . Hypertension   HPI Sharon Johnson 45 y.o. female presents to office today to establish care VRI was used to communicate directly with patient for the entire encounter including providing detailed patient instructions.  She is accompanied by her daughter as well today.   Neck Pain: Event that precipitate these symptoms: none known. Onset of symptoms 3 years ago, stable since that time. Current symptoms are popping sensation with rotation of the head,  pain and stiffness in the posterior neck along with frontal headaches. Patient denies numbness or paresthesias. Patient has had recurrent self limited episodes of neck pain in the past.  Previous treatments include: none and NSAIDS and muscle relaxants. She has a history of migraines however states her current pain is different from her previous migraines  Essential Hypertension Will restart amlodipine 5 mg daily. Denies chest pain, shortness of breath, palpitations, lightheadedness, dizziness, headaches or BLE edema.  BP Readings from Last  3 Encounters:  12/03/20 (!) 136/91  12/28/19 120/74  06/22/19 (!) 146/84    Review of Systems  Constitutional: Negative for fever, malaise/fatigue and weight loss.  HENT: Negative.  Negative for nosebleeds.   Eyes: Negative.  Negative for blurred vision, double vision and photophobia.  Respiratory: Negative.  Negative for cough and shortness of breath.   Cardiovascular: Negative.  Negative for chest pain, palpitations and leg swelling.  Gastrointestinal: Negative.  Negative for heartburn,  nausea and vomiting.  Musculoskeletal: Positive for myalgias and neck pain.  Neurological: Positive for tingling (hands and wrists), sensory change and headaches. Negative for dizziness, focal weakness and seizures.  Psychiatric/Behavioral: Negative.  Negative for suicidal ideas.    Past Medical History:  Diagnosis Date  . Hypertension   . Migraine     Past Surgical History:  Procedure Laterality Date  . CHOLECYSTECTOMY N/A 11/10/2017   Procedure: LAPAROSCOPIC CHOLECYSTECTOMY WITH INTRAOPERATIVE CHOLANGIOGRAM;  Surgeon: Johnathan Hausen, MD;  Location: [facility];  Service: General;  Laterality: N/A;    Family History  Problem Relation Age of Onset  . Hypertension Mother   . Breast cancer Sister     Social History Reviewed with no changes to be made today.   Outpatient Medications Prior to Visit  Medication Sig Dispense Refill  . levonorgestrel (MIRENA) 20 MCG/24HR IUD 1 each by Intrauterine route once.    . Multiple Vitamins-Minerals (MULTIVITAMIN GUMMIES ADULT) CHEW Chew 1 tablet by mouth daily.    . polyethylene glycol (MIRALAX / GLYCOLAX) packet Take 17 g by mouth daily. 14 each 0  . amLODipine (NORVASC) 5 MG tablet Take 1 tablet (5 mg total) by mouth daily. 90 tablet 1  . atorvastatin (LIPITOR) 20 MG tablet Take 1 tablet (20 mg total) by mouth daily. 30 tablet 6  . cyclobenzaprine (FLEXERIL) 5 MG tablet 1-2 at bedtime prn muscle spasm 30 tablet 1  . ibuprofen (ADVIL) 600 MG tablet Take 1 tablet (600 mg total) by mouth every 8 (eight) hours as needed. For pain; take after eating 60 tablet 3   No facility-administered medications prior to visit.    No Known Allergies     Objective:    BP (!) 136/91   Pulse 66   Ht _0  (1.575 m)   Wt 176 lb 9.6 oz (80.1 kg)   SpO2 97%   BMI 32.30 kg/m  Wt Readings from Last 3 Encounters:  12/03/20 176 lb 9.6 oz (80.1 kg)  12/28/19 181 lb 11.2 oz (82.4 kg)  11/17/19 186 lb (84.4 kg)    Physical Exam Vitals and nursing note  reviewed.  Constitutional:      Appearance: She is well-developed.  HENT:     Head: Normocephalic and atraumatic.  Cardiovascular:     Rate and Rhythm: Normal rate and regular rhythm.     Heart sounds: Normal heart sounds. No murmur heard. No friction rub. No gallop.   Pulmonary:     Effort: Pulmonary effort is normal. No tachypnea or respiratory distress.     Breath sounds: Normal breath sounds. No decreased breath sounds, wheezing, rhonchi or rales.  Chest:     Chest wall: No tenderness.  Abdominal:     General: Bowel sounds are normal.     Palpations: Abdomen is soft.  Musculoskeletal:        General: Normal range of motion.     Cervical back: Normal range of motion. No signs of trauma or rigidity. Pain with movement, spinous process tenderness and muscular tenderness present.  Normal range of motion.  Skin:    General: Skin is warm and dry.  Neurological:     Mental Status: She is alert and oriented to person, place, and time.     Coordination: Coordination normal.  Psychiatric:        Behavior: Behavior normal. Behavior is cooperative.        Thought Content: Thought content normal.        Judgment: Judgment normal.          Patient has been counseled extensively about nutrition and exercise as well as the importance of adherence with medications and regular follow-up. The patient was given clear instructions to go to ER or return to medical center if symptoms don't improve, worsen or new problems develop. The patient verbalized understanding.   Follow-up: Return for 3 weeks LUKE BP check. See me in 12mhs.   ZGildardo Pounds FNP-BC [facility] [place] [place]  12/03/2020, 8:23 PM

## 2020-12-03 NOTE — Progress Notes (Signed)
Having pain in neck, left foot, and neck. Needs refills on medications.

## 2020-12-04 ENCOUNTER — Other Ambulatory Visit: Payer: Self-pay

## 2020-12-04 LAB — LIPID PANEL
Chol/HDL Ratio: 3.2 ratio (ref 0.0–4.4)
Cholesterol, Total: 190 mg/dL (ref 100–199)
HDL: 60 mg/dL (ref >39)
LDL Chol Calc (NIH): 105 mg/dL — ABNORMAL HIGH (ref 0–99)
Triglycerides: 145 mg/dL (ref 0–149)
VLDL Cholesterol Cal: 25 mg/dL (ref 5–40)

## 2020-12-04 LAB — CMP14+EGFR
ALT: 21 IU/L (ref 0–32)
AST: 14 IU/L (ref 0–40)
Albumin/Globulin Ratio: 2.1 (ref 1.2–2.2)
Albumin: 5.1 g/dL — ABNORMAL HIGH (ref 3.8–4.8)
Alkaline Phosphatase: 73 IU/L (ref 44–121)
BUN/Creatinine Ratio: 17 (ref 9–23)
BUN: 10 mg/dL (ref 6–24)
Bilirubin Total: 0.3 mg/dL (ref 0.0–1.2)
CO2: 26 mmol/L (ref 20–29)
Calcium: 10 mg/dL (ref 8.7–10.2)
Chloride: 101 mmol/L (ref 96–106)
Creatinine, Ser: 0.58 mg/dL (ref 0.57–1.00)
Globulin, Total: 2.4 g/dL (ref 1.5–4.5)
Glucose: 90 mg/dL (ref 65–99)
Potassium: 4.6 mmol/L (ref 3.5–5.2)
Sodium: 140 mmol/L (ref 134–144)
Total Protein: 7.5 g/dL (ref 6.0–8.5)
eGFR: 114 mL/min/{1.73_m2} (ref >59)

## 2020-12-04 LAB — HEMOGLOBIN A1C
Est. average glucose Bld gHb Est-mCnc: 111 mg/dL
Hgb A1c MFr Bld: 5.5 % (ref 4.8–5.6)

## 2020-12-04 LAB — CBC
Hematocrit: 43 % (ref 34.0–46.6)
Hemoglobin: 14.2 g/dL (ref 11.1–15.9)
MCH: 29.5 pg (ref 26.6–33.0)
MCHC: 33 g/dL (ref 31.5–35.7)
MCV: 89 fL (ref 79–97)
Platelets: 383 10*3/uL (ref 150–450)
RBC: 4.81 x10E6/uL (ref 3.77–5.28)
RDW: 13.1 % (ref 11.7–15.4)
WBC: 6.6 10*3/uL (ref 3.4–10.8)

## 2021-03-27 ENCOUNTER — Telehealth: Payer: Self-pay | Admitting: Nurse Practitioner

## 2021-03-27 NOTE — Telephone Encounter (Signed)
Copied from CRM [id number]. Topic: General - Other >> Mar 21, 2021  3:01 PM Randol Kern wrote: Reason for CRM: Seeking orange card, pt needs an Ultrasound but cannot currently afford to pay for it.  Best contact: [phone]

## 2021-03-27 NOTE — Telephone Encounter (Signed)
I return Pt call, LVM to call me back 

## 2021-04-03 ENCOUNTER — Other Ambulatory Visit: Payer: Self-pay | Admitting: Obstetrics & Gynecology

## 2021-04-03 DIAGNOSIS — Z1231 Encounter for screening mammogram for malignant neoplasm of breast: Secondary | ICD-10-CM

## 2021-04-04 ENCOUNTER — Other Ambulatory Visit: Payer: Self-pay | Admitting: Obstetrics & Gynecology

## 2021-04-04 DIAGNOSIS — Z1231 Encounter for screening mammogram for malignant neoplasm of breast: Secondary | ICD-10-CM

## 2021-04-10 ENCOUNTER — Ambulatory Visit
Admission: RE | Admit: 2021-04-10 | Discharge: 2021-04-10 | Disposition: A | Discharge status: Home / Self Care | Payer: No Typology Code available for payment source | Source: Ambulatory Visit | Attending: Obstetrics & Gynecology | Admitting: Obstetrics & Gynecology

## 2021-04-10 ENCOUNTER — Other Ambulatory Visit: Payer: Self-pay

## 2021-04-10 DIAGNOSIS — N6310 Unspecified lump in the right breast, unspecified quadrant: Secondary | ICD-10-CM

## 2021-04-10 DIAGNOSIS — Z1231 Encounter for screening mammogram for malignant neoplasm of breast: Secondary | ICD-10-CM

## 2021-04-22 ENCOUNTER — Ambulatory Visit
Admission: RE | Admit: 2021-04-22 | Discharge: 2021-04-22 | Disposition: A | Discharge status: Home / Self Care | Payer: No Typology Code available for payment source | Source: Ambulatory Visit | Attending: Obstetrics and Gynecology | Admitting: Obstetrics and Gynecology

## 2021-04-22 ENCOUNTER — Ambulatory Visit: Payer: Self-pay | Admitting: *Deleted

## 2021-04-22 ENCOUNTER — Other Ambulatory Visit: Payer: Self-pay

## 2021-04-22 VITALS — BP 126/78 | Wt 182.3 lb

## 2021-04-22 DIAGNOSIS — Z1239 Encounter for other screening for malignant neoplasm of breast: Secondary | ICD-10-CM

## 2021-04-22 DIAGNOSIS — N6310 Unspecified lump in the right breast, unspecified quadrant: Secondary | ICD-10-CM

## 2021-04-22 DIAGNOSIS — Z1211 Encounter for screening for malignant neoplasm of colon: Secondary | ICD-10-CM

## 2021-04-22 NOTE — Progress Notes (Signed)
Ms. Sharon Johnson is a 45 y.o. female who presents to [facility] clinic today with complaint of right breast lump x 6 months.    Pap Smear: Pap smear not completed today. Last Pap smear was 11/28/2019 at the [facility] clinic and was normal per patient. Per patient has no history of an abnormal Pap smear. No Pap smear results are in Epic.   Physical exam: Breasts Breasts symmetrical. No skin abnormalities bilateral breasts. No nipple retraction bilateral breasts. No nipple discharge bilateral breasts. No lymphadenopathy. No lumps palpated left breast. Palpated a lump within the right breast at 9 o'clock 1 cm from the nipple. Complaints of bilateral outer breast tenderness on exam.  MS DIGITAL SCREENING TOMO BILATERAL  Result Date: 01/02/2020 CLINICAL DATA:  Screening. EXAM: DIGITAL SCREENING BILATERAL MAMMOGRAM WITH TOMO AND CAD COMPARISON:  None ACR Breast Density Category d: The breast tissue is extremely dense, which lowers the sensitivity of mammography. FINDINGS: In the left breast, a possible asymmetry warrants further evaluation. In the right breast, no findings suspicious for malignancy. Images were processed with CAD. IMPRESSION: Further evaluation is suggested for possible asymmetry in the left breast. RECOMMENDATION: Diagnostic mammogram and possibly ultrasound of the left breast. (Code:FI-L-74M) The patient will be contacted regarding the findings, and additional imaging will be scheduled. BI-RADS CATEGORY  0: Incomplete. Need additional imaging evaluation and/or prior mammograms for comparison. Electronically Signed   By: Sherian Rein M.D.   On: 01/02/2020 09:01   MS DIGITAL DIAG TOMO UNI LEFT  Result Date: 01/16/2020 CLINICAL DATA:  Screening recall for possible left breast asymmetry. EXAM: DIGITAL DIAGNOSTIC UNILATERAL LEFT MAMMOGRAM WITH TOMO AND CAD; ULTRASOUND LEFT BREAST LIMITED LEFT BREAST ULTRASOUND COMPARISON:  Baseline screening mammogram dated  12/28/2019. ACR Breast Density Category d: The breast tissue is extremely dense, which lowers the sensitivity of mammography. FINDINGS: Additional tomograms were performed of the outer left breast. There is an oval mass in the outer left breast best visualized on the full paddle cc tomograms measuring approximately 0.8 cm. Mammographic images were processed with CAD. Targeted ultrasound of the outer left breast was performed demonstrating several areas of fibrocystic change. A cyst at 3 o'clock 5 cm from nipple measures 0.5 x 0.5 x 1 cm. An additional adjacent cyst also at 3 o'clock 5 cm from nipple measures 0.7 x 0.3 x 0.4 cm. One of these cysts is felt to correspond well with mammography findings. No suspicious masses or abnormality seen in the outer left breast sonographically. IMPRESSION: No findings of malignancy in the left breast. RECOMMENDATION: Screening mammogram in one year.(Code:SM-B-01Y) I have discussed the findings and recommendations with the patient. If applicable, a reminder letter will be sent to the patient regarding the next appointment. BI-RADS CATEGORY  2: Benign. Electronically Signed   By: Edwin Cap M.D.   On: 01/16/2020 09:50     Pelvic/Bimanual Pap is not indicated today per BCCCP guidelines.   Smoking History: Patient has never smoked.   Patient Navigation: Patient education provided. Access to services provided for patient through [place] program. Spanish interpreter Natale Lay from [facility] provided.   Colorectal Cancer Screening: Per patient has never had colonoscopy completed. FIT Test given to patient to complete. No complaints today.    Breast and Cervical Cancer Risk Assessment: Patient has family history of sister having breast cancer. Patient has no known genetic mutations or history of radiation treatment to the chest before age 79. Patient does not have history of cervical dysplasia, immunocompromised, or DES  exposure in-utero.  Risk Assessment      Risk Scores       04/22/2021 12/28/2019   Last edited by: Narda Rutherford, LPN McGill, Sherie Demetrius Charity, LPN   5-year risk: 0.4 % 0.3 %   Lifetime risk: 4.5 % 4.6 %           A: BCCCP exam without pap smear Complaint of right breast lump.  P: Referred patient to the [facility] for a diagnostic mammogram. Appointment scheduled Tuesday, April 22, 2021 at 1010.  Priscille Heidelberg, RN 04/22/2021 9:24 AM

## 2021-04-22 NOTE — Patient Instructions (Signed)
Explained breast self awareness with Sharon Johnson. Patient did not need a Pap smear today due to last Pap smear was 11/28/2019 per patient. Let her know BCCCP will cover Pap smears every 3 years unless has a history of abnormal Pap smears. Referred patient to the [facility] for a diagnostic mammogram. Appointment scheduled Tuesday, April 22, 2021 at 1010. Patient aware of appointment and will be there. Sharon Johnson verbalized understanding.  Dewane Timson, Kathaleen Maser, RN 9:24 AM

## 2021-05-02 ENCOUNTER — Other Ambulatory Visit: Payer: Self-pay

## 2021-05-29 ENCOUNTER — Ambulatory Visit: Payer: 59 | Attending: Physician Assistant | Admitting: Physician Assistant

## 2021-05-29 ENCOUNTER — Other Ambulatory Visit: Payer: Self-pay

## 2021-05-29 ENCOUNTER — Encounter: Payer: Self-pay | Admitting: Physician Assistant

## 2021-05-29 DIAGNOSIS — I10 Essential (primary) hypertension: Secondary | ICD-10-CM | POA: Diagnosis not present

## 2021-05-29 DIAGNOSIS — M542 Cervicalgia: Secondary | ICD-10-CM

## 2021-05-29 DIAGNOSIS — E785 Hyperlipidemia, unspecified: Secondary | ICD-10-CM

## 2021-05-29 DIAGNOSIS — M791 Myalgia, unspecified site: Secondary | ICD-10-CM | POA: Diagnosis not present

## 2021-05-29 MED ORDER — ATORVASTATIN CALCIUM 20 MG PO TABS
20.0000 mg | ORAL_TABLET | Freq: Every day | ORAL | 3 refills | Status: DC
  Filled 2021-05-29: qty 90, 90d supply, fill #0
  Filled 2021-11-24: qty 30, 30d supply, fill #0
  Filled 2021-11-27: qty 90, 90d supply, fill #0

## 2021-05-29 MED ORDER — AMLODIPINE BESYLATE 5 MG PO TABS
5.0000 mg | ORAL_TABLET | Freq: Every day | ORAL | 1 refills | Status: DC
  Filled 2021-05-29: qty 90, 90d supply, fill #0
  Filled 2021-11-24: qty 30, 30d supply, fill #0
  Filled 2021-11-27: qty 90, 90d supply, fill #0

## 2021-05-29 MED ORDER — CYCLOBENZAPRINE HCL 5 MG PO TABS
ORAL_TABLET | ORAL | 3 refills | Status: DC
Start: 2021-05-29 — End: 2021-11-28
  Filled 2021-05-29 – 2021-11-27 (×3): qty 60, 30d supply, fill #0

## 2021-05-29 MED ORDER — IBUPROFEN 800 MG PO TABS
800.0000 mg | ORAL_TABLET | Freq: Three times a day (TID) | ORAL | 1 refills | Status: AC | PRN
  Filled 2021-05-29: qty 180, 60d supply, fill #0
  Filled 2021-07-30: qty 180, 60d supply, fill #1
  Filled 2021-07-31 – 2022-01-13 (×2): qty 90, 30d supply, fill #0

## 2021-05-29 NOTE — Progress Notes (Signed)
Patient ID: Sharon Johnson, female   DOB: 1975/10/07, 45 y.o.   MRN: [id number]   Sharon Johnson, is a 45 y.o. female  IDC:[id number]  ILN:[id number]  DOB - February 11, 1976  Chief Complaint  Patient presents with   Hypertension       Subjective:   Sharon Johnson is a 45 y.o. female here today for continued pain in the back of her head that is causing HA.  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  She often has pain in her upper R back and neck and sometimes her neck "pops."  No numbness or weakness.  Needs BP meds RF.  No vision changes.    No problems updated.  ALLERGIES: No Known Allergies  PAST MEDICAL HISTORY: Past Medical History:  Diagnosis Date   Hypertension    Migraine     MEDICATIONS AT HOME: Prior to Admission medications   Medication Sig Start Date End Date Taking? Authorizing Provider  levonorgestrel (MIRENA) 20 MCG/24HR IUD 1 each by Intrauterine route once.   Yes [provider]  Multiple Vitamins-Minerals (MULTIVITAMIN GUMMIES ADULT) CHEW Chew 1 tablet by mouth daily.   Yes [provider]  amLODipine (NORVASC) 5 MG tablet Take 1 tablet (5 mg total) by mouth daily. 05/29/21   Anders Simmonds, PA-C  atorvastatin (LIPITOR) 20 MG tablet Take 1 tablet (20 mg total) by mouth daily. 05/29/21   Anders Simmonds, PA-C  cyclobenzaprine (FLEXERIL) 5 MG tablet 1-2 at bedtime prn muscle spasm 05/29/21   Georgian Co M, PA-C  ibuprofen (ADVIL) 800 MG tablet Take 1 tablet (800 mg total) by mouth every 8 (eight) hours as needed. For pain; take after eating. 05/29/21 08/27/21  Anders Simmonds, PA-C  polyethylene glycol (MIRALAX / GLYCOLAX) packet Take 17 g by mouth daily. Patient not taking: Reported on 05/29/2021 11/14/17   Rolly Salter, MD    ROS: Neg HEENT Neg resp Neg cardiac Neg GI Neg GU Neg MS Neg psych Neg neuro  Objective:   Vitals:   05/29/21 1521  BP: 137/88  Pulse: 76  SpO2: 95%  Weight: 183 lb 12.8 oz  (83.4 kg)  Height: 5\' 2"  (1.575 m)   Exam General appearance : Awake, alert, not in any distress. Speech Clear. Not toxic looking HEENT: Atraumatic and Normocephalic Neck: Supple, no JVD. No cervical lymphadenopathy.  Back of neck spasm in B trapezius R>L.  ROM is good.  UE DTR=intact B.   Chest: Good air entry bilaterally, CTAB.  No rales/rhonchi/wheezing CVS: S1 S2 regular, no murmurs.  Full S&ROM of BUE Extremities: B/L Lower Ext shows no edema, both legs are warm to touch Neurology: Awake alert, and oriented X 3, CN II-XII intact, Non focal Skin: No Rash  Data Review Lab Results  Component Value Date   HGBA1C 5.5 12/03/2020   HGBA1C 5.2 11/09/2017    Assessment & Plan   1. Dyslipidemia, goal LDL below 100 - atorvastatin (LIPITOR) 20 MG tablet; Take 1 tablet (20 mg total) by mouth daily.  Dispense: 90 tablet; Refill: 3  2. Primary hypertension controlled - amLODipine (NORVASC) 5 MG tablet; Take 1 tablet (5 mg total) by mouth daily.  Dispense: 90 tablet; Refill: 1  3. Neck pain Likely from overuse.  Yoga, stretching, massage gun, etc.  No red flags - cyclobenzaprine (FLEXERIL) 5 MG tablet; 1-2 at bedtime prn muscle spasm  Dispense: 60 tablet; Refill: 3 - ibuprofen (ADVIL) 800 MG tablet; Take 1 tablet (800 mg total) by mouth every 8 (  eight) hours as needed. For pain; take after eating.  Dispense: 180 tablet; Refill: 1 - DG Cervical Spine Complete; Future  4. Myalgia - cyclobenzaprine (FLEXERIL) 5 MG tablet; 1-2 at bedtime prn muscle spasm  Dispense: 60 tablet; Refill: 3 - ibuprofen (ADVIL) 800 MG tablet; Take 1 tablet (800 mg total) by mouth every 8 (eight) hours as needed. For pain; take after eating.  Dispense: 180 tablet; Refill: 1 - DG Cervical Spine Complete; Future  Patient have been counseled extensively about nutrition and exercise. Other issues discussed during this visit include: low cholesterol diet, weight control and daily exercise, foot care, annual eye  examinations at Ophthalmology, importance of adherence with medications and regular follow-up. We also discussed long term complications of uncontrolled diabetes and hypertension.   Return for 4-6 months with PCP.  The patient was given clear instructions to go to ER or return to medical center if symptoms don't improve, worsen or new problems develop. The patient verbalized understanding. The patient was told to call to get lab results if they haven't heard anything in the next week.      Georgian Co, PA-C [facility] [place], [place] [phone]   05/29/2021, 3:48 PM

## 2021-05-29 NOTE — Patient Instructions (Signed)
Ejercicios para el cuello Neck Exercises Pregunte al mdico qu ejercicios son seguros para usted. Haga los ejercicios exactamente como se lo haya indicado el mdico y gradelos como se lo hayan indicado. Es normal sentir un leve estiramiento, tironeo, opresin o Dentist al [employer] ejercicios. Detngase de inmediato si siente un dolor repentino o Community education officer. No comience a hacer estos ejercicios hasta que se lo indique el mdico. Los ejercicios para el cuello pueden ser importantes por muchos motivos. Pueden mejorar la fuerza y Pharmacologist la flexibilidad del cuello, lo que ser til para la parte superior de la espalda y para Estate agent de cuello. Ejercicios de estiramiento Estiramiento del cuello con rotacin  Sintese en una silla o prese. Apoye los pies en el piso, con una separacin del ancho de los hombros. Gire lentamente la cabeza (rtela) hacia la derecha hasta sentir un ligero estiramiento. Grela completamente hacia la derecha de modo que pueda ver por encima de su hombro derecho. No incline ni ladee la cabeza. Mantenga esta posicin durante 10 a 30 segundos. Gire lentamente la cabeza (rtela) hacia la izquierda hasta sentir un ligero estiramiento. Grela completamente hacia la izquierda de modo que pueda ver por encima de su hombro izquierdo. No incline ni ladee la cabeza. Mantenga esta posicin durante 10 a 30 segundos. Repita __________ veces. Realice este ejercicio __________ veces al da. Retraccin del cuello  Sintese en una silla resistente o prese. Mire hacia adelante. No doble el cuello. Use los dedos para empujar la barbilla hacia atrs (retraccin). No doble el cuello al [employer]. Siga mirando hacia adelante. Si hace el ejercicio correctamente, tendr Neomia Dear leve sensacin en la garganta y sentir que la nuca se estira. Mantenga la elongacin durante 1 o 2 segundos. Repita __________ veces. Realice este ejercicio __________ veces al  da. Ejercicios de fortalecimiento Presin con el cuello  Recustese sobre su espalda en una cama firme o en el suelo, y coloque una almohada debajo de la cabeza. Use los msculos del cuello para presionar la cabeza contra la almohada y enderezar la columna vertebral. Mantenga la posicin lo mejor que pueda. Mantenga la Carolyne Fiscal arriba (en posicin neutral) y el mentn hacia abajo. Cuente lentamente hasta 5 mientras mantiene esta posicin. Repita __________ veces. Realice este ejercicio __________ veces al da. Isometra Estos son ejercicios en los que se fortalecen los msculos del cuello mientras se mantiene el cuello quieto (isometra). Sintese en una silla con buen apoyo y coloque una mano sobre la frente. Mantenga la cabeza y la cara mirando hacia adelante. No flexione o extienda el cuello mientras hace ejercicios isomtricos. Empuje hacia adelante con la cabeza y el cuello mientras que con la mano ejerce cierta presin hacia el lado contrario. Mantenga esta posicin durante 10 segundos. Vuelva a Games developer, esta vez poniendo la mano contra la nuca. Use la cabeza y el cuello para empujar en la direccin contraria a la presin que ejerce con la mano. Para terminar, haga el mismo ejercicio en cada lado de la cabeza, empujando hacia los lados en la direccin contraria a la presin de Engineer, site. Repita __________ veces. Realice este ejercicio __________ veces al da. Levantamientos de la cabeza desde la posicin decbito prono  Acustese boca abajo (posicin prona) y [employer] codos de modo que el pecho y la parte superior de la espalda se eleven. Comience con la cabeza mirando hacia abajo, cerca del pecho. Coloque el mentn cerca del pecho o [place]. Eleve lentamente  la cabeza. Hgalo hasta quedar mirando hacia adelante. Luego contine elevando y estirando la cabeza hacia atrs lo ms que pueda con comodidad. Mantenga la cabeza elevada durante 5 segundos. A continuacin,  bjela lentamente hasta la posicin inicial. Repita __________ veces. Realice este ejercicio __________ veces al da. Levantamientos de la cabeza desde la posicin decbito supino  Acustese sobre la espalda (posicin decbito supino), doble las rodillas apuntando hacia el techo y [employer] pies apoyados en el piso. Levante lentamente la cabeza del suelo, y eleve el mentn hacia el [place]. Mantenga esta posicin durante 5 segundos. Repita __________ veces. Realice este ejercicio __________ veces al da. Retraccin escapular  Prese con los brazos a los costados. Mire hacia adelante. Lentamente, lleve los hombros (escpulas) hacia atrs y hacia abajo (retraccin) hasta sentir que la zona de los omplatos, en la parte alta de la espalda, se estira. Mantenga esta posicin durante 10 a 30 segundos. Reljese y luego repita el ejercicio. Repita __________ veces. Realice este ejercicio __________ veces al da. Comunquese con un mdico si: El dolor o las molestias en el cuello empeoran cuando hace un ejercicio. El dolor o las molestias en el cuello no mejoran en el trmino de las 2 horas posteriores a Copy. Si tiene alguno de [employer], deje de [employer] ejercicios de inmediato. No vuelva a hacer los ejercicios a menos que el mdico lo autorice. Solicite ayuda de inmediato si: Siente un dolor sbito e intenso en el cuello. Si esto ocurre, deje de [employer] ejercicios de inmediato. No vuelva a hacer los ejercicios a menos que el mdico lo autorice. Esta informacin no tiene Theme park manager el consejo del mdico. Asegrese de hacerle al mdico cualquier pregunta que tenga. Document Revised: 01/24/2021 Document Reviewed: 01/24/2021 Elsevier Patient Education  2022 [employer].

## 2021-06-04 ENCOUNTER — Ambulatory Visit: Payer: Self-pay | Admitting: *Deleted

## 2021-06-04 NOTE — Telephone Encounter (Signed)
Pt has fever, sore throat, cough, runny nose, feels bad, chills  She doesn't know what her temperature is b/c she doesn't have a thermometer, but she can tell.    Please call back with an interpreter, spanish  No appts at [facility], and no answer when I try to call   Called patient to review symptoms via interpreter [phone]. C/o dry cough, sore throat, chills and feeling very bad since last seen in [facility] 05/29/21. C/o ears feeling "stuffed" , difficulty swallowing, coughing up "like a ball with blood in it " due to dry throat. C/o throat dry x 3 weeks and coughing with blood in it since Monday  due to dry throat . Encouraged to go to ED and patient would like appt with PCP. No available appt until 06/18/21. Denies chest pain , difficulty breathing. Does not have thermometer to check for fever but feels chills. Has tried ginger and tea and drinking warm liquids . Throat remain dry and painful. Requesting medication to help with symptoms. Please advise . Care advise given. Patient verbalized understanding of care advise and would like appt with PCP instead of going to UC or ED.

## 2021-06-04 NOTE — Telephone Encounter (Signed)
Reason for Disposition  [1] SEVERE sore throat AND [2] present > 24 hours  Answer Assessment - Initial Assessment Questions 1. ONSET: "When did the nasal discharge start?"      na 2. AMOUNT: "How much discharge is there?"      na 3. COUGH: "Do you have a cough?" If yes, ask: "Describe the color of your sputum" (clear, white, yellow, green)     Dry cough  4. RESPIRATORY DISTRESS: "Describe your breathing."      ok 5. FEVER: "Do you have a fever?" If Yes, ask: "What is your temperature, how was it measured, and when did it start?"     C/o chills . Did not measure temp. No thermometer 6. SEVERITY: "Overall, how bad are you feeling right now?" (e.g., doesn't interfere with normal activities, staying home from school/work, staying in bed)      Very bad. Cough, sore throat  7. OTHER SYMPTOMS: "Do you have any other symptoms?" (e.g., sore throat, earache, wheezing, vomiting)     Sore throat, earache 8. PREGNANCY: "Is there any chance you are pregnant?" "When was your last menstrual period?"     na  Protocols used: Common Cold-A-AH

## 2021-06-04 NOTE — Telephone Encounter (Signed)
Requested assistance from Max Sane-  Message per Max Sane, Registrar at [facility]:  I have called twice but no answer. I have left a vm asking to call the office back to schedule the virtual appt

## 2021-07-31 ENCOUNTER — Other Ambulatory Visit: Payer: Self-pay

## 2021-08-04 ENCOUNTER — Other Ambulatory Visit: Payer: Self-pay

## 2021-08-12 ENCOUNTER — Other Ambulatory Visit: Payer: Self-pay

## 2021-11-24 ENCOUNTER — Ambulatory Visit: Payer: Self-pay | Admitting: *Deleted

## 2021-11-24 ENCOUNTER — Other Ambulatory Visit: Payer: Self-pay

## 2021-11-24 NOTE — Telephone Encounter (Signed)
?  Chief Complaint: head and neck pain ?Symptoms: neck pain, headache, shoulder/arm pain-chronic- worse for 1 week ?Frequency: comes and goes- neck pain seems more constant- now have arm pain ?Pertinent Negatives: Patient denies  ?Disposition: [] ED /[x] Urgent Care (no appt availability in office) / [] Appointment(In office/virtual)/ []  [facility]/ [] Home Care/ [] Refused Recommended Disposition /[] [facility]/ []  Follow-up with PCP ?Additional Notes: Patient offered appointment today- but can not come due to work schedule- requesting ltae afternoon appointment. Patient advised UC for evaluation- no open appointment  ?

## 2021-11-24 NOTE — Telephone Encounter (Signed)
Reason for Disposition ? [1] SEVERE neck pain (e.g., excruciating, unable to do any normal activities) AND [2] not improved after 2 hours of pain medicine ? ?Answer Assessment - Initial Assessment Questions ?1. LOCATION: "Where does it hurt?"  ?    Back of head/entire into neck ?2. ONSET: "When did the headache start?" (Minutes, hours or days)  ?    1 week- unable to sleep- pain in head, neck, arm ?3. PATTERN: "Does the pain come and go, or has it been constant since it started?" ?    Comes and goes-in the head- neck pain is constant ?4. SEVERITY: "How bad is the pain?" and "What does it keep you from doing?"  (e.g., Scale 1-10; mild, moderate, or severe) ?  - MILD (1-3): doesn't interfere with normal activities  ?  - MODERATE (4-7): interferes with normal activities or awakens from sleep  ?  - SEVERE (8-10): excruciating pain, unable to do any normal activities    ?    *No Answer* ?5. RECURRENT SYMPTOM: "Have you ever had headaches before?" If Yes, ask: "When was the last time?" and "What happened that time?"  ?    Yes- severe, has been seen in office- ibuprofen, muscle relaxer ?6. CAUSE: "What do you think is causing the headache?" ?    Muscle pain ?7. MIGRAINE: "Have you been diagnosed with migraine headaches?" If Yes, ask: "Is this headache similar?"  ?    Yes- hx diagnosis of migraine- but this is different ?8. HEAD INJURY: "Has there been any recent injury to the head?"  ?    no ?9. OTHER SYMPTOMS: "Do you have any other symptoms?" (fever, stiff neck, eye pain, sore throat, cold symptoms) ?    Shoulder/Arm pain- right, lower back pain ?10. PREGNANCY: "Is there any chance you are pregnant?" "When was your last menstrual period?" ? ?Protocols used: Headache-A-AH, Neck Pain or Stiffness-A-AH ? ?

## 2021-11-27 ENCOUNTER — Other Ambulatory Visit (HOSPITAL_COMMUNITY): Payer: Self-pay

## 2021-11-27 ENCOUNTER — Other Ambulatory Visit: Payer: Self-pay

## 2021-11-28 ENCOUNTER — Ambulatory Visit: Payer: Commercial Managed Care - HMO | Attending: Nurse Practitioner | Admitting: Nurse Practitioner

## 2021-11-28 ENCOUNTER — Ambulatory Visit
Admission: RE | Admit: 2021-11-28 | Discharge: 2021-11-28 | Disposition: A | Discharge status: Home / Self Care | Payer: Commercial Managed Care - HMO | Source: Ambulatory Visit | Attending: Nurse Practitioner | Admitting: Nurse Practitioner

## 2021-11-28 ENCOUNTER — Other Ambulatory Visit: Payer: Self-pay

## 2021-11-28 ENCOUNTER — Encounter: Payer: Self-pay | Admitting: Nurse Practitioner

## 2021-11-28 VITALS — BP 138/87 | HR 63 | Wt 183.2 lb

## 2021-11-28 DIAGNOSIS — I10 Essential (primary) hypertension: Secondary | ICD-10-CM

## 2021-11-28 DIAGNOSIS — M5412 Radiculopathy, cervical region: Secondary | ICD-10-CM | POA: Diagnosis not present

## 2021-11-28 DIAGNOSIS — M542 Cervicalgia: Secondary | ICD-10-CM

## 2021-11-28 DIAGNOSIS — M791 Myalgia, unspecified site: Secondary | ICD-10-CM | POA: Diagnosis not present

## 2021-11-28 DIAGNOSIS — G43909 Migraine, unspecified, not intractable, without status migrainosus: Secondary | ICD-10-CM | POA: Diagnosis not present

## 2021-11-28 DIAGNOSIS — E785 Hyperlipidemia, unspecified: Secondary | ICD-10-CM

## 2021-11-28 DIAGNOSIS — D72829 Elevated white blood cell count, unspecified: Secondary | ICD-10-CM

## 2021-11-28 DIAGNOSIS — R7309 Other abnormal glucose: Secondary | ICD-10-CM

## 2021-11-28 MED ORDER — CYCLOBENZAPRINE HCL 5 MG PO TABS
ORAL_TABLET | ORAL | 3 refills | Status: DC
  Filled 2021-11-28: qty 60, fill #0
  Filled 2022-01-13 (×2): qty 60, 30d supply, fill #0
  Filled 2022-02-08: qty 60, 30d supply, fill #1

## 2021-11-28 MED ORDER — ATORVASTATIN CALCIUM 20 MG PO TABS
20.0000 mg | ORAL_TABLET | Freq: Every day | ORAL | 3 refills | Status: DC
  Filled 2021-11-28 – 2022-01-13 (×2): qty 90, 90d supply, fill #0

## 2021-11-28 MED ORDER — AMLODIPINE BESYLATE 5 MG PO TABS
5.0000 mg | ORAL_TABLET | Freq: Every day | ORAL | 1 refills | Status: DC
  Filled 2021-11-28 – 2022-01-13 (×2): qty 90, 90d supply, fill #0

## 2021-11-28 MED ORDER — TOPIRAMATE 25 MG PO TABS
25.0000 mg | ORAL_TABLET | Freq: Every day | ORAL | 0 refills | Status: DC
  Filled 2021-11-28: qty 60, 30d supply, fill #0
  Filled 2022-01-13: qty 30, 15d supply, fill #1

## 2021-11-28 MED ORDER — IBUPROFEN 800 MG PO TABS
800.0000 mg | ORAL_TABLET | Freq: Three times a day (TID) | ORAL | 3 refills | Status: DC | PRN
  Filled 2021-11-28: qty 60, 20d supply, fill #0
  Filled 2022-01-13 – 2022-04-03 (×2): qty 60, 20d supply, fill #1

## 2021-11-28 NOTE — Progress Notes (Signed)
? ?Assessment & Plan:  ?Tessy was seen today for hypertension. ? ?Diagnoses and all orders for this visit: ? ?Primary hypertension ?-     amLODipine (NORVASC) 5 MG tablet; Take 1 tablet (5 mg total) by mouth daily. ?-     CMP14+EGFR ? ?Cervical radiculopathy ?-     cyclobenzaprine (FLEXERIL) 5 MG tablet; Take 1 to 2 tablets by mouth at bedtime as needed for muscle spasm ?-     DG Cervical Spine Complete; Future ?-     ibuprofen (ADVIL) 800 MG tablet; Take 1 tablet (800 mg total) by mouth every 8 (eight) hours as needed. ? ?Myalgia ?-     cyclobenzaprine (FLEXERIL) 5 MG tablet; Take 1 to 2 tablets by mouth at bedtime as needed for muscle spasm ? ?Migraine without status migrainosus, not intractable, unspecified migraine type ?-     topiramate (TOPAMAX) 25 MG tablet; Take 1-2 tablets (25-50 mg total) by mouth daily. Start with 1/2 tablet for 2 weeks and if no relief may take 1 tablet daily ?-     ibuprofen (ADVIL) 800 MG tablet; Take 1 tablet (800 mg total) by mouth every 8 (eight) hours as needed. ? ?Dyslipidemia, goal LDL below 100 ?-     atorvastatin (LIPITOR) 20 MG tablet; Take 1 tablet (20 mg total) by mouth daily. ?-     Lipid panel ? ?Leukocytosis, unspecified type ?-     CBC ? ?Elevated glucose ?-     Hemoglobin A1c ? ? ? ?Patient has been counseled on age-appropriate routine health concerns for screening and prevention. These are reviewed and up-to-date. Referrals have been placed accordingly. Immunizations are up-to-date or declined.    ?Subjective:  ? ?Chief Complaint  ?Patient presents with  ? Hypertension  ? ?HPI ?Sharon Johnson 46 y.o. female presents to office today for follow up to HTN. ? ?She is accompanied by her daughter. VRI was used to communicate directly with patient for the entire encounter including providing detailed patient instructions.   ? ?HTN ?She is not taking amlodipine. She ran out a few months ago and did not request any refills.  ?BP Readings from Last 3 Encounters:   ?11/28/21 138/87  ?05/29/21 137/88  ?04/22/21 126/78  ?  ? ? ?Chronic neck pain ?Event that precipitated these symptoms: none known. Onset of symptoms several years ago, stable since that time. Current symptoms are popping sensation with rotation of the head,  pain and stiffness in the posterior neck along with frontal headaches. Patient denies numbness or paresthesias. Patient has had recurrent self limited episodes of neck pain in the past.  Previous treatments include:  NSAIDS and muscle relaxants. She has a history of migraines however states her current pain is different from her previous migraines ?  ? ?Headaches ?Chronic in nature and occurring at the top of her head and the back of her neck. Associated symptoms include light sensitivity. She takes excedrin for her headaches. It calms the headaches down but does not completely resolve them.  ? ? ?Review of Systems  ?Constitutional:  Negative for fever, malaise/fatigue and weight loss.  ?HENT: Negative.  Negative for nosebleeds.   ?Eyes: Negative.  Negative for blurred vision, double vision and photophobia.  ?Respiratory: Negative.  Negative for cough and shortness of breath.   ?Cardiovascular: Negative.  Negative for chest pain, palpitations and leg swelling.  ?Gastrointestinal: Negative.  Negative for heartburn, nausea and vomiting.  ?Musculoskeletal:  Positive for myalgias and neck pain.  ?Neurological:  Positive for  headaches. Negative for dizziness, focal weakness and seizures.  ?Psychiatric/Behavioral: Negative.  Negative for suicidal ideas.   ? ?Past Medical History:  ?Diagnosis Date  ? Hypertension   ? Migraine   ? ? ?Past Surgical History:  ?Procedure Laterality Date  ? CHOLECYSTECTOMY N/A 11/10/2017  ? Procedure: LAPAROSCOPIC CHOLECYSTECTOMY WITH INTRAOPERATIVE CHOLANGIOGRAM;  Surgeon: Johnathan Hausen, MD;  Location: [facility];  Service: General;  Laterality: N/A;  ? ? ?Family History  ?Problem Relation Age of Onset  ? Hypertension Mother   ? Breast  cancer Sister   ? ? ?Social History Reviewed with no changes to be made today.  ? ?Outpatient Medications Prior to Visit  ?Medication Sig Dispense Refill  ? Multiple Vitamins-Minerals (MULTIVITAMIN GUMMIES ADULT) CHEW Chew 1 tablet by mouth daily.    ? amLODipine (NORVASC) 5 MG tablet Take 1 tablet (5 mg total) by mouth daily. 90 tablet 1  ? atorvastatin (LIPITOR) 20 MG tablet Take 1 tablet (20 mg total) by mouth daily. 90 tablet 3  ? cyclobenzaprine (FLEXERIL) 5 MG tablet Take 1 to 2 tablets by mouth at bedtime as needed for muscle spasm 60 tablet 3  ? levonorgestrel (MIRENA) 20 MCG/24HR IUD 1 each by Intrauterine route once.    ? polyethylene glycol (MIRALAX / GLYCOLAX) packet Take 17 g by mouth daily. (Patient not taking: Reported on 05/29/2021) 14 each 0  ? ?No facility-administered medications prior to visit.  ? ? ?No Known Allergies ? ?   ?Objective:  ?  ?BP 138/87   Pulse 63   Wt 183 lb 3.2 oz (83.1 kg)   SpO2 98%   BMI 33.51 kg/m?  ?Wt Readings from Last 3 Encounters:  ?11/28/21 183 lb 3.2 oz (83.1 kg)  ?05/29/21 183 lb 12.8 oz (83.4 kg)  ?04/22/21 182 lb 4.8 oz (82.7 kg)  ? ? ?Physical Exam ?Vitals and nursing note reviewed.  ?Constitutional:   ?   Appearance: She is well-developed.  ?HENT:  ?   Head: Normocephalic and atraumatic.  ?Cardiovascular:  ?   Rate and Rhythm: Normal rate and regular rhythm.  ?   Heart sounds: Normal heart sounds. No murmur heard. ?  No friction rub. No gallop.  ?Pulmonary:  ?   Effort: Pulmonary effort is normal. No tachypnea or respiratory distress.  ?   Breath sounds: Normal breath sounds. No decreased breath sounds, wheezing, rhonchi or rales.  ?Chest:  ?   Chest wall: No tenderness.  ?Abdominal:  ?   General: Bowel sounds are normal.  ?   Palpations: Abdomen is soft.  ?Musculoskeletal:     ?   General: Normal range of motion.  ?   Cervical back: Normal range of motion.  ?Skin: ?   General: Skin is warm and dry.  ?Neurological:  ?   Mental Status: She is alert and oriented  to person, place, and time.  ?   Coordination: Coordination normal.  ?Psychiatric:     ?   Behavior: Behavior normal. Behavior is cooperative.     ?   Thought Content: Thought content normal.     ?   Judgment: Judgment normal.  ? ? ? ? ?   ?Patient has been counseled extensively about nutrition and exercise as well as the importance of adherence with medications and regular follow-up. The patient was given clear instructions to go to ER or return to medical center if symptoms don't improve, worsen or new problems develop. The patient verbalized understanding.  ? ?Follow-up: Return in about 3  months (around 02/28/2022).  ? ?Gildardo Pounds, FNP-BC ?[facility] ?[place], [place] ?[phone]   ?11/28/2021, 7:19 PM ?

## 2021-11-29 LAB — CBC
Hematocrit: 40.6 % (ref 34.0–46.6)
Hemoglobin: 13.7 g/dL (ref 11.1–15.9)
MCH: 29.8 pg (ref 26.6–33.0)
MCHC: 33.7 g/dL (ref 31.5–35.7)
MCV: 89 fL (ref 79–97)
Platelets: 335 10*3/uL (ref 150–450)
RBC: 4.59 x10E6/uL (ref 3.77–5.28)
RDW: 13.1 % (ref 11.7–15.4)
WBC: 6.5 10*3/uL (ref 3.4–10.8)

## 2021-11-29 LAB — CMP14+EGFR
ALT: 19 IU/L (ref 0–32)
AST: 15 IU/L (ref 0–40)
Albumin/Globulin Ratio: 1.8 (ref 1.2–2.2)
Albumin: 4.9 g/dL — ABNORMAL HIGH (ref 3.8–4.8)
Alkaline Phosphatase: 75 IU/L (ref 44–121)
BUN/Creatinine Ratio: 22 (ref 9–23)
BUN: 12 mg/dL (ref 6–24)
Bilirubin Total: <0.2 mg/dL (ref 0.0–1.2)
CO2: 24 mmol/L (ref 20–29)
Calcium: 9.9 mg/dL (ref 8.7–10.2)
Chloride: 103 mmol/L (ref 96–106)
Creatinine, Ser: 0.55 mg/dL — ABNORMAL LOW (ref 0.57–1.00)
Globulin, Total: 2.7 g/dL (ref 1.5–4.5)
Glucose: 88 mg/dL (ref 70–99)
Potassium: 4.2 mmol/L (ref 3.5–5.2)
Sodium: 142 mmol/L (ref 134–144)
Total Protein: 7.6 g/dL (ref 6.0–8.5)
eGFR: 114 mL/min/1.73

## 2021-11-29 LAB — LIPID PANEL
Chol/HDL Ratio: 3.7 ratio (ref 0.0–4.4)
Cholesterol, Total: 187 mg/dL (ref 100–199)
HDL: 50 mg/dL
LDL Chol Calc (NIH): 102 mg/dL — ABNORMAL HIGH (ref 0–99)
Triglycerides: 203 mg/dL — ABNORMAL HIGH (ref 0–149)
VLDL Cholesterol Cal: 35 mg/dL (ref 5–40)

## 2021-11-29 LAB — HEMOGLOBIN A1C
Est. average glucose Bld gHb Est-mCnc: 111 mg/dL
Hgb A1c MFr Bld: 5.5 % (ref 4.8–5.6)

## 2021-12-01 ENCOUNTER — Other Ambulatory Visit: Payer: Self-pay

## 2022-01-13 ENCOUNTER — Other Ambulatory Visit: Payer: Self-pay | Admitting: Nurse Practitioner

## 2022-01-13 ENCOUNTER — Other Ambulatory Visit (HOSPITAL_COMMUNITY): Payer: Self-pay

## 2022-01-13 DIAGNOSIS — G43909 Migraine, unspecified, not intractable, without status migrainosus: Secondary | ICD-10-CM

## 2022-01-13 MED ORDER — TOPIRAMATE 25 MG PO TABS
25.0000 mg | ORAL_TABLET | Freq: Every day | ORAL | 0 refills | Status: DC
  Filled 2022-01-13: qty 90, 45d supply, fill #0

## 2022-02-09 ENCOUNTER — Other Ambulatory Visit (HOSPITAL_COMMUNITY): Payer: Self-pay

## 2022-03-06 ENCOUNTER — Ambulatory Visit: Payer: Commercial Managed Care - HMO | Attending: Nurse Practitioner | Admitting: Nurse Practitioner

## 2022-03-06 ENCOUNTER — Encounter: Payer: Self-pay | Admitting: Nurse Practitioner

## 2022-03-06 ENCOUNTER — Other Ambulatory Visit: Payer: Self-pay

## 2022-03-06 VITALS — BP 123/82 | HR 67 | Temp 98.0°F | Wt 180.2 lb

## 2022-03-06 DIAGNOSIS — Z1231 Encounter for screening mammogram for malignant neoplasm of breast: Secondary | ICD-10-CM

## 2022-03-06 DIAGNOSIS — M791 Myalgia, unspecified site: Secondary | ICD-10-CM

## 2022-03-06 DIAGNOSIS — I10 Essential (primary) hypertension: Secondary | ICD-10-CM

## 2022-03-06 DIAGNOSIS — M546 Pain in thoracic spine: Secondary | ICD-10-CM

## 2022-03-06 DIAGNOSIS — E785 Hyperlipidemia, unspecified: Secondary | ICD-10-CM | POA: Diagnosis not present

## 2022-03-06 DIAGNOSIS — Z1211 Encounter for screening for malignant neoplasm of colon: Secondary | ICD-10-CM

## 2022-03-06 DIAGNOSIS — M5412 Radiculopathy, cervical region: Secondary | ICD-10-CM

## 2022-03-06 DIAGNOSIS — G43909 Migraine, unspecified, not intractable, without status migrainosus: Secondary | ICD-10-CM

## 2022-03-06 MED ORDER — DICLOFENAC SODIUM 1 % EX GEL
4.0000 g | Freq: Four times a day (QID) | CUTANEOUS | 3 refills | Status: AC
  Filled 2022-03-06: qty 100, 6d supply, fill #0
  Filled 2022-04-03: qty 100, 6d supply, fill #1

## 2022-03-06 MED ORDER — CYCLOBENZAPRINE HCL 5 MG PO TABS
ORAL_TABLET | ORAL | 3 refills | Status: DC
  Filled 2022-03-06: qty 60, 30d supply, fill #0
  Filled 2022-04-03: qty 60, 30d supply, fill #1
  Filled 2022-05-07: qty 60, 30d supply, fill #2

## 2022-03-06 MED ORDER — TOPIRAMATE 25 MG PO TABS
25.0000 mg | ORAL_TABLET | Freq: Every day | ORAL | 1 refills | Status: DC
  Filled 2022-03-06: qty 60, 30d supply, fill #0
  Filled 2022-04-03: qty 60, 30d supply, fill #1
  Filled 2022-05-07: qty 60, 30d supply, fill #2

## 2022-03-06 MED ORDER — ATORVASTATIN CALCIUM 20 MG PO TABS
20.0000 mg | ORAL_TABLET | Freq: Every day | ORAL | 3 refills | Status: DC
  Filled 2022-03-06: qty 30, 30d supply, fill #0
  Filled 2022-04-03: qty 90, 90d supply, fill #1

## 2022-03-06 MED ORDER — AMLODIPINE BESYLATE 5 MG PO TABS
5.0000 mg | ORAL_TABLET | Freq: Every day | ORAL | 1 refills | Status: DC
  Filled 2022-03-06: qty 30, 30d supply, fill #0
  Filled 2022-04-03: qty 90, 90d supply, fill #1

## 2022-03-06 NOTE — Progress Notes (Signed)
Assessment & Plan:  Sharon Johnson was seen today for hypertension.  Diagnoses and all orders for this visit:  Primary hypertension -     amLODipine (NORVASC) 5 MG tablet; Take 1 tablet (5 mg total) by mouth daily.  Colon cancer screening -     Ambulatory referral to Gastroenterology  Breast cancer screening by mammogram -     MM DIAG BREAST TOMO BILATERAL; Future  Dyslipidemia, goal LDL below 100 -     atorvastatin (LIPITOR) 20 MG tablet; Take 1 tablet (20 mg total) by mouth daily.  Myalgia -     cyclobenzaprine (FLEXERIL) 5 MG tablet; Take 1 to 2 tablets by mouth at bedtime as needed for muscle spasm  Migraine without status migrainosus, not intractable, unspecified migraine type -     topiramate (TOPAMAX) 25 MG tablet; Take 1-2 tablets (25-50 mg total) by mouth daily. Start with 1/2 tablet for 2 weeks and if no relief may take 1 tablet daily  Acute right-sided thoracic back pain -     diclofenac Sodium (VOLTAREN) 1 % GEL; Apply 4 g topically 4 (four) times daily.    Patient has been counseled on age-appropriate routine health concerns for screening and prevention. These are reviewed and up-to-date. Referrals have been placed accordingly. Immunizations are up-to-date or declined.    Subjective:   Chief Complaint  Patient presents with   Hypertension   HPI KIANA HOLLAR 46 y.o. female presents to office today for HTN  She has a past medical history of Hypertension and Migraine.    Migraines well controlled with topamax.   Back Pain She has complaints of right sided thoracic back pain that comes and goes. Unrelated to any injury.  Onset one week ago.    States last week for a few days she had generalized body aches, fever, sweating and headache. Symptoms resolved on their own and she denies any current symptoms.   HTN Blood pressure well controlled with amlodipine 5 mg daily.  BP Readings from Last 3 Encounters:  03/06/22 123/82  11/28/21 138/87  05/29/21  137/88    Review of Systems  Constitutional:  Negative for fever, malaise/fatigue and weight loss.  HENT: Negative.  Negative for nosebleeds.   Eyes: Negative.  Negative for blurred vision, double vision and photophobia.  Respiratory: Negative.  Negative for cough and shortness of breath.   Cardiovascular: Negative.  Negative for chest pain, palpitations and leg swelling.  Gastrointestinal: Negative.  Negative for heartburn, nausea and vomiting.  Musculoskeletal:  Positive for back pain and myalgias.  Neurological: Negative.  Negative for dizziness, focal weakness, seizures and headaches.  Psychiatric/Behavioral: Negative.  Negative for suicidal ideas.     Past Medical History:  Diagnosis Date   Hypertension    Migraine     Past Surgical History:  Procedure Laterality Date   CHOLECYSTECTOMY N/A 11/10/2017   Procedure: LAPAROSCOPIC CHOLECYSTECTOMY WITH INTRAOPERATIVE CHOLANGIOGRAM;  Surgeon: Luretha Murphy, MD;  Location: [facility];  Service: General;  Laterality: N/A;    Family History  Problem Relation Age of Onset   Hypertension Mother    Breast cancer Sister     Social History Reviewed with no changes to be made today.   Outpatient Medications Prior to Visit  Medication Sig Dispense Refill   ibuprofen (ADVIL) 800 MG tablet Take 1 tablet (800 mg total) by mouth every 8 (eight) hours as needed. 60 tablet 3   amLODipine (NORVASC) 5 MG tablet Take 1 tablet (5 mg total) by mouth daily. 90  tablet 1   atorvastatin (LIPITOR) 20 MG tablet Take 1 tablet (20 mg total) by mouth daily. 90 tablet 3   cyclobenzaprine (FLEXERIL) 5 MG tablet Take 1 to 2 tablets by mouth at bedtime as needed for muscle spasm 60 tablet 3   levonorgestrel (MIRENA) 20 MCG/24HR IUD 1 each by Intrauterine route once. (Patient not taking: Reported on 03/06/2022)     Multiple Vitamins-Minerals (MULTIVITAMIN GUMMIES ADULT) CHEW Chew 1 tablet by mouth daily. (Patient not taking: Reported on 03/06/2022)     polyethylene  glycol (MIRALAX / GLYCOLAX) packet Take 17 g by mouth daily. (Patient not taking: Reported on 05/29/2021) 14 each 0   topiramate (TOPAMAX) 25 MG tablet Take 1-2 tablets (25-50 mg total) by mouth daily. Start with 1/2 tablet for 2 weeks and if no relief may take 1 tablet daily (Patient not taking: Reported on 03/06/2022) 90 tablet 0   No facility-administered medications prior to visit.    No Known Allergies     Objective:    BP 123/82   Pulse 67   Temp 98 F (36.7 C) (Temporal)   Wt 180 lb 3.2 oz (81.7 kg)   SpO2 99%   BMI 32.96 kg/m  Wt Readings from Last 3 Encounters:  03/06/22 180 lb 3.2 oz (81.7 kg)  11/28/21 183 lb 3.2 oz (83.1 kg)  05/29/21 183 lb 12.8 oz (83.4 kg)    Physical Exam Vitals and nursing note reviewed.  Constitutional:      Appearance: She is well-developed.  HENT:     Head: Normocephalic and atraumatic.  Cardiovascular:     Rate and Rhythm: Normal rate and regular rhythm.     Heart sounds: Normal heart sounds. No murmur heard.    No friction rub. No gallop.  Pulmonary:     Effort: Pulmonary effort is normal. No tachypnea or respiratory distress.     Breath sounds: Normal breath sounds. No decreased breath sounds, wheezing, rhonchi or rales.  Chest:     Chest wall: No tenderness.  Abdominal:     General: Bowel sounds are normal.     Palpations: Abdomen is soft.  Musculoskeletal:        General: Normal range of motion.     Cervical back: Normal range of motion.  Skin:    General: Skin is warm and dry.  Neurological:     Mental Status: She is alert and oriented to person, place, and time.     Coordination: Coordination normal.  Psychiatric:        Behavior: Behavior normal. Behavior is cooperative.        Thought Content: Thought content normal.        Judgment: Judgment normal.          Patient has been counseled extensively about nutrition and exercise as well as the importance of adherence with medications and regular follow-up. The  patient was given clear instructions to go to ER or return to medical center if symptoms don't improve, worsen or new problems develop. The patient verbalized understanding.   Follow-up: Return for PAP SMEAR in September or October. Claiborne Rigg, FNP-BC [facility] [place], [place] [phone]   03/07/2022, 7:48 AM

## 2022-03-07 ENCOUNTER — Encounter: Payer: Self-pay | Admitting: Nurse Practitioner

## 2022-03-25 ENCOUNTER — Other Ambulatory Visit: Payer: Self-pay

## 2022-04-04 ENCOUNTER — Other Ambulatory Visit (HOSPITAL_COMMUNITY): Payer: Self-pay

## 2022-04-05 ENCOUNTER — Emergency Department (HOSPITAL_COMMUNITY)
Admission: EM | Admit: 2022-04-05 | Discharge: 2022-04-05 | Disposition: A | Discharge status: Home / Self Care | Payer: Commercial Managed Care - HMO | Attending: Emergency Medicine | Admitting: Emergency Medicine

## 2022-04-05 ENCOUNTER — Emergency Department (HOSPITAL_COMMUNITY): Payer: Commercial Managed Care - HMO

## 2022-04-05 ENCOUNTER — Encounter (HOSPITAL_COMMUNITY): Payer: Self-pay

## 2022-04-05 DIAGNOSIS — M546 Pain in thoracic spine: Secondary | ICD-10-CM | POA: Insufficient documentation

## 2022-04-05 DIAGNOSIS — M542 Cervicalgia: Secondary | ICD-10-CM | POA: Insufficient documentation

## 2022-04-05 DIAGNOSIS — G8929 Other chronic pain: Secondary | ICD-10-CM | POA: Diagnosis not present

## 2022-04-05 MED ORDER — IBUPROFEN 200 MG PO TABS
600.0000 mg | ORAL_TABLET | Freq: Once | ORAL | Status: AC
  Administered 2022-04-05: 600 mg via ORAL
  Filled 2022-04-05: qty 3

## 2022-04-05 MED ORDER — LIDOCAINE 5 % EX PTCH
1.0000 | MEDICATED_PATCH | Freq: Once | CUTANEOUS | Status: DC
Start: 2022-04-05 — End: 2022-04-05
  Administered 2022-04-05: 1 via TRANSDERMAL
  Filled 2022-04-05: qty 1

## 2022-04-05 NOTE — ED Triage Notes (Signed)
Pt states that she was seen at another facility for neck pain radiating down her R arm approximately two weeks ago. Pt was given methocarbamol and prednisone. Pt states that on Thursday pain intensified without any injury to the area.

## 2022-04-05 NOTE — ED Notes (Signed)
Warm pack applied to rt neck

## 2022-04-05 NOTE — ED Provider Notes (Signed)
[place] DEPT Provider Note   CSN: [id number] Arrival date & time: 04/05/22  1538     History  Chief Complaint  Patient presents with   Neck Pain    Sharon Johnson is a 46 y.o. female who presents to the ED complaining of right-sided neck pain that has been ongoing for a while.  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  Has been evaluated multiple times since November 2022 with her primary care provider for similar concerns.  Has not been evaluated by orthopedic specialist.  Notes that her pain today is the same pain that she has been dealing with since November 2022.  Denies numbness, tingling, weakness.   Per pt chart review: pt has been evaluated by her primary care provider multiple times for similar concerns since November 2022. Pt had a negative neck xray with her PCP.  The history is provided by the patient. No language interpreter was used.       Home Medications Prior to Admission medications   Medication Sig Start Date End Date Taking? Authorizing Provider  amLODipine (NORVASC) 5 MG tablet Take 1 tablet (5 mg total) by mouth daily. 03/06/22   Gildardo Pounds, NP  atorvastatin (LIPITOR) 20 MG tablet Take 1 tablet (20 mg total) by mouth daily. 03/06/22   Gildardo Pounds, NP  cyclobenzaprine (FLEXERIL) 5 MG tablet Take 1 to 2 tablets by mouth at bedtime as needed for muscle spasm 03/06/22   Gildardo Pounds, NP  diclofenac Sodium (VOLTAREN) 1 % GEL Apply 4 g topically 4 (four) times daily. 03/06/22 04/10/22  Gildardo Pounds, NP  ibuprofen (ADVIL) 800 MG tablet Take 1 tablet (800 mg total) by mouth every 8 (eight) hours as needed. 11/28/21   Gildardo Pounds, NP  topiramate (TOPAMAX) 25 MG tablet Take 1-2 tablets (25-50 mg total) by mouth daily. Start with 1/2 tablet for 2 weeks and if no relief may take 1 tablet daily 03/06/22   Gildardo Pounds, NP      Allergies    Patient has no known allergies.    Review of  Systems   Review of Systems  Musculoskeletal:  Positive for neck pain. Negative for back pain.  Skin:  Negative for color change and wound.  Neurological:  Negative for weakness and numbness.       -tingling   All other systems reviewed and are negative.   Physical Exam Updated Vital Signs BP 136/82   Pulse 74   Temp 98.5 F (36.9 C) (Oral)   Resp 16   Ht 5\' 2"  (1.575 m)   Wt 81.6 kg   SpO2 100%   BMI 32.92 kg/m  Physical Exam Vitals and nursing note reviewed.  Constitutional:      General: She is not in acute distress.    Appearance: She is not diaphoretic.  HENT:     Head: Normocephalic and atraumatic.     Mouth/Throat:     Pharynx: No oropharyngeal exudate.  Eyes:     General: No scleral icterus.    Conjunctiva/sclera: Conjunctivae normal.  Cardiovascular:     Rate and Rhythm: Normal rate and regular rhythm.     Pulses: Normal pulses.     Heart sounds: Normal heart sounds.  Pulmonary:     Effort: Pulmonary effort is normal. No respiratory distress.     Breath sounds: Normal breath sounds. No wheezing.  Abdominal:     General: Bowel sounds are normal.  Palpations: Abdomen is soft. There is no mass.     Tenderness: There is no abdominal tenderness. There is no guarding or rebound.  Musculoskeletal:        General: Normal range of motion.     Cervical back: Normal range of motion and neck supple.     Comments: No focal neurological deficit.  Patient able to ambulate without assistance or difficulty.  Mild tenderness to palpation noted to right trapezius region and right lateral neck.  No overlying skin changes.  Full active range of motion of bilateral upper extremities.  Grip strength 5/5 bilaterally.  Skin:    General: Skin is warm and dry.  Neurological:     Mental Status: She is alert.  Psychiatric:        Behavior: Behavior normal.     ED Results / Procedures / Treatments   Labs (all labs ordered are listed, but only abnormal results are  displayed) Labs Reviewed - No data to display  EKG None  Radiology DG Cervical Spine Complete  Result Date: 04/05/2022 CLINICAL DATA:  Neck pain for years radiating down the right arm. EXAM: CERVICAL SPINE - COMPLETE 4+ VIEW COMPARISON:  11/28/2021. FINDINGS: There is no evidence of cervical spine fracture or prevertebral soft tissue swelling. Alignment is normal. No other significant bone abnormalities are identified. IMPRESSION: Negative cervical spine radiographs. Electronically Signed   By: Amie Portland M.D.   On: 04/05/2022 16:31    Procedures Procedures    Medications Ordered in ED Medications  lidocaine (LIDODERM) 5 % 1 patch (1 patch Transdermal Patch Applied 04/05/22 1622)  ibuprofen (ADVIL) tablet 600 mg (600 mg Oral Given 04/05/22 1621)    ED Course/ Medical Decision Making/ A&P Clinical Course as of 04/05/22 2140  Sun Apr 05, 2022  1656 Pt re-evaluated and noted improvement of symptoms with treatment regimen in the ED. Discussed with patient and daughter discharge treatment plan. Answered all available questions. Pt appears safe for discharge at this time. [SB]    Clinical Course User Index [SB] Stephanne Greeley A, PA-C                           Medical Decision Making Amount and/or Complexity of Data Reviewed Radiology: ordered.  Risk OTC drugs. Prescription drug management.   Pt presents with concerns for chronic neck pain onset PTA. Vital signs, pt afebrile. On exam, pt with no focal neurological deficit.  Patient able to ambulate without assistance or difficulty.  Mild tenderness to palpation noted to right trapezius region and right lateral neck.  No overlying skin changes.  Full active range of motion of bilateral upper extremities.  Grip strength 5/5 bilaterally. No acute cardiovascular, respiratory exam findings. Differential diagnosis includes fracture, herniation, muscle spasm.    Additional history obtained:  Additional history obtained from  Daughter/Son External records from outside source obtained and reviewed including: pt has been evaluated by her primary care provider multiple times for similar concerns since November 2022. Pt had a negative neck xray with her PCP.  Imaging: I ordered imaging studies including  cervical X-ray I independently visualized and interpreted imaging which showed: negative for acute fracture I agree with the radiologist interpretation  Medications:  I ordered medication including Ibuprofen and Lidoderm patch for pain management Reevaluation of the patient after these medicines and interventions, I reevaluated the patient and found that they have improved I have reviewed the patients home medicines and have made adjustments as needed  Disposition: Presentation suspicious for chronic right sided upper back pain, likely muscle spasm. Doubt fracture or herniation at this time. After consideration of the diagnostic results and the patients response to treatment, I feel that the patient would benefit from Discharge home. Pt provided with information for on-call orthopedist to set up follow up appointment. Work note provided today. Supportive care measures and strict return precautions discussed with patient at bedside. Pt acknowledges and verbalizes understanding. Pt appears safe for discharge. Follow up as indicated in discharge paperwork.    This chart was dictated using voice recognition software, Dragon. Despite the best efforts of this provider to proofread and correct errors, errors may still occur which can change documentation meaning.  Final Clinical Impression(s) / ED Diagnoses Final diagnoses:  Chronic right-sided thoracic back pain    Rx / DC Orders ED Discharge Orders     None         Nicle Connole A, PA-C 04/05/22 2149    Bethann Berkshire, MD 04/11/22 1151

## 2022-04-05 NOTE — Discharge Instructions (Addendum)
It was a pleasure taking care of you today!   Your xray didn't show any concerning findings at this time. You may take over the counter 600 mg ibuprofen every 6 hours and alternate with 500 mg tylenol every 6 hours. You may place a warm compress to the area for up to 15 minutes at a time, ensure to place a barrier between your skin and the heat. Call your primary care provider to set up a follow up appointment. Attached is information for the on-call orthopedist, call and set up a follow up appointment regarding todays ED visit. Return to the ED if you are experiencing increasing/worsening symptoms.   Fue un placer cuidarte hoy!  Su radiografa no mostr Engineer, manufacturing. Puede tomar 600 mg de ibuprofeno de venta libre cada 6 horas y Counsellor con 500 mg de tylenol cada 6 horas. Puede colocar una compresa tibia en el rea por hasta 15 minutos seguidos, asegrese de Engineer, production barrera entre su piel y Nurse, children's. Llame a su proveedor de atencin primaria para programar una cita de seguimiento. Se adjunta informacin para el ortopedista de [place], llame y programe una cita de seguimiento con respecto a la visita al [employer] de Emergencias de hoy. Regrese al servicio de urgencias si experimenta sntomas que aumentan o empeoran.

## 2022-04-05 NOTE — ED Provider Triage Note (Signed)
Emergency Medicine Provider Triage Evaluation Note  Sharon Johnson , a 46 y.o. female  was evaluated in triage.  Pt complains of right-sided neck pain that has been ongoing for a while.  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  Has been evaluated multiple times since November 2022 with her primary care provider for similar concerns.  Has not been evaluated by orthopedic specialist.  Notes that her pain today is the same pain that she has been dealing with since November 2022.  Denies numbness, tingling, weakness.  Review of Systems  Positive:  Negative:   Physical Exam  BP (!) 160/86 (BP Location: Right Arm)   Pulse 91   Temp 98.6 F (37 C)   Resp 16   Ht 5\' 2"  (1.575 m)   Wt 81.6 kg   SpO2 96%   BMI 32.92 kg/m  Gen:   Awake, no distress   Resp:  Normal effort  MSK:   Moves extremities without difficulty  Other:  No focal neurological deficit.  Patient able to ambulate without assistance or difficulty.  Mild tenderness to palpation noted to right trapezius region and right lateral neck.  No overlying skin changes.  Full active range of motion of bilateral upper extremities.  Grip strength 5/5 bilaterally.  Medical Decision Making  Medically screening exam initiated at 4:01 PM.  Appropriate orders placed.  Sharon Johnson was informed that the remainder of the evaluation will be completed by another provider, this initial triage assessment does not replace that evaluation, and the importance of remaining in the ED until their evaluation is complete.  Work-up initiated   Wyvonne Carda A, PA-C 04/05/22 1601

## 2022-04-06 ENCOUNTER — Other Ambulatory Visit (HOSPITAL_COMMUNITY): Payer: Self-pay

## 2022-04-24 ENCOUNTER — Ambulatory Visit
Admission: RE | Admit: 2022-04-24 | Discharge: 2022-04-24 | Disposition: A | Discharge status: Home / Self Care | Payer: Commercial Managed Care - HMO | Source: Ambulatory Visit | Attending: Nurse Practitioner | Admitting: Nurse Practitioner

## 2022-04-24 DIAGNOSIS — Z1231 Encounter for screening mammogram for malignant neoplasm of breast: Secondary | ICD-10-CM

## 2022-05-08 ENCOUNTER — Other Ambulatory Visit (HOSPITAL_COMMUNITY): Payer: Self-pay

## 2022-05-15 ENCOUNTER — Other Ambulatory Visit (HOSPITAL_COMMUNITY)
Admission: RE | Admit: 2022-05-15 | Discharge: 2022-05-15 | Disposition: A | Discharge status: Home / Self Care | Payer: Commercial Managed Care - HMO | Source: Ambulatory Visit | Attending: Nurse Practitioner | Admitting: Nurse Practitioner

## 2022-05-15 ENCOUNTER — Other Ambulatory Visit: Payer: Self-pay

## 2022-05-15 ENCOUNTER — Encounter: Payer: Self-pay | Admitting: Nurse Practitioner

## 2022-05-15 ENCOUNTER — Ambulatory Visit: Payer: Commercial Managed Care - HMO | Attending: Nurse Practitioner | Admitting: Nurse Practitioner

## 2022-05-15 VITALS — BP 132/85 | HR 67 | Temp 98.0°F | Ht 62.0 in | Wt 182.4 lb

## 2022-05-15 DIAGNOSIS — Z124 Encounter for screening for malignant neoplasm of cervix: Secondary | ICD-10-CM

## 2022-05-15 DIAGNOSIS — I10 Essential (primary) hypertension: Secondary | ICD-10-CM

## 2022-05-15 DIAGNOSIS — M5412 Radiculopathy, cervical region: Secondary | ICD-10-CM

## 2022-05-15 DIAGNOSIS — M791 Myalgia, unspecified site: Secondary | ICD-10-CM

## 2022-05-15 DIAGNOSIS — E785 Hyperlipidemia, unspecified: Secondary | ICD-10-CM | POA: Diagnosis not present

## 2022-05-15 DIAGNOSIS — G43909 Migraine, unspecified, not intractable, without status migrainosus: Secondary | ICD-10-CM

## 2022-05-15 MED ORDER — TOPIRAMATE 25 MG PO TABS
25.0000 mg | ORAL_TABLET | Freq: Every day | ORAL | 1 refills | Status: DC
  Filled 2022-05-15 – 2022-07-01 (×2): qty 90, 45d supply, fill #0

## 2022-05-15 MED ORDER — CYCLOBENZAPRINE HCL 5 MG PO TABS
ORAL_TABLET | ORAL | 3 refills | Status: AC
  Filled 2022-05-15: qty 60, fill #0
  Filled 2022-07-01: qty 60, 30d supply, fill #0
  Filled 2022-07-27: qty 60, 30d supply, fill #1
  Filled 2022-09-25: qty 60, 30d supply, fill #2
  Filled 2022-10-23: qty 60, 30d supply, fill #3

## 2022-05-15 MED ORDER — ATORVASTATIN CALCIUM 20 MG PO TABS
20.0000 mg | ORAL_TABLET | Freq: Every day | ORAL | 3 refills | Status: AC
  Filled 2022-05-15 – 2022-07-01 (×2): qty 90, 90d supply, fill #0
  Filled 2022-09-25: qty 90, 90d supply, fill #1

## 2022-05-15 MED ORDER — IBUPROFEN 800 MG PO TABS
800.0000 mg | ORAL_TABLET | Freq: Three times a day (TID) | ORAL | 3 refills | Status: AC | PRN
  Filled 2022-05-15: qty 60, 20d supply, fill #0
  Filled 2022-07-01: qty 60, 20d supply, fill #1
  Filled 2022-09-25: qty 60, 20d supply, fill #2

## 2022-05-15 MED ORDER — AMLODIPINE BESYLATE 5 MG PO TABS
5.0000 mg | ORAL_TABLET | Freq: Every day | ORAL | 1 refills | Status: DC
  Filled 2022-05-15 – 2022-07-01 (×2): qty 90, 90d supply, fill #0
  Filled 2022-07-27: qty 90, 90d supply, fill #1

## 2022-05-15 NOTE — Progress Notes (Signed)
Assessment & Plan:  Sharon Johnson was seen today for gynecologic exam.  Diagnoses and all orders for this visit:  Encounter for Papanicolaou smear for cervical cancer screening -     Cytology - PAP -     Cervicovaginal ancillary only  Primary hypertension -     CMP14+EGFR -     amLODipine (NORVASC) 5 MG tablet; Take 1 tablet (5 mg total) by mouth daily.  Dyslipidemia, goal LDL below 100 -     atorvastatin (LIPITOR) 20 MG tablet; Take 1 tablet (20 mg total) by mouth daily.  Myalgia -     cyclobenzaprine (FLEXERIL) 5 MG tablet; Take 1 to 2 tablets by mouth at bedtime as needed for muscle spasm  Cervical radiculopathy -     ibuprofen (ADVIL) 800 MG tablet; Take 1 tablet (800 mg total) by mouth every 8 (eight) hours as needed.  Migraine without status migrainosus, not intractable, unspecified migraine type -     ibuprofen (ADVIL) 800 MG tablet; Take 1 tablet (800 mg total) by mouth every 8 (eight) hours as needed. -     topiramate (TOPAMAX) 25 MG tablet; Take 1-2 tablets (25-50 mg total) by mouth daily. Start with 1/2 tablet for 2 weeks and if no relief may take 1 tablet daily    Patient has been counseled on age-appropriate routine health concerns for screening and prevention. These are reviewed and up-to-date. Referrals have been placed accordingly. Immunizations are up-to-date or declined.    Subjective:   Chief Complaint  Patient presents with   Gynecologic Exam   HPI Sharon Johnson 46 y.o. female presents to office today for PAP smear.   BP Readings from Last 3 Encounters:  05/15/22 132/85  04/05/22 136/82  03/06/22 123/82      Review of Systems  Constitutional: Negative.  Negative for chills, fever, malaise/fatigue and weight loss.  Respiratory: Negative.  Negative for cough, shortness of breath and wheezing.   Cardiovascular: Negative.  Negative for chest pain, orthopnea and leg swelling.  Gastrointestinal:  Negative for abdominal pain.  Genitourinary:  Negative.  Negative for flank pain.  Skin: Negative.  Negative for rash.  Psychiatric/Behavioral:  Negative for suicidal ideas.     Past Medical History:  Diagnosis Date   Hypertension    Migraine     Past Surgical History:  Procedure Laterality Date   CHOLECYSTECTOMY N/A 11/10/2017   Procedure: LAPAROSCOPIC CHOLECYSTECTOMY WITH INTRAOPERATIVE CHOLANGIOGRAM;  Surgeon: Johnathan Hausen, MD;  Location: [facility];  Service: General;  Laterality: N/A;    Family History  Problem Relation Age of Onset   Hypertension Mother    Breast cancer Sister     Social History Reviewed with no changes to be made today.   Outpatient Medications Prior to Visit  Medication Sig Dispense Refill   amLODipine (NORVASC) 5 MG tablet Take 1 tablet (5 mg total) by mouth daily. 90 tablet 1   atorvastatin (LIPITOR) 20 MG tablet Take 1 tablet (20 mg total) by mouth daily. 90 tablet 3   cyclobenzaprine (FLEXERIL) 5 MG tablet Take 1 to 2 tablets by mouth at bedtime as needed for muscle spasm 60 tablet 3   ibuprofen (ADVIL) 800 MG tablet Take 1 tablet (800 mg total) by mouth every 8 (eight) hours as needed. 60 tablet 3   topiramate (TOPAMAX) 25 MG tablet Take 1-2 tablets (25-50 mg total) by mouth daily. Start with 1/2 tablet for 2 weeks and if no relief may take 1 tablet daily 90 tablet 1  No facility-administered medications prior to visit.    No Known Allergies     Objective:    BP 132/85   Pulse 67   Temp 98 F (36.7 C) (Temporal)   Ht _0  (1.575 m)   Wt 182 lb 6.4 oz (82.7 kg)   SpO2 98%   BMI 33.36 kg/m  Wt Readings from Last 3 Encounters:  05/15/22 182 lb 6.4 oz (82.7 kg)  04/05/22 180 lb (81.6 kg)  03/06/22 180 lb 3.2 oz (81.7 kg)    Physical Exam Exam conducted with a chaperone present.  Constitutional:      Appearance: She is well-developed.  HENT:     Head: Normocephalic.  Cardiovascular:     Rate and Rhythm: Normal rate and regular rhythm.     Heart sounds: Normal heart sounds.   Pulmonary:     Effort: Pulmonary effort is normal.     Breath sounds: Normal breath sounds.  Abdominal:     General: Bowel sounds are normal.     Palpations: Abdomen is soft.     Hernia: There is no hernia in the left inguinal area.  Genitourinary:    Exam position: Lithotomy position.     Labia:        Right: No rash, tenderness, lesion or injury.        Left: No rash, tenderness, lesion or injury.      Vagina: Normal. No signs of injury and foreign body. No vaginal discharge, erythema, tenderness or bleeding.     Cervix: Normal.     Uterus: Normal. Not deviated and not enlarged.      Adnexa: Right adnexa normal and left adnexa normal.       Right: No mass, tenderness or fullness.         Left: No mass, tenderness or fullness.       Rectum: Normal. No external hemorrhoid.     Comments: IUD strings intact Lymphadenopathy:     Lower Body: No right inguinal adenopathy. No left inguinal adenopathy.  Skin:    General: Skin is warm and dry.  Neurological:     Mental Status: She is alert and oriented to person, place, and time.  Psychiatric:        Behavior: Behavior normal.        Thought Content: Thought content normal.        Judgment: Judgment normal.          Patient has been counseled extensively about nutrition and exercise as well as the importance of adherence with medications and regular follow-up. The patient was given clear instructions to go to ER or return to medical center if symptoms don't improve, worsen or new problems develop. The patient verbalized understanding.   Follow-up: Return for HTN 3 months.   Gildardo Pounds, FNP-BC [facility] and [place], [place]   05/15/2022, 8:20 PM

## 2022-05-16 LAB — CMP14+EGFR
ALT: 16 IU/L (ref 0–32)
AST: 15 IU/L (ref 0–40)
Albumin/Globulin Ratio: 2 (ref 1.2–2.2)
Albumin: 4.7 g/dL (ref 3.9–4.9)
Alkaline Phosphatase: 67 IU/L (ref 44–121)
BUN/Creatinine Ratio: 20 (ref 9–23)
BUN: 18 mg/dL (ref 6–24)
Bilirubin Total: 0.2 mg/dL (ref 0.0–1.2)
CO2: 25 mmol/L (ref 20–29)
Calcium: 9.7 mg/dL (ref 8.7–10.2)
Chloride: 100 mmol/L (ref 96–106)
Creatinine, Ser: 0.88 mg/dL (ref 0.57–1.00)
Globulin, Total: 2.4 g/dL (ref 1.5–4.5)
Glucose: 72 mg/dL (ref 70–99)
Potassium: 4.1 mmol/L (ref 3.5–5.2)
Sodium: 140 mmol/L (ref 134–144)
Total Protein: 7.1 g/dL (ref 6.0–8.5)
eGFR: 82 mL/min/{1.73_m2} (ref >59)

## 2022-05-18 LAB — CERVICOVAGINAL ANCILLARY ONLY
Bacterial Vaginitis (gardnerella): NEGATIVE
Candida Glabrata: NEGATIVE
Candida Vaginitis: NEGATIVE
Chlamydia: NEGATIVE
Comment: NEGATIVE
Comment: NEGATIVE
Comment: NEGATIVE
Comment: NEGATIVE
Comment: NEGATIVE
Comment: NORMAL
Neisseria Gonorrhea: NEGATIVE
Trichomonas: NEGATIVE

## 2022-05-21 LAB — CYTOLOGY - PAP
Comment: NEGATIVE
Diagnosis: NEGATIVE
High risk HPV: NEGATIVE

## 2022-07-02 ENCOUNTER — Other Ambulatory Visit: Payer: Self-pay

## 2022-07-27 ENCOUNTER — Other Ambulatory Visit: Payer: Self-pay

## 2022-07-29 ENCOUNTER — Other Ambulatory Visit (HOSPITAL_COMMUNITY): Payer: Self-pay

## 2022-07-29 ENCOUNTER — Other Ambulatory Visit: Payer: Self-pay

## 2022-08-21 ENCOUNTER — Ambulatory Visit: Payer: BLUE CROSS/BLUE SHIELD | Attending: Nurse Practitioner | Admitting: Nurse Practitioner

## 2022-08-21 ENCOUNTER — Other Ambulatory Visit: Payer: Self-pay

## 2022-08-21 ENCOUNTER — Encounter: Payer: Self-pay | Admitting: Nurse Practitioner

## 2022-08-21 DIAGNOSIS — I10 Essential (primary) hypertension: Secondary | ICD-10-CM | POA: Diagnosis not present

## 2022-08-21 DIAGNOSIS — G43909 Migraine, unspecified, not intractable, without status migrainosus: Secondary | ICD-10-CM | POA: Diagnosis not present

## 2022-08-21 MED ORDER — TOPIRAMATE 25 MG PO TABS: 25.0000 mg | ORAL_TABLET | Freq: Every day | ORAL | 1 refills | Status: AC

## 2022-08-21 MED ORDER — AMLODIPINE BESYLATE 10 MG PO TABS
10.0000 mg | ORAL_TABLET | Freq: Every day | ORAL | 1 refills | Status: AC
  Filled 2022-08-21: qty 90, 90d supply, fill #0
  Filled 2022-09-25 – 2022-11-22 (×2): qty 90, 90d supply, fill #1

## 2022-08-21 NOTE — Progress Notes (Signed)
Assessment & Plan:  Sharon Johnson was seen today for hypertension.  Diagnoses and all orders for this visit:  Primary hypertension Increased amlodipine from 5 to 10 mg -     amLODipine (NORVASC) 10 MG tablet; Take 1 tablet (10 mg total) by mouth daily.  Migraine without status migrainosus, not intractable, unspecified migraine type Instructed to take topamax every day and not as needed.  -     topiramate (TOPAMAX) 25 MG tablet; Take 1-2 tablets (25-50 mg total) by mouth daily.    Patient has been counseled on age-appropriate routine health concerns for screening and prevention. These are reviewed and up-to-date. Referrals have been placed accordingly. Immunizations are up-to-date or declined.    Subjective:   Chief Complaint  Patient presents with   Hypertension   HPI Sharon Johnson 47 y.o. female presents to office today for HTN   Blood pressure is not well controlled. Will increase amlodipine to 10 mg daily.  BP Readings from Last 3 Encounters:  08/21/22 (!) 149/82  05/15/22 132/85  04/05/22 136/82   Migraine headaches Has not been taking topamax every day as prescribed. States taking the medication helps sometimes and she has only been taking it when she has a headache. Which are occurring weekly.     Feels ears are blocked. Also sometimes when she wakes up and blows her nose there is blood streaked nasal drainage but no blood that drains freely from the nose.   Review of Systems  Constitutional:  Negative for fever, malaise/fatigue and weight loss.  HENT: Negative.  Negative for nosebleeds.   Eyes: Negative.  Negative for blurred vision, double vision and photophobia.  Respiratory: Negative.  Negative for cough and shortness of breath.   Cardiovascular: Negative.  Negative for chest pain, palpitations and leg swelling.  Gastrointestinal: Negative.  Negative for heartburn, nausea and vomiting.  Musculoskeletal: Negative.  Negative for myalgias.  Neurological:   Positive for headaches. Negative for dizziness, focal weakness and seizures.  Psychiatric/Behavioral: Negative.  Negative for suicidal ideas.     Past Medical History:  Diagnosis Date   Hypertension    Migraine     Past Surgical History:  Procedure Laterality Date   CHOLECYSTECTOMY N/A 11/10/2017   Procedure: LAPAROSCOPIC CHOLECYSTECTOMY WITH INTRAOPERATIVE CHOLANGIOGRAM;  Surgeon: Johnathan Hausen, MD;  Location: [facility];  Service: General;  Laterality: N/A;    Family History  Problem Relation Age of Onset   Hypertension Mother    Breast cancer Sister     Social History Reviewed with no changes to be made today.   Outpatient Medications Prior to Visit  Medication Sig Dispense Refill   atorvastatin (LIPITOR) 20 MG tablet Take 1 tablet (20 mg total) by mouth daily. 90 tablet 3   cyclobenzaprine (FLEXERIL) 5 MG tablet Take 1 to 2 tablets by mouth at bedtime as needed for muscle spasm 60 tablet 3   ibuprofen (ADVIL) 800 MG tablet Take 1 tablet (800 mg total) by mouth every 8 (eight) hours as needed. 60 tablet 3   amLODipine (NORVASC) 5 MG tablet Take 1 tablet (5 mg total) by mouth daily. 90 tablet 1   topiramate (TOPAMAX) 25 MG tablet Take 1-2 tablets (25-50 mg total) by mouth daily. Start with 1/2 tablet for 2 weeks and if no relief may take 1 tablet daily 90 tablet 1   No facility-administered medications prior to visit.    No Known Allergies     Objective:    BP (!) 149/82   Pulse 67  Resp (!) 98   Ht 5\' 2"  (1.575 m)   Wt 180 lb (81.6 kg)   BMI 32.92 kg/m  Wt Readings from Last 3 Encounters:  08/21/22 180 lb (81.6 kg)  05/15/22 182 lb 6.4 oz (82.7 kg)  04/05/22 180 lb (81.6 kg)    Physical Exam Vitals and nursing note reviewed.  Constitutional:      Appearance: She is well-developed.  HENT:     Head: Normocephalic and atraumatic.  Cardiovascular:     Rate and Rhythm: Normal rate and regular rhythm.     Heart sounds: Normal heart sounds. No murmur heard.     No friction rub. No gallop.  Pulmonary:     Effort: Pulmonary effort is normal. No tachypnea or respiratory distress.     Breath sounds: Normal breath sounds. No decreased breath sounds, wheezing, rhonchi or rales.  Chest:     Chest wall: No tenderness.  Abdominal:     General: Bowel sounds are normal.     Palpations: Abdomen is soft.  Musculoskeletal:        General: Normal range of motion.     Cervical back: Normal range of motion.  Skin:    General: Skin is warm and dry.  Neurological:     Mental Status: She is alert and oriented to person, place, and time.     Coordination: Coordination normal.  Psychiatric:        Behavior: Behavior normal. Behavior is cooperative.        Thought Content: Thought content normal.        Judgment: Judgment normal.          Patient has been counseled extensively about nutrition and exercise as well as the importance of adherence with medications and regular follow-up. The patient was given clear instructions to go to ER or return to medical center if symptoms don't improve, worsen or new problems develop. The patient verbalized understanding.   Follow-up: Return in about 4 weeks (around 09/18/2022) for BP Check with me luke or angela.   Gildardo Pounds, FNP-BC [facility] and [place], [place]   08/21/2022, 6:10 PM

## 2022-09-23 ENCOUNTER — Ambulatory Visit: Payer: BLUE CROSS/BLUE SHIELD | Admitting: Physician Assistant

## 2022-09-26 ENCOUNTER — Other Ambulatory Visit (HOSPITAL_COMMUNITY): Payer: Self-pay

## 2022-09-28 ENCOUNTER — Other Ambulatory Visit: Payer: Self-pay

## 2022-10-16 IMAGING — CR DG CERVICAL SPINE COMPLETE 4+V
5 series · 5 of 5 positions shown · non-contrast
Comparison: None Available.

CLINICAL DATA: Chronic neck pain radiating down right shoulder x 3
years.

EXAM:
CERVICAL SPINE - COMPLETE 4+ VIEW

[w c-spine lat]
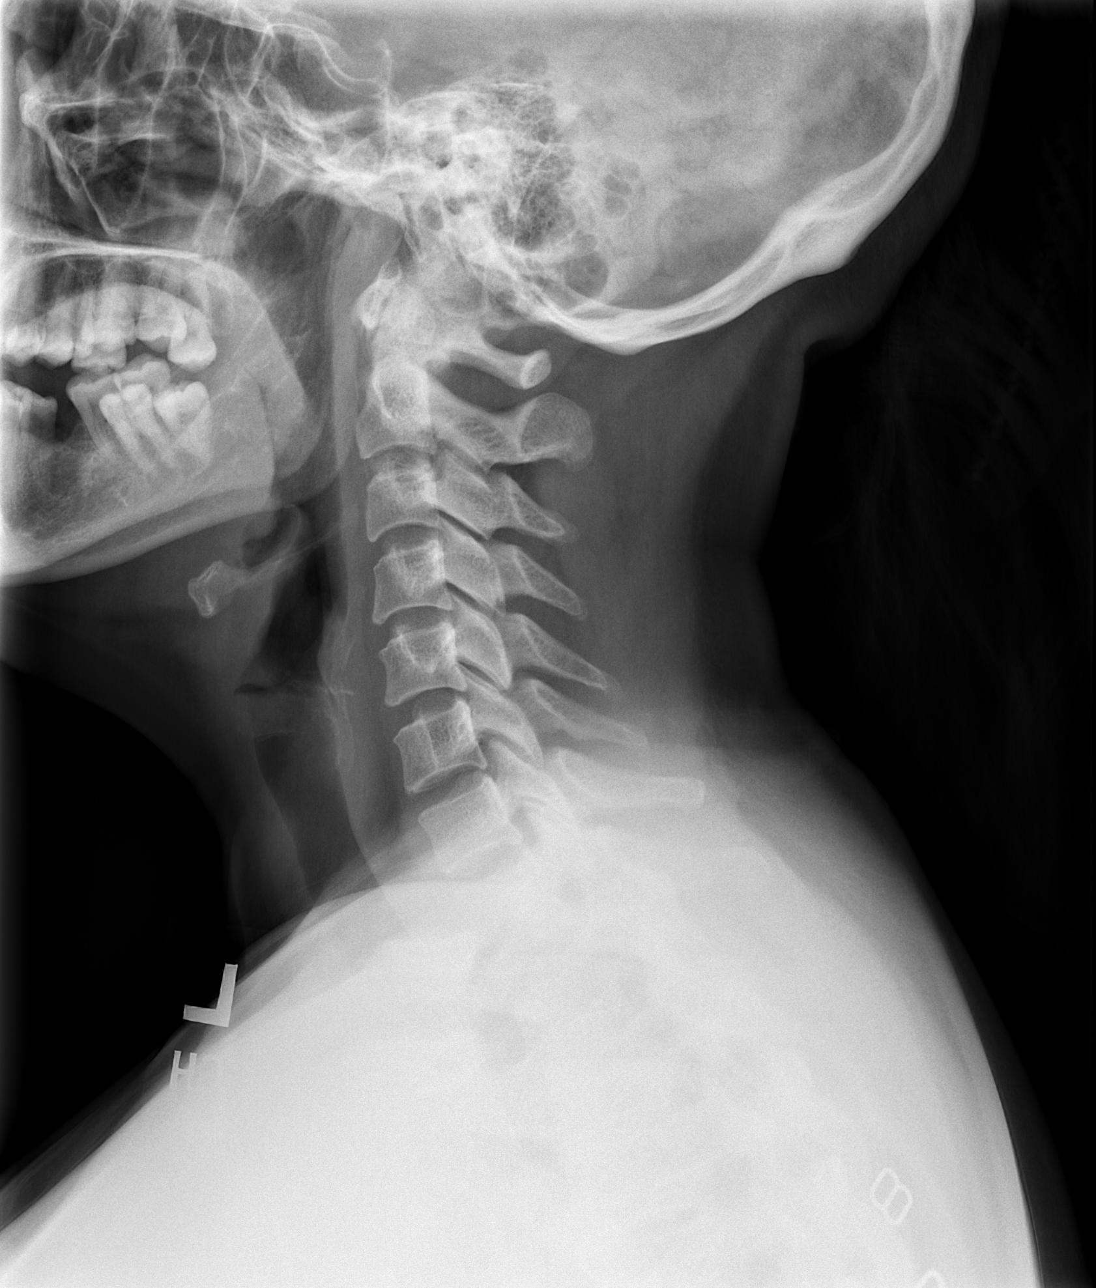

[w c-spine oblique (1 of 2)]
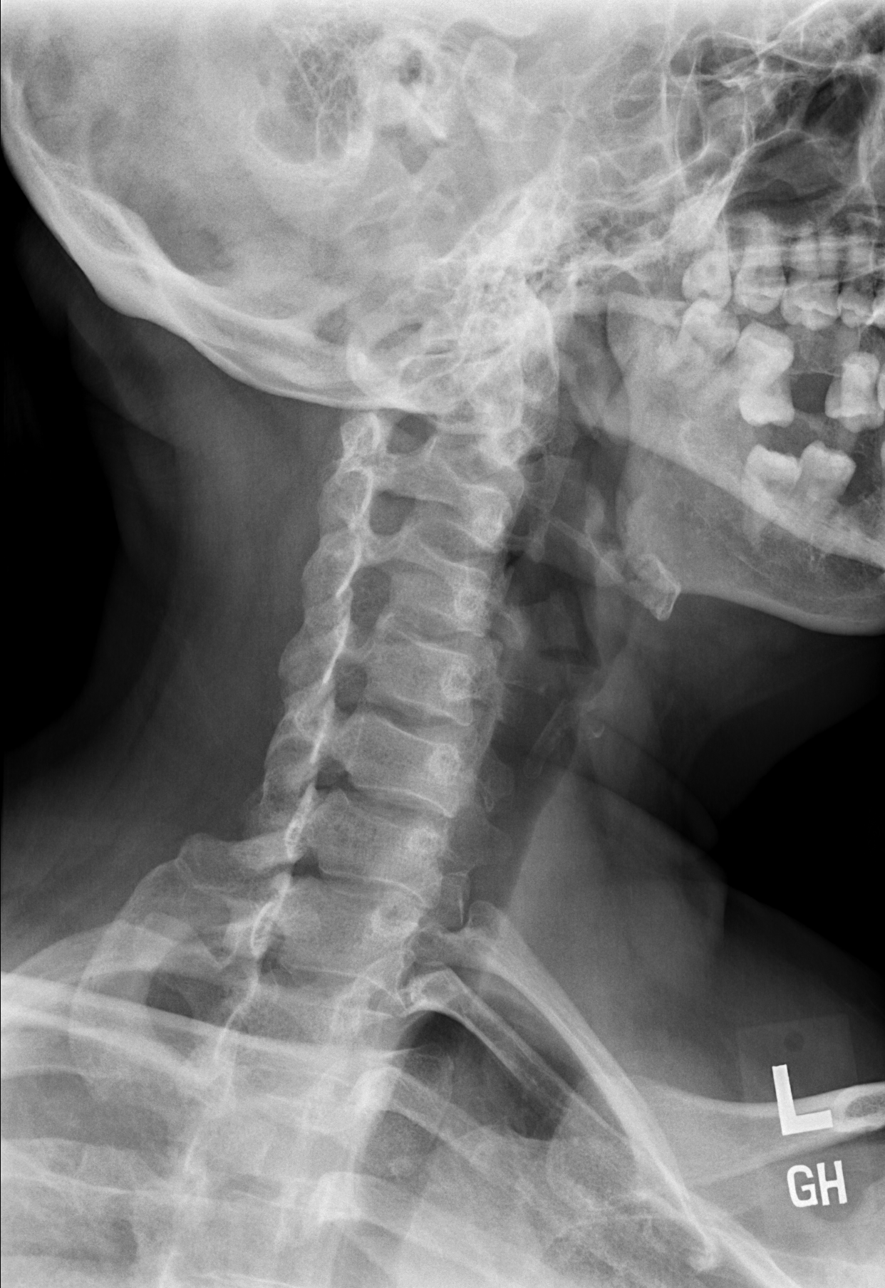

[w c-spine oblique (2 of 2)]
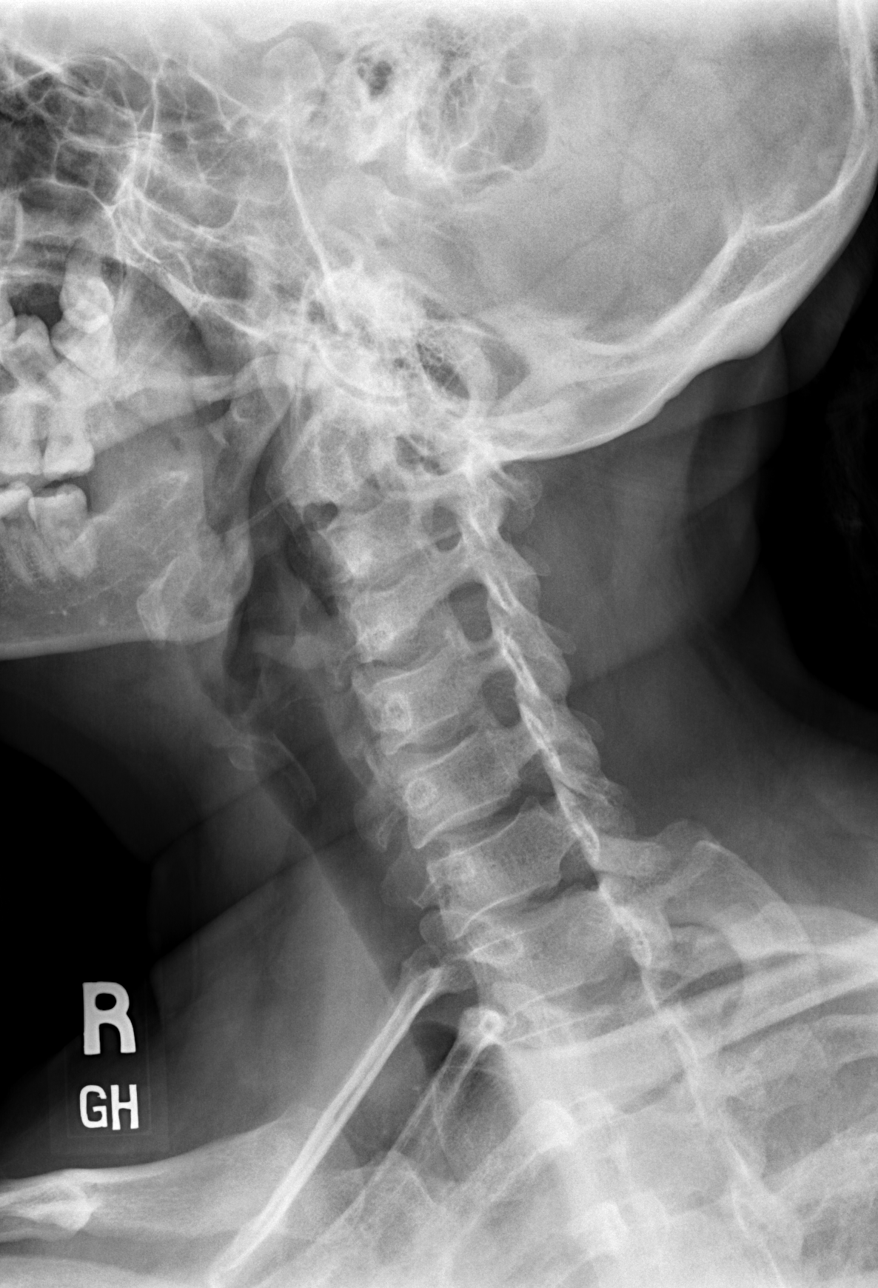

[w c-spine a.p. *]
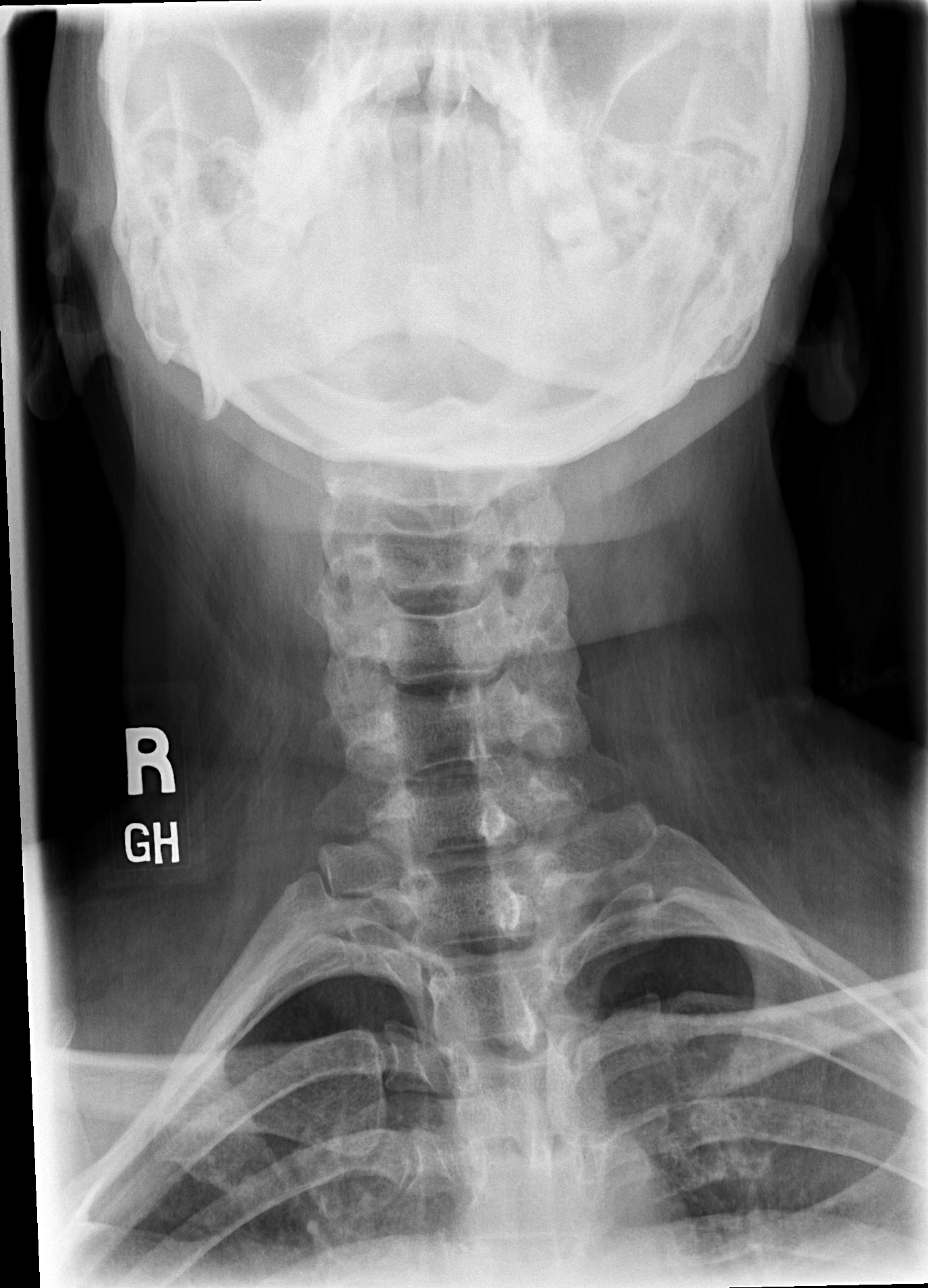

[w c-spine odontoid *]
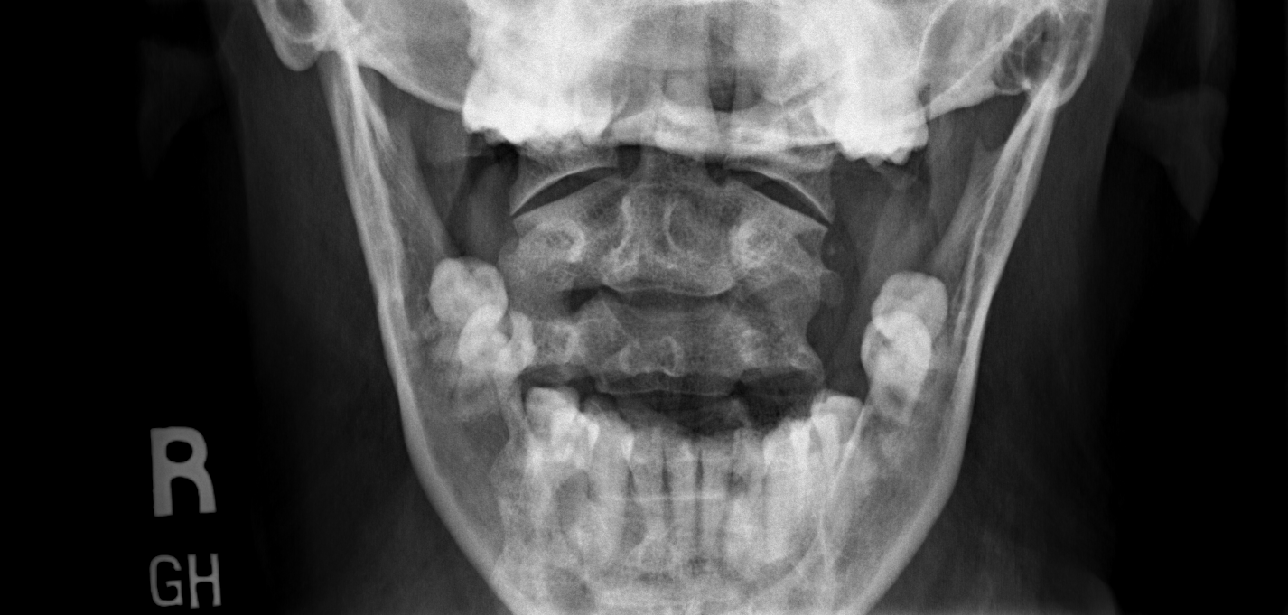

[5 of 5 positions shown; findings below may reference images not displayed]

FINDINGS: There is no evidence of cervical spine fracture or prevertebral soft
tissue swelling. Alignment is normal. No other significant bone
abnormalities are identified.
IMPRESSION: Negative cervical spine radiographs.

## 2023-08-23 ENCOUNTER — Other Ambulatory Visit: Payer: Self-pay | Admitting: Nurse Practitioner

## 2023-08-23 DIAGNOSIS — Z1231 Encounter for screening mammogram for malignant neoplasm of breast: Secondary | ICD-10-CM

## 2023-08-31 ENCOUNTER — Ambulatory Visit
Admission: RE | Admit: 2023-08-31 | Discharge: 2023-08-31 | Disposition: A | Discharge status: Home / Self Care | Payer: Medicaid Other | Source: Ambulatory Visit

## 2023-08-31 DIAGNOSIS — Z1231 Encounter for screening mammogram for malignant neoplasm of breast: Secondary | ICD-10-CM

## 2023-09-05 ENCOUNTER — Encounter: Payer: Self-pay | Admitting: Nurse Practitioner

## 2023-12-09 ENCOUNTER — Emergency Department (HOSPITAL_COMMUNITY)

## 2023-12-09 ENCOUNTER — Emergency Department (HOSPITAL_COMMUNITY)
Admission: EM | Admit: 2023-12-09 | Discharge: 2023-12-09 | Disposition: A | Discharge status: Home / Self Care | Attending: Emergency Medicine | Admitting: Emergency Medicine

## 2023-12-09 ENCOUNTER — Other Ambulatory Visit: Payer: Self-pay

## 2023-12-09 DIAGNOSIS — Z79899 Other long term (current) drug therapy: Secondary | ICD-10-CM | POA: Insufficient documentation

## 2023-12-09 DIAGNOSIS — I10 Essential (primary) hypertension: Secondary | ICD-10-CM | POA: Diagnosis not present

## 2023-12-09 DIAGNOSIS — R519 Headache, unspecified: Secondary | ICD-10-CM | POA: Insufficient documentation

## 2023-12-09 LAB — CBC WITH DIFFERENTIAL/PLATELET
Abs Immature Granulocytes: 0.05 10*3/uL (ref 0.00–0.07)
Basophils Absolute: 0.1 10*3/uL (ref 0.0–0.1)
Basophils Relative: 1 %
Eosinophils Absolute: 0 10*3/uL (ref 0.0–0.5)
Eosinophils Relative: 0 %
HCT: 48.3 % — ABNORMAL HIGH (ref 36.0–46.0)
Hemoglobin: 16.1 g/dL — ABNORMAL HIGH (ref 12.0–15.0)
Immature Granulocytes: 1 %
Lymphocytes Relative: 39 %
Lymphs Abs: 3 10*3/uL (ref 0.7–4.0)
MCH: 29.2 pg (ref 26.0–34.0)
MCHC: 33.3 g/dL (ref 30.0–36.0)
MCV: 87.7 fL (ref 80.0–100.0)
Monocytes Absolute: 0.7 10*3/uL (ref 0.1–1.0)
Monocytes Relative: 9 %
Neutro Abs: 3.8 10*3/uL (ref 1.7–7.7)
Neutrophils Relative %: 50 %
Platelets: 394 10*3/uL (ref 150–400)
RBC: 5.51 MIL/uL — ABNORMAL HIGH (ref 3.87–5.11)
RDW: 13.5 % (ref 11.5–15.5)
WBC: 7.6 10*3/uL (ref 4.0–10.5)
nRBC: 0 % (ref 0.0–0.2)

## 2023-12-09 LAB — BASIC METABOLIC PANEL WITH GFR
Anion gap: 10 (ref 5–15)
BUN: 9 mg/dL (ref 6–20)
CO2: 23 mmol/L (ref 22–32)
Calcium: 9.4 mg/dL (ref 8.9–10.3)
Chloride: 106 mmol/L (ref 98–111)
Creatinine, Ser: 0.51 mg/dL (ref 0.44–1.00)
GFR, Estimated: >60 mL/min (ref >60)
Glucose, Bld: 110 mg/dL — ABNORMAL HIGH (ref 70–99)
Potassium: 3.3 mmol/L — ABNORMAL LOW (ref 3.5–5.1)
Sodium: 139 mmol/L (ref 135–145)

## 2023-12-09 LAB — HCG, SERUM, QUALITATIVE: Preg, Serum: NEGATIVE

## 2023-12-09 LAB — TSH: TSH: 1.897 u[IU]/mL (ref 0.350–4.500)

## 2023-12-09 LAB — TROPONIN I (HIGH SENSITIVITY): Troponin I (High Sensitivity): <2 ng/L (ref <18)

## 2023-12-09 MED ORDER — KETOROLAC TROMETHAMINE 15 MG/ML IJ SOLN
15.0000 mg | Freq: Once | INTRAMUSCULAR | Status: AC
  Administered 2023-12-09: 15 mg via INTRAVENOUS
  Filled 2023-12-09: qty 1

## 2023-12-09 MED ORDER — DROPERIDOL 2.5 MG/ML IJ SOLN
1.2500 mg | Freq: Once | INTRAMUSCULAR | Status: AC
  Administered 2023-12-09: 1.25 mg via INTRAVENOUS
  Filled 2023-12-09: qty 2

## 2023-12-09 MED ORDER — LACTATED RINGERS IV BOLUS
1000.0000 mL | Freq: Once | INTRAVENOUS | Status: AC
  Administered 2023-12-09: 1000 mL via INTRAVENOUS

## 2023-12-09 MED ORDER — DIPHENHYDRAMINE HCL 50 MG/ML IJ SOLN: 50.0000 mg | Freq: Once | INTRAMUSCULAR | Status: AC

## 2023-12-09 MED ORDER — DIPHENHYDRAMINE HCL 50 MG/ML IJ SOLN
INTRAMUSCULAR | Status: AC
Start: 2023-12-09 — End: 2023-12-09
  Administered 2023-12-09: 50 mg via INTRAVENOUS
  Filled 2023-12-09: qty 1

## 2023-12-09 MED ORDER — ACETAMINOPHEN 500 MG PO TABS
1000.0000 mg | ORAL_TABLET | Freq: Once | ORAL | Status: AC
Start: 2023-12-09 — End: 2023-12-09
  Administered 2023-12-09: 1000 mg via ORAL
  Filled 2023-12-09: qty 2

## 2023-12-09 MED ORDER — METOCLOPRAMIDE HCL 5 MG/ML IJ SOLN: 10.0000 mg | Freq: Once | INTRAMUSCULAR | Status: DC

## 2023-12-09 MED ORDER — POTASSIUM CHLORIDE CRYS ER 20 MEQ PO TBCR
40.0000 meq | EXTENDED_RELEASE_TABLET | Freq: Once | ORAL | Status: AC
  Administered 2023-12-09: 40 meq via ORAL
  Filled 2023-12-09: qty 2

## 2023-12-09 NOTE — ED Triage Notes (Signed)
 Headache and chest pressure for the past week. Pt feels like it could be anxiety. Pt was seen at [facility] few [facility] for the headache. Pt has been taking her HTN meds as well. Pt states she has been having nausea as well.

## 2023-12-09 NOTE — Discharge Instructions (Signed)
 Mientras estuvo en urgencias, le realizaron una tomografa computarizada de crneo que result normal. Tambin le realizaron una radiografa de trax y un electrocardiograma normales. Recibi un medicamento llamado droperidol, al que, lamentablemente, tuvo una reaccin distnica. He aadido [employer] a su lista de Environmental consultant.  Por favor, consulte con su mdico de cabecera para hablar sobre su presin arterial.

## 2023-12-09 NOTE — ED Notes (Signed)
 Pt had an adverse reaction to droperidol- MD notified

## 2023-12-09 NOTE — ED Provider Notes (Signed)
 [facility] AT [facility] Provider Note  CSN: [id number] Arrival date & time: 12/09/23 1545  Chief Complaint(s) Headache  HPI Sharon Johnson is a 48 y.o. female who is here today for 1 week of headache at the back of her head, chest heaviness, tingling and all of her fingers and the fatigue.  Patient denies any visual disturbances, gait difficulty or weakness.  Patient with past history significant for hypertension.   Past Medical History Past Medical History:  Diagnosis Date   Hypertension    Migraine    Patient Active Problem List   Diagnosis Date Noted   Migraine headache 04/08/2018   Hypertension 04/08/2018   SIRS (systemic inflammatory response syndrome) (HCC) 11/08/2017   Calculus of gallbladder without cholecystitis without obstruction 11/05/2017   Fatty liver 11/05/2017   Nausea and vomiting 10/28/2017   Lower abdominal pain 10/28/2017   Transaminitis 10/28/2017   Hyperbilirubinemia 10/28/2017   Leukocytosis 10/28/2017   Home Medication(s) Prior to Admission medications   Medication Sig Start Date End Date Taking? Authorizing Provider  amLODipine  (NORVASC ) 10 MG tablet Take 1 tablet (10 mg total) by mouth daily. 08/21/22   Fleming, Zelda W, NP  atorvastatin  (LIPITOR) 20 MG tablet Take 1 tablet (20 mg total) by mouth daily. 05/15/22   Fleming, Zelda W, NP  cyclobenzaprine  (FLEXERIL ) 5 MG tablet Take 1 to 2 tablets by mouth at bedtime as needed for muscle spasm 05/15/22   Fleming, Zelda W, NP  ibuprofen  (ADVIL ) 800 MG tablet Take 1 tablet (800 mg total) by mouth every 8 (eight) hours as needed. 05/15/22   Fleming, Zelda W, NP  topiramate  (TOPAMAX ) 25 MG tablet Take 1-2 tablets (25-50 mg total) by mouth daily. 08/21/22   Collins Dean, NP                                                                                                                                    Past Surgical History Past Surgical History:  Procedure  Laterality Date   CHOLECYSTECTOMY N/A 11/10/2017   Procedure: LAPAROSCOPIC CHOLECYSTECTOMY WITH INTRAOPERATIVE CHOLANGIOGRAM;  Surgeon: Jacolyn Matar, MD;  Location: [facility]S;  Service: General;  Laterality: N/A;   Family History Family History  Problem Relation Age of Onset   Hypertension Mother    Breast cancer Sister     Social History Social History   Tobacco Use   Smoking status: Never   Smokeless tobacco: Never  Vaping Use   Vaping status: Never Used  Substance Use Topics   Alcohol use: No    Alcohol/week: 0.0 standard drinks of alcohol   Drug use: No   Allergies Droperidol  Review of Systems Review of Systems  Physical Exam Vital Signs  I have reviewed the triage vital signs BP (!) 145/81   Pulse 73   Temp 98 F (36.7 C) (Oral)   Resp 17   SpO2 97%   Physical Exam  Vitals reviewed.  Constitutional:      Appearance: She is well-developed.  Eyes:     Extraocular Movements: Extraocular movements intact.  Cardiovascular:     Rate and Rhythm: Normal rate.     Heart sounds: No murmur heard. Pulmonary:     Effort: Pulmonary effort is normal.  Musculoskeletal:        General: Normal range of motion.     Cervical back: Normal range of motion.  Neurological:     Mental Status: She is alert.     Cranial Nerves: No cranial nerve deficit.     Sensory: No sensory deficit.     Coordination: Romberg sign negative.     ED Results and Treatments Labs (all labs ordered are listed, but only abnormal results are displayed) Labs Reviewed  CBC WITH DIFFERENTIAL/PLATELET - Abnormal; Notable for the following components:      Result Value   RBC 5.51 (*)    Hemoglobin 16.1 (*)    HCT 48.3 (*)    All other components within normal limits  BASIC METABOLIC PANEL WITH GFR - Abnormal; Notable for the following components:   Potassium 3.3 (*)    Glucose, Bld 110 (*)    All other components within normal limits  HCG, SERUM, QUALITATIVE  TSH  TROPONIN I (HIGH  SENSITIVITY)                                                                                                                          Radiology CT Head Wo Contrast Result Date: 12/09/2023 CLINICAL DATA:  Altered mental status. EXAM: CT HEAD WITHOUT CONTRAST TECHNIQUE: Contiguous axial images were obtained from the base of the skull through the vertex without intravenous contrast. RADIATION DOSE REDUCTION: This exam was performed according to the departmental dose-optimization program which includes automated exposure control, adjustment of the mA and/or kV according to patient size and/or use of iterative reconstruction technique. COMPARISON:  May 02, 2011 FINDINGS: Brain: No evidence of acute infarction, hemorrhage, hydrocephalus, extra-axial collection or mass lesion/mass effect. Vascular: No hyperdense vessel or unexpected calcification. Skull: Normal. Negative for fracture or focal lesion. Sinuses/Orbits: No acute finding. Other: None. IMPRESSION: No acute intracranial pathology. Electronically Signed   By: Virgle Grime M.D.   On: 12/09/2023 19:03   DG Chest Portable 1 View Result Date: 12/09/2023 CLINICAL DATA:  Chest pain EXAM: PORTABLE CHEST 1 VIEW COMPARISON:  Chest x-ray 11/08/2017 FINDINGS: There is stable elevation of the right hemidiaphragm. The heart size and mediastinal contours are within normal limits. Both lungs are clear. The visualized skeletal structures are unremarkable. IMPRESSION: No active disease. Electronically Signed   By: Tyron Gallon M.D.   On: 12/09/2023 18:16    Pertinent labs & imaging results that were available during my care of the patient were reviewed by me and considered in my medical decision making (see MDM for details).  Medications Ordered in ED Medications  ketorolac  (TORADOL ) 15 MG/ML injection 15 mg (has no administration  in time range)  potassium chloride  SA (KLOR-CON  M) CR tablet 40 mEq (has no administration in time range)  acetaminophen   (TYLENOL ) tablet 1,000 mg (1,000 mg Oral Given 12/09/23 1651)  droperidol (INAPSINE) 2.5 MG/ML injection 1.25 mg (1.25 mg Intravenous Given 12/09/23 1651)  lactated ringers  bolus 1,000 mL (0 mLs Intravenous Stopped 12/09/23 1843)  diphenhydrAMINE (BENADRYL) injection 50 mg (50 mg Intravenous Given 12/09/23 1718)                                                                                                                                     Procedures Procedures  (including critical care time)  Medical Decision Making / ED Course   This patient presents to the ED for concern of headache, chest heaviness, finger tingling, this involves an extensive number of treatment options, and is a complaint that carries with it a high risk of complications and morbidity.  The differential diagnosis includes thyroid dysfunction, less likely ICH, less likely ACS, tension type headache.  MDM: Will check basic labs and the patient cleaning thyroid.  Overall her physical exam is reassuring.  She no neurological deficits.  Clear lung sounds, normal heart sounds.  Patient mildly hypertensive.  Lower suspicion for hypertensive urgency or emergency.  Will check for signs of endorgan dysfunction the patient.  Ordered droperidol for the patient, which unfortunately resulted in a dystonic reaction.  Benadryl provided with improvement in symptoms.  Reassessment 7:15 PM-my independent review the patient's head CT shows no intracranial hemorrhage.  My dependent review the patient's chest x-ray shows no pneumonia.  Patient responded well to Benadryl following dystonic reaction.  Headache is improved.  Will also provide some additional Toradol  for the patient.  Patient's blood pressure 140/80 now.  Will have her follow-up with her PCP to discuss management of her hypertension.  Repleted the patient's potassium.   Additional history obtained: -Additional history obtained from family at bedside -External records from  outside source obtained and reviewed including: Chart review including previous notes, labs, imaging, consultation notes   Lab Tests: -I ordered, reviewed, and interpreted labs.   The pertinent results include:   Labs Reviewed  CBC WITH DIFFERENTIAL/PLATELET - Abnormal; Notable for the following components:      Result Value   RBC 5.51 (*)    Hemoglobin 16.1 (*)    HCT 48.3 (*)    All other components within normal limits  BASIC METABOLIC PANEL WITH GFR - Abnormal; Notable for the following components:   Potassium 3.3 (*)    Glucose, Bld 110 (*)    All other components within normal limits  HCG, SERUM, QUALITATIVE  TSH  TROPONIN I (HIGH SENSITIVITY)      EKG my independent review of the patient's EKG shows no ST segment depressions or elevations, no T wave inversions, no evidence of acute ischemia.  EKG Interpretation Date/Time:  Thursday Dec 09 2023 15:52:26 EDT Ventricular Rate:  105  PR Interval:  145 QRS Duration:  81 QT Interval:  403 QTC Calculation: 533 R Axis:   35  Text Interpretation: Sinus tachycardia Low voltage, precordial leads Borderline T abnormalities, anterior leads Prolonged QT interval Confirmed by Afton Horse [phone]) on 12/09/2023 7:16:01 PM         Imaging Studies ordered: I ordered imaging studies including head CT, chest x-ray I independently visualized and interpreted imaging. I agree with the radiologist interpretation   Medicines ordered and prescription drug management: Meds ordered this encounter  Medications   DISCONTD: metoCLOPramide  (REGLAN ) injection 10 mg   acetaminophen  (TYLENOL ) tablet 1,000 mg   droperidol (INAPSINE) 2.5 MG/ML injection 1.25 mg   lactated ringers  bolus 1,000 mL   diphenhydrAMINE (BENADRYL) injection 50 mg   diphenhydrAMINE (BENADRYL) 50 MG/ML injection    Rosanna Comment C: cabinet override   ketorolac  (TORADOL ) 15 MG/ML injection 15 mg   potassium chloride  SA (KLOR-CON  M) CR tablet 40 mEq    -I have  reviewed the patients home medicines and have made adjustments as needed  Cardiac Monitoring: The patient was maintained on a cardiac monitor.  I personally viewed and interpreted the cardiac monitored which showed an underlying rhythm of: Normal sinus rhythm  Social Determinants of Health:  Factors impacting patients care include: Lack of access to primary care   Reevaluation: After the interventions noted above, I reevaluated the patient and found that they have :improved  Co morbidities that complicate the patient evaluation  Past Medical History:  Diagnosis Date   Hypertension    Migraine       Dispostion: I considered admission for this patient, however she is appropriate for outpatient follow-up.     Final Clinical Impression(s) / ED Diagnoses Final diagnoses:  Bad headache     @PCDICTATION @    Afton Horse T, DO 12/09/23 1919

## 2024-09-01 ENCOUNTER — Ambulatory Visit: Admitting: Sports Medicine
